# Patient Record
Sex: Female | Born: 1990 | Race: White | Hispanic: No | Marital: Single | Smoking: Never smoker
Health system: Southern US, Community
[De-identification: ages and names within clinical notes are randomized; demographics above are authoritative.]

## PROBLEM LIST (undated history)

## (undated) ENCOUNTER — Inpatient Hospital Stay (HOSPITAL_COMMUNITY): Payer: Self-pay

## (undated) DIAGNOSIS — N390 Urinary tract infection, site not specified: Secondary | ICD-10-CM

## (undated) DIAGNOSIS — F419 Anxiety disorder, unspecified: Secondary | ICD-10-CM

## (undated) DIAGNOSIS — D649 Anemia, unspecified: Secondary | ICD-10-CM

## (undated) DIAGNOSIS — F329 Major depressive disorder, single episode, unspecified: Secondary | ICD-10-CM

## (undated) DIAGNOSIS — F32A Depression, unspecified: Secondary | ICD-10-CM

## (undated) DIAGNOSIS — J45909 Unspecified asthma, uncomplicated: Secondary | ICD-10-CM

## (undated) HISTORY — PX: DILATION AND CURETTAGE OF UTERUS: SHX78

## (undated) HISTORY — DX: Depression, unspecified: F32.A

## (undated) HISTORY — PX: APPENDECTOMY: SHX54

---
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
## 2008-08-06 ENCOUNTER — Inpatient Hospital Stay (HOSPITAL_COMMUNITY): Admission: AD | Admit: 2008-08-06 | Discharge: 2008-08-08 | Payer: Self-pay | Admitting: Obstetrics and Gynecology

## 2008-08-06 ENCOUNTER — Inpatient Hospital Stay (HOSPITAL_COMMUNITY): Admission: AD | Admit: 2008-08-06 | Discharge: 2008-08-06 | Payer: Self-pay | Admitting: Obstetrics and Gynecology

## 2010-03-17 NOTE — L&D Delivery Note (Signed)
Delivery Note At 7:12 PM a viable and healthy female was delivered via Vaginal, Spontaneous Delivery (Presentation: Right Occiput Anterior).  APGAR: 8, 9; weight 7 lb 9 oz (3430 g).   Placenta status: Intact, Spontaneous.  Cord: 3 vessels with the following complications: nuchal cord x 1 reduced.  Anesthesia: Epidural Local  Episiotomy: None Lacerations: 2nd degree Suture Repair: 3.0 vicryl Est. Blood Loss (mL): 400  Mom to postpartum.  Baby to nursery-stable.  Pride Gonzales D 12/07/2010, 7:32 PM

## 2010-05-24 LAB — ABO/RH: RH Type: POSITIVE

## 2010-05-24 LAB — HEPATITIS B SURFACE ANTIGEN: Hepatitis B Surface Ag: NEGATIVE

## 2010-05-24 LAB — HIV ANTIBODY (ROUTINE TESTING W REFLEX): HIV: NONREACTIVE

## 2010-06-25 LAB — CBC
HCT: 25.3 % — ABNORMAL LOW (ref 36.0–46.0)
MCHC: 33 g/dL (ref 30.0–36.0)
MCV: 74.3 fL — ABNORMAL LOW (ref 78.0–100.0)
Platelets: 268 10*3/uL (ref 150–400)
Platelets: 333 10*3/uL (ref 150–400)
RDW: 17.9 % — ABNORMAL HIGH (ref 11.5–15.5)
RDW: 18 % — ABNORMAL HIGH (ref 11.5–15.5)
WBC: 14.1 10*3/uL — ABNORMAL HIGH (ref 4.0–10.5)

## 2010-06-25 LAB — RPR: RPR Ser Ql: NONREACTIVE

## 2010-08-02 NOTE — Discharge Summary (Signed)
NAMEREGGIE, WELGE               ACCOUNT NO.:  [id number]   MEDICAL RECORD NO.:  [id number]          PATIENT TYPE:  INP   LOCATION:  9136                          FACILITY:  [facility]   PHYSICIAN:  Zenaida Niece, M.D.DATE OF BIRTH:  06-14-1990   DATE OF ADMISSION:  08/06/2008  DATE OF DISCHARGE:  08/08/2008                               DISCHARGE SUMMARY   ADMISSION DIAGNOSIS:  Intrauterine pregnancy at 38 weeks.   DISCHARGE DIAGNOSIS:  Intrauterine pregnancy at 38 weeks.   PROCEDURE:  On May 24, she had a spontaneous vaginal delivery.   HISTORY AND PHYSICAL:  This is a 20 year old gravida 2, para 0-0-1-0  with an EGA of 38+ weeks, who presents with a complaint of regular  contractions.  She had been seen earlier in the day on May 23 for  contractions and the cervix was 1 cm dilated without change, and she was  sent home.  She returned a couple hours later with continued  contractions and cervix changed to 3 cm.  Prenatal care uncomplicated.   PRENATAL LABORATORY FINDINGS:  RPR nonreactive, hepatitis B surface  antigen negative, rubella immune, HIV negative, blood type is A+ with a  negative antibody screen, gonorrhea and chlamydia negative, cystic  fibrosis negative, 1-hour Glucola 98, group B strep is negative.   PAST MEDICAL HISTORY:  Significant for anemia and asthma and is  otherwise noncontributory.   PHYSICAL EXAMINATION:  She is afebrile with stable vital signs.  Fetal  heart tracing is category II with moderate variability and variable  decels with contractions and pushing with contractions every 2-3  minutes.  Abdomen, gravid and nontender with an estimated fetal weight  of 7-1/2 pounds.  Cervix on my first exam, she is complete complete and  +1.   HOSPITAL COURSE:  The patient was admitted and continued to progress on  her own.  She received an epidural and had spontaneous rupture of  membranes.  She progressed to complete, pushed well and early on the  morning of  May 24, had a vaginal delivery of a viable female infant with  Apgars of 8 and 9, weight 7 pounds 10 ounces.  Placenta delivered  spontaneous, was intact.  She had a right labial laceration and small  first-degree laceration repaired with 3-0 Vicryl with local block.  Left  labial laceration was hemostatic and not repaired.  Estimated blood loss  was less than 500 mL.  Postpartum, she had no significant complications.  On postpartum day #1, predelivery hemoglobin was 9.9, postdelivery was  8.3.  On postpartum day #1, the patient was requested to discharge home.  The baby was felt to be stable, so she was discharged home.   DISCHARGE INSTRUCTIONS:  Regular diet, pelvic rest.   FOLLOWUP:  In 6 weeks.   MEDICATIONS:  Over-the-counter ibuprofen as needed and Percocet #20 one  to two p.o. q.4-6 h. p.r.n. pain and over-the-counter iron b.i.d. and  she is given our discharge pamphlet.       Zenaida Niece, M.D.  Electronically Signed     TDM/MEDQ  D:  08/18/2008  T:  08/18/2008  Job:  [id number]

## 2010-09-26 LAB — STREP B DNA PROBE: GBS: NEGATIVE

## 2010-10-14 ENCOUNTER — Encounter (HOSPITAL_COMMUNITY): Payer: Self-pay | Admitting: *Deleted

## 2010-10-14 ENCOUNTER — Inpatient Hospital Stay (HOSPITAL_COMMUNITY)
Admission: AD | Admit: 2010-10-14 | Discharge: 2010-10-14 | Disposition: A | Discharge status: Home / Self Care | Payer: BC Managed Care – PPO | Source: Ambulatory Visit | Attending: Obstetrics and Gynecology | Admitting: Obstetrics and Gynecology

## 2010-10-14 DIAGNOSIS — O2340 Unspecified infection of urinary tract in pregnancy, unspecified trimester: Secondary | ICD-10-CM

## 2010-10-14 DIAGNOSIS — O479 False labor, unspecified: Secondary | ICD-10-CM

## 2010-10-14 DIAGNOSIS — R609 Edema, unspecified: Secondary | ICD-10-CM | POA: Insufficient documentation

## 2010-10-14 DIAGNOSIS — N390 Urinary tract infection, site not specified: Secondary | ICD-10-CM | POA: Insufficient documentation

## 2010-10-14 DIAGNOSIS — O9989 Other specified diseases and conditions complicating pregnancy, childbirth and the puerperium: Secondary | ICD-10-CM | POA: Insufficient documentation

## 2010-10-14 DIAGNOSIS — N898 Other specified noninflammatory disorders of vagina: Secondary | ICD-10-CM | POA: Insufficient documentation

## 2010-10-14 DIAGNOSIS — O4700 False labor before 37 completed weeks of gestation, unspecified trimester: Secondary | ICD-10-CM

## 2010-10-14 DIAGNOSIS — R300 Dysuria: Secondary | ICD-10-CM | POA: Insufficient documentation

## 2010-10-14 DIAGNOSIS — O2390 Unspecified genitourinary tract infection in pregnancy, unspecified trimester: Secondary | ICD-10-CM | POA: Insufficient documentation

## 2010-10-14 HISTORY — DX: Anemia, unspecified: D64.9

## 2010-10-14 LAB — URINALYSIS, ROUTINE W REFLEX MICROSCOPIC
Bilirubin Urine: NEGATIVE
Nitrite: NEGATIVE
Specific Gravity, Urine: 1.025 (ref 1.005–1.030)
Urobilinogen, UA: 0.2 mg/dL (ref 0.0–1.0)

## 2010-10-14 LAB — WET PREP, GENITAL
Trich, Wet Prep: NONE SEEN
Yeast Wet Prep HPF POC: NONE SEEN

## 2010-10-14 LAB — URINE MICROSCOPIC-ADD ON

## 2010-10-14 MED ORDER — NITROFURANTOIN MONOHYD MACRO 100 MG PO CAPS: 100.0000 mg | ORAL_CAPSULE | Freq: Two times a day (BID) | ORAL | Status: AC

## 2010-10-14 MED ORDER — NYSTATIN-TRIAMCINOLONE 100000-0.1 UNIT/GM-% EX CREA: TOPICAL_CREAM | Freq: Four times a day (QID) | CUTANEOUS | Status: DC

## 2010-10-14 MED ORDER — TERBUTALINE SULFATE 1 MG/ML IJ SOLN
0.2500 mg | Freq: Once | INTRAMUSCULAR | Status: AC
  Administered 2010-10-14: 0.25 mg via SUBCUTANEOUS
  Filled 2010-10-14: qty 1

## 2010-10-14 MED ORDER — ACETAMINOPHEN 500 MG PO TABS: 1000.0000 mg | ORAL_TABLET | Freq: Once | ORAL | Status: DC

## 2010-10-14 NOTE — ED Provider Notes (Cosign Needed)
Lauren Griffith is a 20 y.o. female at [redacted] weeks gestation presenting for contractions, external burning with urination, vaginal discharge and vulvar edema. Maternal Medical History:  Reason for admission: Reason for admission: contractions.  Contractions: Onset was yesterday.   Frequency: regular.   Duration is approximately 30 seconds.   Perceived severity is mild.    Fetal activity: Perceived fetal activity is normal.   Last perceived fetal movement was within the past hour.    Prenatal Complications - Diabetes: none.    OB History    Grav Para Term Preterm Abortions TAB SAB Ect Mult Living   3 1 1  1 1    1      Past Medical History  Diagnosis Date  . Anemia    Past Surgical History  Procedure Date  . Dilation and curettage of uterus    Family History: family history is not on file. Social History:  reports that she has never smoked. She has never used smokeless tobacco. She reports that she does not drink alcohol or use illicit drugs.  Review of Systems  Genitourinary: Positive for dysuria. Negative for urgency, frequency, hematuria and flank pain.      Blood pressure 103/63, pulse 93, temperature 98.1 F (36.7 C), temperature source Oral, resp. rate 20, height 5\' 2"  (1.575 m), weight 51.438 kg (113 lb 6.4 oz). Maternal Exam:  Uterine Assessment: Contraction strength is mild.  Contraction duration is 40 seconds. Contraction frequency is regular.   Abdomen: Fundal height is S=D.    Introitus: Vulva is positive for vulval edema. Vagina is positive for vaginal discharge and edema.  Pelvis: adequate for delivery.   Cervix: Cervix evaluated by sterile speculum exam and digital exam.   Closed and long  Fetal Exam Fetal Monitor Review: Mode: ultrasound.   Baseline rate: 145.  Variability: moderate (6-25 bpm).   Pattern: accelerations present and no decelerations.    Fetal State Assessment: Category I - tracings are normal.     Physical Exam  Constitutional:  She appears well-developed and well-nourished.  Cardiovascular: Normal rate.   Respiratory: Effort normal.  GI: Soft. There is no tenderness.  Genitourinary: Uterus normal. There is tenderness on the right labia. There is no lesion on the right labia. There is tenderness on the left labia. There is no lesion on the left labia. Uterus is not tender. Cervix exhibits discharge. Cervix exhibits no friability. There is erythema and tenderness around the vagina. No bleeding around the vagina. No signs of injury around the vagina. Vaginal discharge found.   UA: SG 1.025, mod LE, otherwise neg  Cervix: closed and long Prenatal labs: ABO, Rh:   Antibody:   Rubella:   RPR:    HBsAg:    HIV:    GBS:     Assessment/Plan: Assessment: 1. Preterm UC's w/ no cervical change 2. FHR category 1 3. Vulvar edema and irritation  4. Possible UTI  Plan: 1. Wet Prep, fFN, urine culture, terb 0.25 [place] per consult w/ Dr. Marlana Salvage, Hersey Maclellan 10/14/2010, 9:46 AM

## 2010-10-14 NOTE — Progress Notes (Signed)
Started have contractions last night at 1100. Noted swelling 'down there' yesterday, burns really bad when urinates.  Uterine contractions have stopped, but still has back pain

## 2010-10-14 NOTE — Progress Notes (Signed)
Pt states thinks she has uti, burns and smells with voiding. Has yellow thick vaginal d/c, mild odor. +FM, denies bleeding.

## 2010-10-14 NOTE — ED Provider Notes (Cosign Needed)
fFN neg Microscopic wet-mount exam shows many WBC's UC's rare, mild after terb  D/C home per consult w/ Dr. Ambrose Mantle Rx Macrobid and Micolog II

## 2010-10-15 LAB — URINE CULTURE: Special Requests: NORMAL

## 2010-11-25 ENCOUNTER — Encounter (HOSPITAL_COMMUNITY): Payer: Self-pay | Admitting: *Deleted

## 2010-11-25 ENCOUNTER — Inpatient Hospital Stay (HOSPITAL_COMMUNITY)
Admission: AD | Admit: 2010-11-25 | Discharge: 2010-11-25 | Disposition: A | Discharge status: Home / Self Care | Payer: BC Managed Care – PPO | Source: Ambulatory Visit | Attending: Obstetrics and Gynecology | Admitting: Obstetrics and Gynecology

## 2010-11-25 DIAGNOSIS — O4700 False labor before 37 completed weeks of gestation, unspecified trimester: Secondary | ICD-10-CM | POA: Insufficient documentation

## 2010-11-25 NOTE — ED Provider Notes (Signed)
SSE performed secondary to ? ROM  Negative pooling, Neg fern  Cvx: 1.5/50/vtx/-2, no fluid return with exam  FHR: 130, mod variability, +15x15 Toco: 2-5 mins, mild to palp  Caron Ode E. 11/24/10 @ 0750

## 2010-11-25 NOTE — Progress Notes (Signed)
Started 0230 ctx's 4-6, no bleeding, fluid coming out

## 2010-12-02 ENCOUNTER — Encounter (HOSPITAL_COMMUNITY): Payer: Self-pay | Admitting: *Deleted

## 2010-12-02 ENCOUNTER — Inpatient Hospital Stay (HOSPITAL_COMMUNITY)
Admission: AD | Admit: 2010-12-02 | Discharge: 2010-12-02 | Disposition: A | Discharge status: Home / Self Care | Payer: BC Managed Care – PPO | Source: Ambulatory Visit | Attending: Obstetrics and Gynecology | Admitting: Obstetrics and Gynecology

## 2010-12-02 DIAGNOSIS — O479 False labor, unspecified: Secondary | ICD-10-CM | POA: Insufficient documentation

## 2010-12-02 DIAGNOSIS — O471 False labor at or after 37 completed weeks of gestation: Secondary | ICD-10-CM

## 2010-12-02 HISTORY — DX: Urinary tract infection, site not specified: N39.0

## 2010-12-02 NOTE — Progress Notes (Signed)
Has a strange pain started last night, rt side upper abd- uterus, where baby  Is, worse with some movement.  Has had bloody mucous since last Mon. ? Watery d/c mid week- pt states when changes pad, it is really wet.   Contractions are inconsistant, any where from a 2 to a 10

## 2010-12-02 NOTE — ED Provider Notes (Signed)
Lauren Griffith is a 20 y.o. G3P1011 at @37  wks  Chief Complaint  Patient presents with  . Contractions   Subjective:  Patient is here for labor check and reports having wetness in her underwear for the past several days. Denies any gush of fluid. At her last visit cervix exam was 2 cm dilated. She has had some bloody show. Good fetal movement. No urinary symptoms.  Pregnancy course uncomplicated. Past Medical History  Diagnosis Date  . Anemia   . Urinary tract infection    Past Surgical History  Procedure Date  . Dilation and curettage of uterus    History   Social History  . Marital Status: Married    Spouse Name: N/A    Number of Children: N/A  . Years of Education: N/A   Occupational History  . Not on file.   Social History Main Topics  . Smoking status: Never Smoker   . Smokeless tobacco: Never Used  . Alcohol Use: No  . Drug Use: No  . Sexually Active: Yes   Other Topics Concern  . Not on file   Social History Narrative  . No narrative on file   Filed Vitals:   12/02/10 1139  BP: 114/66  Pulse: 80  Temp:   Resp: 18    Electronic fetal monitor: Contractions q. 3-6 minutes mild irregular. Fetal heart rate 120 baseline reactive moderate variability and no decelerations.  Abdomen soft and nontender  Pelvic: Normal external female genitalia: Vagina with physiologic white discharge Fern test is negative. Vaginal exam: Posterior 2/50/-2  Assessment:  G2 P1 at 37 weeks with prodromal contractions not in established labor. Fetal heart rate reassuring. No evidence of ruptured membranes.  Plan:  Discharge home with labor precautions

## 2010-12-02 NOTE — Progress Notes (Signed)
Pt states she was having contractions every 6 minutes before arriving. Now have spaced out. No leaking but has had bloody show for one week. Reports good fetal movement.

## 2010-12-07 ENCOUNTER — Encounter (HOSPITAL_COMMUNITY): Payer: Self-pay | Admitting: *Deleted

## 2010-12-07 ENCOUNTER — Inpatient Hospital Stay (HOSPITAL_COMMUNITY): Payer: BC Managed Care – PPO | Admitting: Anesthesiology

## 2010-12-07 ENCOUNTER — Inpatient Hospital Stay (HOSPITAL_COMMUNITY)
Admission: AD | Admit: 2010-12-07 | Discharge: 2010-12-09 | DRG: 373 | Disposition: A | Discharge status: Home / Self Care | Payer: BC Managed Care – PPO | Source: Ambulatory Visit | Attending: Obstetrics and Gynecology | Admitting: Obstetrics and Gynecology

## 2010-12-07 ENCOUNTER — Encounter (HOSPITAL_COMMUNITY): Payer: Self-pay | Admitting: Anesthesiology

## 2010-12-07 DIAGNOSIS — IMO0001 Reserved for inherently not codable concepts without codable children: Secondary | ICD-10-CM

## 2010-12-07 LAB — CBC
MCH: 20.9 pg — ABNORMAL LOW (ref 26.0–34.0)
MCHC: 29.2 g/dL — ABNORMAL LOW (ref 30.0–36.0)
Platelets: 240 10*3/uL (ref 150–400)

## 2010-12-07 MED ORDER — OXYCODONE-ACETAMINOPHEN 5-325 MG/5ML PO SOLN
5.0000 mL | ORAL | Status: DC | PRN
  Administered 2010-12-07: 5 mL via ORAL
  Filled 2010-12-07: qty 5

## 2010-12-07 MED ORDER — OXYCODONE-ACETAMINOPHEN 5-325 MG PO TABS: 2.0000 | ORAL_TABLET | ORAL | Status: DC | PRN

## 2010-12-07 MED ORDER — PHENYLEPHRINE 40 MCG/ML (10ML) SYRINGE FOR IV PUSH (FOR BLOOD PRESSURE SUPPORT)
80.0000 ug | PREFILLED_SYRINGE | INTRAVENOUS | Status: DC | PRN
  Filled 2010-12-07: qty 5

## 2010-12-07 MED ORDER — LIDOCAINE HCL (PF) 1 % IJ SOLN
INTRAMUSCULAR | Status: AC
  Filled 2010-12-07: qty 30

## 2010-12-07 MED ORDER — CITRIC ACID-SODIUM CITRATE 334-500 MG/5ML PO SOLN: 30.0000 mL | ORAL | Status: DC | PRN

## 2010-12-07 MED ORDER — OXYTOCIN 20 UNITS IN LACTATED RINGERS INFUSION - SIMPLE
INTRAVENOUS | Status: AC
  Filled 2010-12-07: qty 1000

## 2010-12-07 MED ORDER — ONDANSETRON HCL 4 MG/2ML IJ SOLN: 4.0000 mg | Freq: Four times a day (QID) | INTRAMUSCULAR | Status: DC | PRN

## 2010-12-07 MED ORDER — LIDOCAINE HCL 1.5 % IJ SOLN
INTRAMUSCULAR | Status: DC | PRN
  Administered 2010-12-07: 2 mL via EPIDURAL
  Administered 2010-12-07 (×2): 5 mL via EPIDURAL

## 2010-12-07 MED ORDER — FENTANYL 2.5 MCG/ML BUPIVACAINE 1/10 % EPIDURAL INFUSION (WH - ANES)
14.0000 mL/h | INTRAMUSCULAR | Status: DC
  Administered 2010-12-07: 14 mL/h via EPIDURAL

## 2010-12-07 MED ORDER — LACTATED RINGERS IV SOLN: 500.0000 mL | INTRAVENOUS | Status: DC | PRN

## 2010-12-07 MED ORDER — LACTATED RINGERS IV SOLN: 500.0000 mL | Freq: Once | INTRAVENOUS | Status: DC

## 2010-12-07 MED ORDER — IBUPROFEN 100 MG/5ML PO SUSP
600.0000 mg | Freq: Four times a day (QID) | ORAL | Status: DC
  Administered 2010-12-07: 600 mg via ORAL
  Filled 2010-12-07 (×8): qty 30

## 2010-12-07 MED ORDER — PHENYLEPHRINE 40 MCG/ML (10ML) SYRINGE FOR IV PUSH (FOR BLOOD PRESSURE SUPPORT)
PREFILLED_SYRINGE | INTRAVENOUS | Status: AC
  Filled 2010-12-07: qty 5

## 2010-12-07 MED ORDER — LIDOCAINE HCL (PF) 1 % IJ SOLN: 30.0000 mL | INTRAMUSCULAR | Status: DC | PRN

## 2010-12-07 MED ORDER — OXYTOCIN BOLUS FROM INFUSION
500.0000 mL | Freq: Once | INTRAVENOUS | Status: DC
  Filled 2010-12-07: qty 500

## 2010-12-07 MED ORDER — EPHEDRINE 5 MG/ML INJ
10.0000 mg | INTRAVENOUS | Status: DC | PRN
  Filled 2010-12-07: qty 4

## 2010-12-07 MED ORDER — IBUPROFEN 600 MG PO TABS: 600.0000 mg | ORAL_TABLET | Freq: Four times a day (QID) | ORAL | Status: DC | PRN

## 2010-12-07 MED ORDER — DIPHENHYDRAMINE HCL 50 MG/ML IJ SOLN: 12.5000 mg | INTRAMUSCULAR | Status: DC | PRN

## 2010-12-07 MED ORDER — ACETAMINOPHEN 325 MG PO TABS: 650.0000 mg | ORAL_TABLET | ORAL | Status: DC | PRN

## 2010-12-07 MED ORDER — LACTATED RINGERS IV SOLN: INTRAVENOUS | Status: DC

## 2010-12-07 MED ORDER — EPHEDRINE 5 MG/ML INJ
INTRAVENOUS | Status: AC
  Filled 2010-12-07: qty 4

## 2010-12-07 MED ORDER — LACTATED RINGERS IV BOLUS (SEPSIS)
1000.0000 mL | Freq: Once | INTRAVENOUS | Status: AC
  Administered 2010-12-07: 1000 mL via INTRAVENOUS

## 2010-12-07 MED ORDER — FENTANYL 2.5 MCG/ML BUPIVACAINE 1/10 % EPIDURAL INFUSION (WH - ANES)
INTRAMUSCULAR | Status: AC
  Filled 2010-12-07: qty 60

## 2010-12-07 MED ORDER — OXYTOCIN 20 UNITS IN LACTATED RINGERS INFUSION - SIMPLE
125.0000 mL/h | Freq: Once | INTRAVENOUS | Status: DC
  Administered 2010-12-07: 125 mL/h via INTRAVENOUS

## 2010-12-07 NOTE — Progress Notes (Signed)
Contractions since last night, intense over past 2 hours, G2P1 37 weeks,

## 2010-12-07 NOTE — Anesthesia Procedure Notes (Signed)
Epidural Patient location during procedure: OB Start time: 12/07/2010 5:49 PM Reason for block: procedure for pain  Staffing Performed by: anesthesiologist   Preanesthetic Checklist Completed: patient identified, site marked, surgical consent, pre-op evaluation, timeout performed, IV checked, risks and benefits discussed and monitors and equipment checked  Epidural Patient position: sitting Prep: site prepped and draped and DuraPrep Patient monitoring: continuous pulse ox and blood pressure Approach: midline Injection technique: LOR air  Needle:  Needle type: Tuohy  Needle gauge: 17 G Needle length: 9 cm Catheter type: closed end flexible Catheter size: 19 Gauge Test dose: negative  Assessment Events: blood not aspirated, injection not painful, no injection resistance, negative IV test and no paresthesia  Additional Notes Patient presents in rapid labor, 8 cm dilated.

## 2010-12-07 NOTE — Progress Notes (Signed)
VE- Rim/C/0, vtx, AROM clear Will start pushing soon.

## 2010-12-07 NOTE — Anesthesia Preprocedure Evaluation (Signed)
Anesthesia Evaluation  Name, MR# and DOB Patient awake  General Assessment Comment  Reviewed: Allergy & Precautions, H&P , NPO status , Patient's Chart, lab work & pertinent test results, reviewed documented beta blocker date and time   History of Anesthesia Complications Negative for: history of anesthetic complications  Airway Mallampati: II TM Distance: >3 FB Neck ROM: full    Dental  (+) Teeth Intact   Pulmonary  clear to auscultation  breath sounds clear to auscultation none    Cardiovascular regular Normal    Neuro/Psych Negative Neurological ROS  Negative Psych ROS  GI/Hepatic/Renal negative GI ROS  negative Liver ROS  negative Renal ROS        Endo/Other  Negative Endocrine ROS (+)      Abdominal   Musculoskeletal   Hematology negative hematology ROS (+)   Peds  Reproductive/Obstetrics (+) Pregnancy    Anesthesia Other Findings             Anesthesia Physical Anesthesia Plan  ASA: II  Anesthesia Plan: Epidural   Post-op Pain Management:    Induction:   Airway Management Planned:   Additional Equipment:   Intra-op Plan:   Post-operative Plan:   Informed Consent: I have reviewed the patients History and Physical, chart, labs and discussed the procedure including the risks, benefits and alternatives for the proposed anesthesia with the patient or authorized representative who has indicated his/her understanding and acceptance.     Plan Discussed with:   Anesthesia Plan Comments:         Anesthesia Quick Evaluation  

## 2010-12-07 NOTE — H&P (Signed)
Lauren Griffith is a 20 y.o. female presenting for regular ctx.  On evaluation in MAU, cervix changed from 2-3 cm to 4 cm, and then to 7 cm once she got to L&D.  PNC essentially uncomplicated, see prenatal records for complete history.  Maternal Medical History:  Reason for admission: Reason for admission: contractions.  Contractions: Frequency: regular.   Perceived severity is strong.    Fetal activity: Perceived fetal activity is normal.    Prenatal complications: no prenatal complications   OB History    Grav Para Term Preterm Abortions TAB SAB Ect Mult Living   3 1 1  1 1    1      Past Medical History  Diagnosis Date  . Anemia   . Urinary tract infection    Past Surgical History  Procedure Date  . Dilation and curettage of uterus    Family History: family history is not on file. Social History:  reports that she has never smoked. She has never used smokeless tobacco. She reports that she does not drink alcohol or use illicit drugs.  Review of Systems  Respiratory: Negative.   Cardiovascular: Negative.     Dilation: 8 Effacement (%): 100 Station: 0 Exam by:: m early rnc-ob Blood pressure 133/71, pulse 126, temperature 97.1 F (36.2 C), temperature source Oral, resp. rate 20, height 5\' 2"  (1.575 m), weight 56.246 kg (124 lb), SpO2 100.00%.  FHT- Category I with ctx q 3 minutes  Maternal Exam:  Uterine Assessment: Contraction strength is firm.  Contraction frequency is regular.   Abdomen: Patient reports no abdominal tenderness. Estimated fetal weight is 7 1/2 lbs.   Fetal presentation: vertex  Introitus: Normal vulva. Normal vagina.    Physical Exam  Constitutional: She appears well-developed and well-nourished.  Neck: Neck supple. No thyromegaly present.  Cardiovascular: Normal rate, regular rhythm and normal heart sounds.   No murmur heard. Respiratory: Breath sounds normal. No respiratory distress.  GI: Soft.    Prenatal labs: ABO, Rh: A/Positive/--  (03/09 0000) Antibody: Negative (03/09 0000) Rubella:  Immune RPR: Nonreactive (03/09 0000)  HBsAg: Negative (03/09 0000)  HIV: Non-reactive (03/09 0000)  GBS: Negative (07/12 0000)   Assessment/Plan: IUP at 37 weeks in active labor.  Receiving epidural, anticipate SVD.     Rain Wilhide D 12/07/2010, 6:08 PM

## 2010-12-08 MED ORDER — PRENATAL PLUS 27-1 MG PO TABS: 1.0000 | ORAL_TABLET | Freq: Every day | ORAL | Status: DC

## 2010-12-08 MED ORDER — DIPHENHYDRAMINE HCL 25 MG PO CAPS: 25.0000 mg | ORAL_CAPSULE | Freq: Four times a day (QID) | ORAL | Status: DC | PRN

## 2010-12-08 MED ORDER — LANOLIN HYDROUS EX OINT: TOPICAL_OINTMENT | CUTANEOUS | Status: DC | PRN

## 2010-12-08 MED ORDER — DIBUCAINE 1 % RE OINT: 1.0000 "application " | TOPICAL_OINTMENT | RECTAL | Status: DC | PRN

## 2010-12-08 MED ORDER — TETANUS-DIPHTH-ACELL PERTUSSIS 5-2.5-18.5 LF-MCG/0.5 IM SUSP: 0.5000 mL | Freq: Once | INTRAMUSCULAR | Status: DC

## 2010-12-08 MED ORDER — SENNOSIDES-DOCUSATE SODIUM 8.6-50 MG PO TABS
2.0000 | ORAL_TABLET | Freq: Every day | ORAL | Status: DC
  Administered 2010-12-08 (×2): 2 via ORAL

## 2010-12-08 MED ORDER — BENZOCAINE-MENTHOL 20-0.5 % EX AERO
1.0000 "application " | INHALATION_SPRAY | CUTANEOUS | Status: DC | PRN
  Administered 2010-12-08: 1 via TOPICAL

## 2010-12-08 MED ORDER — ONDANSETRON HCL 4 MG PO TABS: 4.0000 mg | ORAL_TABLET | ORAL | Status: DC | PRN

## 2010-12-08 MED ORDER — MAGNESIUM HYDROXIDE 400 MG/5ML PO SUSP: 30.0000 mL | ORAL | Status: DC | PRN

## 2010-12-08 MED ORDER — BENZOCAINE-MENTHOL 20-0.5 % EX AERO
INHALATION_SPRAY | CUTANEOUS | Status: AC
  Administered 2010-12-08: 1 via TOPICAL
  Filled 2010-12-08: qty 56

## 2010-12-08 MED ORDER — MEASLES, MUMPS & RUBELLA VAC ~~LOC~~ INJ
0.5000 mL | INJECTION | Freq: Once | SUBCUTANEOUS | Status: DC
  Filled 2010-12-08: qty 0.5

## 2010-12-08 MED ORDER — WITCH HAZEL-GLYCERIN EX PADS: 1.0000 "application " | MEDICATED_PAD | CUTANEOUS | Status: DC | PRN

## 2010-12-08 MED ORDER — OXYCODONE-ACETAMINOPHEN 5-325 MG PO TABS
1.0000 | ORAL_TABLET | ORAL | Status: DC | PRN
  Administered 2010-12-08 – 2010-12-09 (×7): 1 via ORAL
  Filled 2010-12-08 (×7): qty 1

## 2010-12-08 MED ORDER — IBUPROFEN 600 MG PO TABS
600.0000 mg | ORAL_TABLET | Freq: Four times a day (QID) | ORAL | Status: DC
  Administered 2010-12-08 – 2010-12-09 (×6): 600 mg via ORAL
  Filled 2010-12-08 (×6): qty 1

## 2010-12-08 MED ORDER — PRENATAL PLUS 27-1 MG PO TABS
1.0000 | ORAL_TABLET | Freq: Every day | ORAL | Status: DC
  Administered 2010-12-08 – 2010-12-09 (×2): 1 via ORAL
  Filled 2010-12-08 (×2): qty 1

## 2010-12-08 MED ORDER — ONDANSETRON HCL 4 MG/2ML IJ SOLN: 4.0000 mg | INTRAMUSCULAR | Status: DC | PRN

## 2010-12-08 MED ORDER — METHYLERGONOVINE MALEATE 0.2 MG PO TABS: 0.2000 mg | ORAL_TABLET | ORAL | Status: DC | PRN

## 2010-12-08 MED ORDER — ZOLPIDEM TARTRATE 5 MG PO TABS: 5.0000 mg | ORAL_TABLET | Freq: Every evening | ORAL | Status: DC | PRN

## 2010-12-08 MED ORDER — SIMETHICONE 80 MG PO CHEW: 80.0000 mg | CHEWABLE_TABLET | ORAL | Status: DC | PRN

## 2010-12-08 MED ORDER — METHYLERGONOVINE MALEATE 0.2 MG/ML IJ SOLN: 0.2000 mg | INTRAMUSCULAR | Status: DC | PRN

## 2010-12-08 NOTE — Progress Notes (Signed)
PPD#1 Doing well Afeb, VSS Fundus- firm, NT at U-1 Continue routine postpartum care

## 2010-12-08 NOTE — Anesthesia Postprocedure Evaluation (Signed)
Anesthesia Post Note  Patient: Lauren Griffith [facility]  Procedure(s) Performed: * No procedures listed *  Anesthesia type: Epidural  Patient location: Mother/Baby  Post pain: Pain level controlled  Post assessment: Post-op Vital signs reviewed  Last Vitals:  Filed Vitals:   12/08/10 0430  BP: 88/56  Pulse: 79  Temp: 99 F (37.2 C)  Resp: 18    Post vital signs: Reviewed  Level of consciousness: awake  Complications: No apparent anesthesia complications

## 2010-12-09 MED ORDER — OXYCODONE-ACETAMINOPHEN 5-325 MG PO TABS: 1.0000 | ORAL_TABLET | ORAL | Status: AC | PRN

## 2010-12-09 NOTE — Progress Notes (Signed)
PPD#2 A little more sore today, ready to go home Afeb, VSS Fundus- firm, NT Discharge home

## 2010-12-09 NOTE — Discharge Summary (Signed)
Obstetric Discharge Summary Reason for Admission: onset of labor Prenatal Procedures: none Intrapartum Procedures: spontaneous vaginal delivery Postpartum Procedures: none Complications-Operative and Postpartum: 2nd degree perineal laceration Hemoglobin  Date Value Range Status  12/07/2010 8.2* 12.0-15.0 (g/dL) Final     HCT  Date Value Range Status  12/07/2010 28.1* 36.0-46.0 (%) Final    Discharge Diagnoses: Term Pregnancy-delivered  Discharge Information: Date: 12/09/2010 Activity: pelvic rest Diet: routine Medications: Ibuprofen and Percocet Condition: stable Instructions: refer to practice specific booklet Discharge to: home Follow-up Information    Follow up with Jericho Alcorn D. Make an appointment in 6 weeks.   Contact information:   [address] [place] [address] [phone]          Newborn Data: Live born female  Birth Weight: 7 lb 9 oz (3430 g) APGAR: 8, 9  Home with mother.  Nayquan Evinger D 12/09/2010, 8:41 AM

## 2012-10-11 DIAGNOSIS — F419 Anxiety disorder, unspecified: Secondary | ICD-10-CM | POA: Insufficient documentation

## 2013-01-10 DIAGNOSIS — J302 Other seasonal allergic rhinitis: Secondary | ICD-10-CM | POA: Insufficient documentation

## 2013-03-17 NOTE — L&D Delivery Note (Signed)
Delivery Note At 2:47 AM a viable and healthy female was delivered via Vaginal, Spontaneous Delivery (Presentation: Right Occiput Anterior).  APGAR: 8 , 9; weight pending.   Placenta status: Intact, Spontaneous.  Cord: 3 vessels   Anesthesia: Epidural  Episiotomy: None Lacerations: 2nd degree;Perineal Suture Repair: 3.0 vicryl Est. Blood Loss (mL): 250 cc  Mom to postpartum.  Baby to Couplet care / Skin to Skin.  Suezette Lafave H. 10/24/2013, 3:10 AM

## 2013-03-30 LAB — OB RESULTS CONSOLE GC/CHLAMYDIA
Chlamydia: NEGATIVE
GC PROBE AMP, GENITAL: NEGATIVE

## 2013-04-18 LAB — OB RESULTS CONSOLE RUBELLA ANTIBODY, IGM: Rubella: IMMUNE

## 2013-04-18 LAB — OB RESULTS CONSOLE ANTIBODY SCREEN: ANTIBODY SCREEN: NEGATIVE

## 2013-04-18 LAB — OB RESULTS CONSOLE RPR: RPR: NONREACTIVE

## 2013-04-18 LAB — OB RESULTS CONSOLE ABO/RH: RH Type: POSITIVE

## 2013-04-18 LAB — OB RESULTS CONSOLE HIV ANTIBODY (ROUTINE TESTING): HIV: NONREACTIVE

## 2013-04-18 LAB — OB RESULTS CONSOLE HEPATITIS B SURFACE ANTIGEN: Hepatitis B Surface Ag: NEGATIVE

## 2013-05-17 ENCOUNTER — Encounter (HOSPITAL_COMMUNITY): Payer: Self-pay | Admitting: Emergency Medicine

## 2013-05-17 ENCOUNTER — Inpatient Hospital Stay (HOSPITAL_COMMUNITY)
Admission: AD | Admit: 2013-05-17 | Discharge: 2013-05-17 | Disposition: A | Discharge status: Home / Self Care | Payer: BC Managed Care – PPO | Source: Ambulatory Visit | Attending: Obstetrics & Gynecology | Admitting: Obstetrics & Gynecology

## 2013-05-17 ENCOUNTER — Emergency Department (HOSPITAL_COMMUNITY)
Admission: EM | Admit: 2013-05-17 | Discharge: 2013-05-17 | Discharge status: Against Medical Advice | Payer: BC Managed Care – PPO | Source: Home / Self Care

## 2013-05-17 ENCOUNTER — Encounter (HOSPITAL_COMMUNITY): Payer: Self-pay | Admitting: *Deleted

## 2013-05-17 DIAGNOSIS — T50905A Adverse effect of unspecified drugs, medicaments and biological substances, initial encounter: Secondary | ICD-10-CM

## 2013-05-17 DIAGNOSIS — T887XXA Unspecified adverse effect of drug or medicament, initial encounter: Secondary | ICD-10-CM

## 2013-05-17 DIAGNOSIS — O9989 Other specified diseases and conditions complicating pregnancy, childbirth and the puerperium: Secondary | ICD-10-CM | POA: Insufficient documentation

## 2013-05-17 DIAGNOSIS — O219 Vomiting of pregnancy, unspecified: Secondary | ICD-10-CM

## 2013-05-17 DIAGNOSIS — J019 Acute sinusitis, unspecified: Secondary | ICD-10-CM

## 2013-05-17 DIAGNOSIS — J329 Chronic sinusitis, unspecified: Secondary | ICD-10-CM | POA: Insufficient documentation

## 2013-05-17 DIAGNOSIS — O210 Mild hyperemesis gravidarum: Secondary | ICD-10-CM

## 2013-05-17 DIAGNOSIS — O99891 Other specified diseases and conditions complicating pregnancy: Secondary | ICD-10-CM | POA: Insufficient documentation

## 2013-05-17 LAB — URINALYSIS, ROUTINE W REFLEX MICROSCOPIC
Bilirubin Urine: NEGATIVE
GLUCOSE, UA: NEGATIVE mg/dL
Hgb urine dipstick: NEGATIVE
KETONES UR: 15 mg/dL — AB
NITRITE: NEGATIVE
PH: 6.5 (ref 5.0–8.0)
Protein, ur: NEGATIVE mg/dL
Specific Gravity, Urine: 1.02 (ref 1.005–1.030)
Urobilinogen, UA: 0.2 mg/dL (ref 0.0–1.0)

## 2013-05-17 LAB — URINE MICROSCOPIC-ADD ON

## 2013-05-17 MED ORDER — AZITHROMYCIN 250 MG PO TABS: ORAL_TABLET | ORAL | Status: AC

## 2013-05-17 MED ORDER — ONDANSETRON 8 MG PO TBDP
8.0000 mg | ORAL_TABLET | Freq: Once | ORAL | Status: AC
  Administered 2013-05-17: 8 mg via ORAL
  Filled 2013-05-17: qty 1

## 2013-05-17 MED ORDER — ONDANSETRON 4 MG PO TBDP: 8.0000 mg | ORAL_TABLET | Freq: Once | ORAL | Status: DC

## 2013-05-17 NOTE — MAU Provider Note (Signed)
Chief Complaint: Emesis  First Provider Initiated Contact with Patient 05/17/13 2035     SUBJECTIVE HPI: Lauren Griffith is a 23 y.o. [id number] at [redacted]w[redacted]d by LMP who presents with N and vomiting ~6 x since last night. Had mild N/V pregnancy earlier in pregnancy, but none x a few weeks. Started Amox last night for sinus infection that started 4 days ago. No Hx of similar reaction to Amox in past, but never too high (1000 mg) dose before. Denies fever, chills, diarrhea. abd pain, VB, sick contacts. Able to keep down fluids, but no food today. Has Phenergan and Zofran at home, but did not take.   Past Medical History  Diagnosis Date  . Anemia   . Urinary tract infection    OB History  Gravida Para Term Preterm AB SAB TAB Ectopic Multiple Living  4 2 2  1  1   2     # Outcome Date GA Lbr Len/2nd Weight Sex Delivery Anes PTL Lv  4 CUR           3 TRM 12/07/10 [redacted]w[redacted]d 01:54 / 01:10 3.43 kg (7 lb 9 oz) F SVD EPI  Y  2 TRM 08/07/08    F SVD EPI N   1 TAB              Past Surgical History  Procedure Laterality Date  . Dilation and curettage of uterus     History   Social History  . Marital Status: Married    Spouse Name: N/A    Number of Children: N/A  . Years of Education: N/A   Occupational History  . Not on file.   Social History Main Topics  . Smoking status: Never Smoker   . Smokeless tobacco: Never Used  . Alcohol Use: No  . Drug Use: No  . Sexual Activity: Yes   Other Topics Concern  . Not on file   Social History Narrative  . No narrative on file   No current facility-administered medications on file prior to encounter.   Current Outpatient Prescriptions on File Prior to Encounter  Medication Sig Dispense Refill  . diphenhydrAMINE (BENADRYL) 25 mg capsule Take 50 mg by mouth every 6 (six) hours as needed for allergies or sleep. Allergies      . prenatal vitamin w/FE, FA (PRENATAL 1 + 1) 27-1 MG TABS Take 1 tablet by mouth daily.        Allergies  Allergen  Reactions  . Amoxicillin Nausea And Vomiting  . Cinnamon Swelling and Other (See Comments)    Mouth swelling. Pt states it is a minor allergy    ROS: Pertinent items in HPI  OBJECTIVE Blood pressure 103/67, pulse 82, temperature 97.7 F (36.5 C), temperature source Oral, resp. rate 18, height 5\' 3"  (1.6 m), weight 49.896 kg (110 lb), SpO2 98.00%, unknown if currently breastfeeding. GENERAL: Well-developed, well-nourished female in no acute distress.  HEENT: Normocephalic. Mucus membranes moist. Sinusus congested. Rhinorrhea present.  HEART: normal rate RESP: normal effort ABDOMEN: Soft, non-tender EXTREMITIES: Nontender, no edema NEURO: Alert and oriented SPECULUM EXAM: Deferred FHR 155 by doppler.   LAB RESULTS Results for orders placed during the hospital encounter of 05/17/13 (from the past 24 hour(s))  URINALYSIS, ROUTINE W REFLEX MICROSCOPIC     Status: Abnormal   Collection Time    05/17/13  7:50 PM      Result Value Ref Range   Color, Urine YELLOW  YELLOW   APPearance HAZY (*) CLEAR  Specific Gravity, Urine 1.020  1.005 - 1.030   pH 6.5  5.0 - 8.0   Glucose, UA NEGATIVE  NEGATIVE mg/dL   Hgb urine dipstick NEGATIVE  NEGATIVE   Bilirubin Urine NEGATIVE  NEGATIVE   Ketones, ur 15 (*) NEGATIVE mg/dL   Protein, ur NEGATIVE  NEGATIVE mg/dL   Urobilinogen, UA 0.2  0.0 - 1.0 mg/dL   Nitrite NEGATIVE  NEGATIVE   Leukocytes, UA TRACE (*) NEGATIVE  URINE MICROSCOPIC-ADD ON     Status: Abnormal   Collection Time    05/17/13  7:50 PM      Result Value Ref Range   Squamous Epithelial / LPF FEW (*) RARE   WBC, UA 0-2  <3 WBC/hpf   RBC / HPF 0-2  <3 RBC/hpf   Bacteria, UA FEW (*) RARE   Urine-Other MUCOUS PRESENT      IMAGING No results found.  MAU COURSE Zofran ODT given. Asking for Sprite. Recommend waiting for Zofran to work first.   Feeling much better. Tolerating POs. Will switch ABX to Z-pack per consutation w/ Dr Mora ApplPinn.   ASSESSMENT 1. Medication reaction    2. Vomiting complicating pregnancy, antepartum   3. Sinusitis, acute     PLAN Discharge home in stable condition per consult w/ Dr. Mora ApplPinn. Sinus infection comfort measures, Neti Pot, increase fluids and rest.      Follow-up Information   Follow up with Almon HerculesOSS,KENDRA H., MD In 1 week. (or , As needed, If symptoms worsen)    Specialty:  Obstetrics and Gynecology   Contact information:   [address] [place] [address] [phone]       [facility] with [facility]. (As needed in emergencies)    Contact information:   [address] [id number] [place] [address] [phone]       Medication List         azithromycin 250 MG tablet  Commonly known as:  ZITHROMAX Z-PAK  Take 2 tablets (500 mg) on  Day 1,  followed by 1 tablet (250 mg) once daily on Days 2 through 5.     citalopram 20 MG tablet  Commonly known as:  CELEXA  Take 20 mg by mouth daily.     diphenhydrAMINE 25 mg capsule  Commonly known as:  BENADRYL  Take 50 mg by mouth every 6 (six) hours as needed for allergies or sleep. Allergies     prenatal vitamin w/FE, FA 27-1 MG Tabs tablet  Take 1 tablet by mouth daily.     pseudoephedrine-acetaminophen 30-500 MG Tabs  Commonly known as:  TYLENOL SINUS  Take 2 tablets by mouth every 4 (four) hours as needed (congestion).       [place] Cherica Griffith, [place] 05/17/2013  10:38 PM

## 2013-05-17 NOTE — MAU Note (Signed)
Pt reports she was seen at MD office yesterday and given amoxicillin for sinus infection and began vomiting last pm.

## 2013-05-17 NOTE — Discharge Instructions (Signed)
Nausea and Vomiting Nausea is a sick feeling that often comes before throwing up (vomiting). Vomiting is a reflex where stomach contents come out of your mouth. Vomiting can cause severe loss of body fluids (dehydration). Children and elderly adults can become dehydrated quickly, especially if they also have diarrhea. Nausea and vomiting are symptoms of a condition or disease. It is important to find the cause of your symptoms. CAUSES   Direct irritation of the stomach lining. This irritation can result from increased acid production (gastroesophageal reflux disease), infection, food poisoning, taking certain medicines (such as nonsteroidal anti-inflammatory drugs), alcohol use, or tobacco use.  Signals from the brain.These signals could be caused by a headache, heat exposure, an inner ear disturbance, increased pressure in the brain from injury, infection, a tumor, or a concussion, pain, emotional stimulus, or metabolic problems.  An obstruction in the gastrointestinal tract (bowel obstruction).  Illnesses such as diabetes, hepatitis, gallbladder problems, appendicitis, kidney problems, cancer, sepsis, atypical symptoms of a heart attack, or eating disorders.  Medical treatments such as chemotherapy and radiation.  Receiving medicine that makes you sleep (general anesthetic) during surgery. DIAGNOSIS Your caregiver may ask for tests to be done if the problems do not improve after a few days. Tests may also be done if symptoms are severe or if the reason for the nausea and vomiting is not clear. Tests may include:  Urine tests.  Blood tests.  Stool tests.  Cultures (to look for evidence of infection).  X-rays or other imaging studies. Test results can help your caregiver make decisions about treatment or the need for additional tests. TREATMENT You need to stay well hydrated. Drink frequently but in small amounts.You may wish to drink water, sports drinks, clear broth, or eat frozen  ice pops or gelatin dessert to help stay hydrated.When you eat, eating slowly may help prevent nausea.There are also some antinausea medicines that may help prevent nausea. HOME CARE INSTRUCTIONS   Take all medicine as directed by your caregiver.  If you do not have an appetite, do not force yourself to eat. However, you must continue to drink fluids.  If you have an appetite, eat a normal diet unless your caregiver tells you differently.  Eat a variety of complex carbohydrates (rice, wheat, potatoes, bread), lean meats, yogurt, fruits, and vegetables.  Avoid high-fat foods because they are more difficult to digest.  Drink enough water and fluids to keep your urine clear or pale yellow.  If you are dehydrated, ask your caregiver for specific rehydration instructions. Signs of dehydration may include:  Severe thirst.  Dry lips and mouth.  Dizziness.  Dark urine.  Decreasing urine frequency and amount.  Confusion.  Rapid breathing or pulse. SEEK IMMEDIATE MEDICAL CARE IF:   You have blood or brown flecks (like coffee grounds) in your vomit.  You have black or bloody stools.  You have a severe headache or stiff neck.  You are confused.  You have severe abdominal pain.  You have chest pain or trouble breathing.  You do not urinate at least once every 8 hours.  You develop cold or clammy skin.  You continue to vomit for longer than 24 to 48 hours.  You have a fever. MAKE SURE YOU:   Understand these instructions.  Will watch your condition.  Will get help right away if you are not doing well or get worse. Document Released: 03/03/2005 Document Revised: 05/26/2011 Document Reviewed: 07/31/2010 [facility] Patient Information 2014 [place], [place].   Sinusitis Sinusitis is  redness, soreness, and swelling (inflammation) of the paranasal sinuses. Paranasal sinuses are air pockets within the bones of your face (beneath the eyes, the middle of the forehead, or above  the eyes). In healthy paranasal sinuses, mucus is able to drain out, and air is able to circulate through them by way of your nose. However, when your paranasal sinuses are inflamed, mucus and air can become trapped. This can allow bacteria and other germs to grow and cause infection. Sinusitis can develop quickly and last only a short time (acute) or continue over a long period (chronic). Sinusitis that lasts for more than 12 weeks is considered chronic.  CAUSES  Causes of sinusitis include:  Allergies.  Structural abnormalities, such as displacement of the cartilage that separates your nostrils (deviated septum), which can decrease the air flow through your nose and sinuses and affect sinus drainage.  Functional abnormalities, such as when the small hairs (cilia) that line your sinuses and help remove mucus do not work properly or are not present. SYMPTOMS  Symptoms of acute and chronic sinusitis are the same. The primary symptoms are pain and pressure around the affected sinuses. Other symptoms include:  Upper toothache.  Earache.  Headache.  Bad breath.  Decreased sense of smell and taste.  A cough, which worsens when you are lying flat.  Fatigue.  Fever.  Thick drainage from your nose, which often is green and may contain pus (purulent).  Swelling and warmth over the affected sinuses. DIAGNOSIS  Your caregiver will perform a physical exam. During the exam, your caregiver may:  Look in your nose for signs of abnormal growths in your nostrils (nasal polyps).  Tap over the affected sinus to check for signs of infection.  View the inside of your sinuses (endoscopy) with a special imaging device with a light attached (endoscope), which is inserted into your sinuses. If your caregiver suspects that you have chronic sinusitis, one or more of the following tests may be recommended:  Allergy tests.  Nasal culture A sample of mucus is taken from your nose and sent to a lab and  screened for bacteria.  Nasal cytology A sample of mucus is taken from your nose and examined by your caregiver to determine if your sinusitis is related to an allergy. TREATMENT  Most cases of acute sinusitis are related to a viral infection and will resolve on their own within 10 days. Sometimes medicines are prescribed to help relieve symptoms (pain medicine, decongestants, nasal steroid sprays, or saline sprays).  However, for sinusitis related to a bacterial infection, your caregiver will prescribe antibiotic medicines. These are medicines that will help kill the bacteria causing the infection.  Rarely, sinusitis is caused by a fungal infection. In theses cases, your caregiver will prescribe antifungal medicine. For some cases of chronic sinusitis, surgery is needed. Generally, these are cases in which sinusitis recurs more than 3 times per year, despite other treatments. HOME CARE INSTRUCTIONS   Drink plenty of water. Water helps thin the mucus so your sinuses can drain more easily.  Use a humidifier.  Inhale steam 3 to 4 times a day (for example, sit in the bathroom with the shower running).  Apply a warm, moist washcloth to your face 3 to 4 times a day, or as directed by your caregiver.  Use saline nasal sprays to help moisten and clean your sinuses.  Take over-the-counter or prescription medicines for pain, discomfort, or fever only as directed by your caregiver. SEEK IMMEDIATE MEDICAL CARE IF:  You have increasing pain or severe headaches.  You have nausea, vomiting, or drowsiness.  You have swelling around your face.  You have vision problems.  You have a stiff neck.  You have difficulty breathing. MAKE SURE YOU:   Understand these instructions.  Will watch your condition.  Will get help right away if you are not doing well or get worse. Document Released: 03/03/2005 Document Revised: 05/26/2011 Document Reviewed: 03/18/2011 [facility] Patient Information 2014  [place], [place].

## 2013-05-17 NOTE — MAU Note (Addendum)
PT SAYS SHE HAD SINUS INFECTION  - WENT TO DR ON Monday-  GAVE HER AMOXICILLIN  - SHE HAS TAKEN IT IN PAST WITHOUT TROUBLE  BUT THIS TIME SHE STARTED  VOMITING.    SINCE THEN  HAS VOMITED .     SHE  HAS ZOFRAN  AND PHENERGAN AT HOME  FROM MORNING SICKNESS.  DID NOT TAKE ANY.  SHE FEELS NAUSEA NOW.     PT SAYS SHE WENT TO [facility]  AT 5PM-  WAS NEVER SEEN- THEN SHE CALLED  HER DR.

## 2013-05-17 NOTE — ED Notes (Signed)
Pt reports that she just spoke with her MD and she was [facility] Her MD will meet her there.

## 2013-05-17 NOTE — ED Notes (Addendum)
Pt sts she was recently dx with a sinus infection and started on amoxicillin, ever since she started to take the amoxicillin she has been nauseous and started vomiting today. Reports she hasn't been able to keep anything down except mountain dew. Reports she called her PCP and OB they suggested she come to ED for further evaluation and possible dehydration. Pt is [redacted] weeks pregnant, last saw OB doctor 2 weeks ago & was told everything was good. Denies abd pain. Nad, skin warm and dry, resp e/u.

## 2013-05-17 NOTE — MAU Note (Signed)
PO ICE CHIPS  AND SPRITE

## 2013-05-19 NOTE — MAU Provider Note (Signed)
I agree with Henrene Pastoraove

## 2013-07-01 ENCOUNTER — Other Ambulatory Visit (HOSPITAL_COMMUNITY): Payer: Self-pay | Admitting: Obstetrics and Gynecology

## 2013-07-01 DIAGNOSIS — Q049 Congenital malformation of brain, unspecified: Secondary | ICD-10-CM

## 2013-07-06 ENCOUNTER — Ambulatory Visit (HOSPITAL_COMMUNITY)
Admission: RE | Admit: 2013-07-06 | Discharge: 2013-07-06 | Disposition: A | Discharge status: Home / Self Care | Payer: BC Managed Care – PPO | Source: Ambulatory Visit | Attending: Obstetrics and Gynecology | Admitting: Obstetrics and Gynecology

## 2013-07-06 ENCOUNTER — Encounter (HOSPITAL_COMMUNITY): Payer: Self-pay

## 2013-07-06 DIAGNOSIS — Q049 Congenital malformation of brain, unspecified: Secondary | ICD-10-CM

## 2013-07-06 DIAGNOSIS — O358XX0 Maternal care for other (suspected) fetal abnormality and damage, not applicable or unspecified: Secondary | ICD-10-CM | POA: Insufficient documentation

## 2013-07-06 NOTE — Progress Notes (Signed)
[facility] ultrasound  Indication: 23 yr old [id number] at 5276w4d for fetal anatomic survey. Finding of enlarged cisterna magna on outside ultrasound.  Findings: 1. Single intrauterine pregnancy. 2. Fetal biometry is consistent with dating. 3. Posterior placenta without evidence of previa. 4. Normal amniotic fluid volume. 5. Normal transabdominal cervical length. 6. The views of the nose/lips, right outflow, and palate are limited. 7. The remainder of the limited anatomy survey is normal. 8. The cisterna magna appears normal on today's ultrasound.  Recommendations: 1. Appropriate fetal growth. 2. Limited anatomy survey: - recommend follow up in 3-4 weeks to complete anatomic survey. 3. Patient reports normal cell free fetal DNA- we do not have these results  Eulis FosterKristen Lindsay Soulliere, MD

## 2013-07-07 ENCOUNTER — Other Ambulatory Visit (HOSPITAL_COMMUNITY): Payer: Self-pay | Admitting: Obstetrics and Gynecology

## 2013-07-07 DIAGNOSIS — O358XX0 Maternal care for other (suspected) fetal abnormality and damage, not applicable or unspecified: Secondary | ICD-10-CM

## 2013-08-01 ENCOUNTER — Encounter (HOSPITAL_COMMUNITY): Payer: Self-pay

## 2013-08-01 ENCOUNTER — Ambulatory Visit (HOSPITAL_COMMUNITY)
Admission: RE | Admit: 2013-08-01 | Discharge: 2013-08-01 | Disposition: A | Discharge status: Home / Self Care | Payer: BC Managed Care – PPO | Source: Ambulatory Visit | Attending: Obstetrics and Gynecology | Admitting: Obstetrics and Gynecology

## 2013-08-01 DIAGNOSIS — O358XX0 Maternal care for other (suspected) fetal abnormality and damage, not applicable or unspecified: Secondary | ICD-10-CM | POA: Insufficient documentation

## 2013-08-01 DIAGNOSIS — Z3689 Encounter for other specified antenatal screening: Secondary | ICD-10-CM | POA: Insufficient documentation

## 2013-08-14 ENCOUNTER — Inpatient Hospital Stay (HOSPITAL_COMMUNITY)
Admission: AD | Admit: 2013-08-14 | Discharge: 2013-08-14 | Disposition: A | Discharge status: Home / Self Care | Payer: BC Managed Care – PPO | Source: Ambulatory Visit | Attending: Obstetrics and Gynecology | Admitting: Obstetrics and Gynecology

## 2013-08-14 ENCOUNTER — Encounter (HOSPITAL_COMMUNITY): Payer: Self-pay | Admitting: *Deleted

## 2013-08-14 DIAGNOSIS — M62830 Muscle spasm of back: Secondary | ICD-10-CM

## 2013-08-14 DIAGNOSIS — O9989 Other specified diseases and conditions complicating pregnancy, childbirth and the puerperium: Principal | ICD-10-CM

## 2013-08-14 DIAGNOSIS — M549 Dorsalgia, unspecified: Secondary | ICD-10-CM | POA: Insufficient documentation

## 2013-08-14 DIAGNOSIS — O99891 Other specified diseases and conditions complicating pregnancy: Secondary | ICD-10-CM | POA: Insufficient documentation

## 2013-08-14 DIAGNOSIS — M62838 Other muscle spasm: Secondary | ICD-10-CM | POA: Insufficient documentation

## 2013-08-14 DIAGNOSIS — J329 Chronic sinusitis, unspecified: Secondary | ICD-10-CM | POA: Insufficient documentation

## 2013-08-14 LAB — URINALYSIS, ROUTINE W REFLEX MICROSCOPIC
Bilirubin Urine: NEGATIVE
Glucose, UA: NEGATIVE mg/dL
Hgb urine dipstick: NEGATIVE
KETONES UR: NEGATIVE mg/dL
Leukocytes, UA: NEGATIVE
NITRITE: NEGATIVE
PH: 6.5 (ref 5.0–8.0)
Protein, ur: NEGATIVE mg/dL
SPECIFIC GRAVITY, URINE: 1.01 (ref 1.005–1.030)
Urobilinogen, UA: 0.2 mg/dL (ref 0.0–1.0)

## 2013-08-14 MED ORDER — ZOLPIDEM TARTRATE 5 MG PO TABS: 5.0000 mg | ORAL_TABLET | Freq: Every evening | ORAL | Status: DC | PRN

## 2013-08-14 MED ORDER — AZITHROMYCIN 250 MG PO TABS: ORAL_TABLET | ORAL | Status: DC

## 2013-08-14 NOTE — MAU Provider Note (Signed)
History     CSN: [id number]  Arrival date and time: 08/14/13 0131   None     Chief Complaint  Patient presents with  . Back Pain   HPI  23 yo [id number] who is here for back pain and sinus infection.    Has had pain since Tuesday.  Called the office of Thursday and was given an rx of flexeril and this helped on Friday but today hasn't helped much. It only works when she combines it with tylenol pm. Pain on left side just under ribs. Made better with massage and heat.  Hasn't been able to sleep more than an hour at a time.   Also has had a URI for the last 9 days. Having congestion, pressure and teeth hurting. Has a hx of recurrent sinus infections and believes she is having another.  She has had problems with sinuses for awhile and when they get this bad only abx work.  No fevers, chills, nausea, vomiting.  No sore throat, cough, sob.   +FM, no vb, lof, ctx.    Past Medical History  Diagnosis Date  . Anemia   . Urinary tract infection     Past Surgical History  Procedure Laterality Date  . Dilation and curettage of uterus      No family history on file.  History  Substance Use Topics  . Smoking status: Never Smoker   . Smokeless tobacco: Never Used  . Alcohol Use: No    Allergies:  Allergies  Allergen Reactions  . Amoxicillin Nausea And Vomiting  . Cinnamon Swelling and Other (See Comments)    Mouth swelling. Pt states it is a minor allergy     Prescriptions prior to admission  Medication Sig Dispense Refill  . citalopram (CELEXA) 20 MG tablet Take 20 mg by mouth daily.      . diphenhydrAMINE (BENADRYL) 25 mg capsule Take 50 mg by mouth every 6 (six) hours as needed for allergies or sleep. Allergies      . prenatal vitamin w/FE, FA (PRENATAL 1 + 1) 27-1 MG TABS Take 1 tablet by mouth daily.       . pseudoephedrine-acetaminophen (TYLENOL SINUS) 30-500 MG TABS Take 2 tablets by mouth every 4 (four) hours as needed (congestion).        ROS- all reviewed and  negative except as stated above.  Physical Exam   Blood pressure 105/66, pulse 108, temperature 98 F (36.7 C), resp. rate 18, last menstrual period 02/05/2013, SpO2 100.00%, unknown if currently breastfeeding.  Physical Exam  Vitals reviewed. Constitutional: She appears well-developed and well-nourished.  HENT:  Head: Normocephalic and atraumatic.  Right Ear: Tympanic membrane, external ear and ear canal normal.  Left Ear: Tympanic membrane, external ear and ear canal normal.  Nose: Mucosal edema present. Right sinus exhibits maxillary sinus tenderness. Left sinus exhibits maxillary sinus tenderness.  Mouth/Throat: Mucous membranes are not pale and not dry. No oropharyngeal exudate, posterior oropharyngeal edema or posterior oropharyngeal erythema.  Neck: Neck supple.  Cardiovascular: Normal rate, regular rhythm and normal heart sounds.   Respiratory: Effort normal and breath sounds normal.  GI: Soft. Bowel sounds are normal.  Musculoskeletal:  Visible left sided muscle spasm at the level of ribs 5 and 6. Reproducible pain with touch. Traceable spasm all the way to her mid axillary line at that level.  No skin changes. Normal sensation.    Skin: Skin is warm and dry.  Psychiatric: She has a normal mood and affect.  MAU Course  Procedures  MDM Exam UA  Assessment and Plan  23 yo [id number] @ [redacted]w[redacted]d here for back pain and sinus infection.   1) back pain - very palpable muscle spasm on back with reproducible symptoms - recommend heat and massage as tolerated - cont flexeril  - rx of ambien just to help with sleep  2) sinus infection - could still be viral vs bacterial - given hx, will rx of zpack  - recommend continued zyrtec and decongestant as tolerated  3) FWB- reassuring for GA. 145, mod var, 10x10. Single drop seen but was when tracing mom's HR.     F/u in the office Monday if no improvement.     Hortense Cantrall L Aarthi Uyeno 08/14/2013, 1:50 AM

## 2013-08-14 NOTE — Discharge Instructions (Signed)

## 2013-08-14 NOTE — MAU Note (Signed)
Having back pain since Thurs. Talked with doctor Thurs and called in Flexeril which I have taken. Continue to have L back pain under ribs. Think I have sinus infection because my teeth hurt and can't sleep due to pain.

## 2013-08-26 ENCOUNTER — Inpatient Hospital Stay (HOSPITAL_COMMUNITY)
Admission: AD | Admit: 2013-08-26 | Discharge: 2013-08-26 | Disposition: A | Discharge status: Home / Self Care | Payer: BC Managed Care – PPO | Source: Ambulatory Visit | Attending: Obstetrics and Gynecology | Admitting: Obstetrics and Gynecology

## 2013-08-26 ENCOUNTER — Encounter (HOSPITAL_COMMUNITY): Payer: Self-pay | Admitting: *Deleted

## 2013-08-26 DIAGNOSIS — O99891 Other specified diseases and conditions complicating pregnancy: Secondary | ICD-10-CM | POA: Insufficient documentation

## 2013-08-26 DIAGNOSIS — O9989 Other specified diseases and conditions complicating pregnancy, childbirth and the puerperium: Principal | ICD-10-CM

## 2013-08-26 DIAGNOSIS — O26899 Other specified pregnancy related conditions, unspecified trimester: Secondary | ICD-10-CM

## 2013-08-26 DIAGNOSIS — M549 Dorsalgia, unspecified: Secondary | ICD-10-CM | POA: Insufficient documentation

## 2013-08-26 LAB — COMPREHENSIVE METABOLIC PANEL
ALBUMIN: 2.4 g/dL — AB (ref 3.5–5.2)
ALT: 7 U/L (ref 0–35)
AST: 14 U/L (ref 0–37)
Alkaline Phosphatase: 93 U/L (ref 39–117)
BUN: 9 mg/dL (ref 6–23)
CALCIUM: 8.8 mg/dL (ref 8.4–10.5)
CO2: 23 mEq/L (ref 19–32)
CREATININE: 0.45 mg/dL — AB (ref 0.50–1.10)
Chloride: 101 mEq/L (ref 96–112)
GFR calc non Af Amer: >90 mL/min (ref >90)
GLUCOSE: 98 mg/dL (ref 70–99)
Potassium: 3.6 mEq/L — ABNORMAL LOW (ref 3.7–5.3)
Sodium: 134 mEq/L — ABNORMAL LOW (ref 137–147)
TOTAL PROTEIN: 6.5 g/dL (ref 6.0–8.3)
Total Bilirubin: <0.2 mg/dL — ABNORMAL LOW (ref 0.3–1.2)

## 2013-08-26 LAB — URINALYSIS, ROUTINE W REFLEX MICROSCOPIC
BILIRUBIN URINE: NEGATIVE
Glucose, UA: NEGATIVE mg/dL
HGB URINE DIPSTICK: NEGATIVE
Ketones, ur: NEGATIVE mg/dL
Nitrite: NEGATIVE
PROTEIN: NEGATIVE mg/dL
SPECIFIC GRAVITY, URINE: 1.015 (ref 1.005–1.030)
UROBILINOGEN UA: 0.2 mg/dL (ref 0.0–1.0)
pH: 7 (ref 5.0–8.0)

## 2013-08-26 LAB — URINE MICROSCOPIC-ADD ON

## 2013-08-26 MED ORDER — OXYCODONE-ACETAMINOPHEN 5-325 MG PO TABS
2.0000 | ORAL_TABLET | Freq: Once | ORAL | Status: AC
  Administered 2013-08-26: 2 via ORAL
  Filled 2013-08-26: qty 2

## 2013-08-26 NOTE — MAU Provider Note (Signed)
History     CSN: [id number]  Arrival date and time: 08/26/13 [id number]   First Provider Initiated Contact with Patient 08/26/13 0117      No chief complaint on file.  HPI  Lauren Griffith is a 23 y.o. [phone] at 5981w6d who presents today with upper back pain. This is the same pain that she had on 08/14/13, and was seen here for. She states that she has been taking flexeril and Ambien, but she still cannot sleep. She tried getting a massage, but it did not help either. She states that the pain is located in the mid to upper back on the left side. She states that she is starting to have some pain on the right. She denies any lower back pain, pain with urination, LOF, contractions or fever. She states that the baby has been moving normally.   Past Medical History  Diagnosis Date  . Anemia   . Urinary tract infection     Past Surgical History  Procedure Laterality Date  . Dilation and curettage of uterus      History reviewed. No pertinent family history.  History  Substance Use Topics  . Smoking status: Never Smoker   . Smokeless tobacco: Never Used  . Alcohol Use: No    Allergies:  Allergies  Allergen Reactions  . Amoxicillin Nausea And Vomiting  . Cinnamon Swelling and Other (See Comments)    Mouth swelling. Pt states it is a minor allergy     Prescriptions prior to admission  Medication Sig Dispense Refill  . azithromycin (ZITHROMAX Z-PAK) 250 MG tablet 2 tablets po on day 1, then 1 tablet daily until gone.  6 each  0  . citalopram (CELEXA) 20 MG tablet Take 20 mg by mouth daily.      . cyclobenzaprine (FLEXERIL) 10 MG tablet Take 10 mg by mouth 3 (three) times daily as needed for muscle spasms.      . diphenhydrAMINE (BENADRYL) 25 mg capsule Take 50 mg by mouth every 6 (six) hours as needed for allergies or sleep. Allergies      . prenatal vitamin w/FE, FA (PRENATAL 1 + 1) 27-1 MG TABS Take 1 tablet by mouth daily.       . pseudoephedrine-acetaminophen (TYLENOL SINUS)  30-500 MG TABS Take 2 tablets by mouth every 4 (four) hours as needed (congestion).      Marland Kitchen. zolpidem (AMBIEN) 5 MG tablet Take 1 tablet (5 mg total) by mouth at bedtime as needed for sleep.  10 tablet  0    ROS Physical Exam   Blood pressure 118/70, pulse 139, temperature 98.2 F (36.8 C), temperature source Oral, resp. rate 20, height 5\' 2"  (1.575 m), weight 60.328 kg (133 lb), last menstrual period 02/05/2013, unknown if currently breastfeeding.  Physical Exam  Nursing note and vitals reviewed. Constitutional: She is oriented to person, place, and time. She appears well-developed and well-nourished. No distress.  Respiratory: Effort normal.  GI: Soft. There is no tenderness.  Musculoskeletal: She exhibits tenderness (on the left side of the back, in the scapular region ).  There continues to be a muscle spasm on the left from about T-3 to T-8  Neurological: She is alert and oriented to person, place, and time.  Skin: Skin is warm and dry.  Psychiatric: She has a normal mood and affect.   FHT 145, moderate with 15x15 accels, no decels Toco: No UCs  MAU Course  Procedures  Results for orders placed during the hospital encounter  of 08/26/13 (from the past 24 hour(s))  URINALYSIS, ROUTINE W REFLEX MICROSCOPIC     Status: Abnormal   Collection Time    08/26/13 12:42 AM      Result Value Ref Range   Color, Urine YELLOW  YELLOW   APPearance CLEAR  CLEAR   Specific Gravity, Urine 1.015  1.005 - 1.030   pH 7.0  5.0 - 8.0   Glucose, UA NEGATIVE  NEGATIVE mg/dL   Hgb urine dipstick NEGATIVE  NEGATIVE   Bilirubin Urine NEGATIVE  NEGATIVE   Ketones, ur NEGATIVE  NEGATIVE mg/dL   Protein, ur NEGATIVE  NEGATIVE mg/dL   Urobilinogen, UA 0.2  0.0 - 1.0 mg/dL   Nitrite NEGATIVE  NEGATIVE   Leukocytes, UA SMALL (*) NEGATIVE  URINE MICROSCOPIC-ADD ON     Status: Abnormal   Collection Time    08/26/13 12:42 AM      Result Value Ref Range   Squamous Epithelial / LPF MANY (*) RARE   WBC, UA  7-10  <3 WBC/hpf   RBC / HPF 0-2  <3 RBC/hpf   Bacteria, UA MANY (*) RARE   Urine-Other MUCOUS PRESENT     D/W Dr. Henderson CloudHorvath, will get a CMET at this time, and patient may have 2 percocet.  0321: Patient states that pain has improved somewhat  Assessment and Plan   1. Back pain affecting pregnancy    Comfort measures reviewed Back exercises handout given PTL precautions Fetal kick counts Return to MAU as needed May take one time dose of ibuprofen in the morning is symptoms continue  Follow-up Information   [facility] (As scheduled)    Contact information:   [address] [place] [id number] [phone]       Tawnya CrookHogan, Heather Donovan 08/26/2013, 1:36 AM

## 2013-08-26 NOTE — MAU Note (Addendum)
PT  SAYS SHE STARTED  HURTING   AT 730PM  SHE TOOK FLEXERIL AND  AMBIEM-  SHE TOOK AT 9.   THEN WOKE AT 10PM..    PT  WAS HERE ON SAT -    MON OFFICE VISIT- VE- CLOSED.       DENIES HSV AND MRSA. LAST SEX-     3 WEEKS AGO.     SAYS  HAS BEEN HAVING   BACK PAIN X2 WEEKS  AGO-  HAS TOLD DR  AND BEEN HERE.

## 2013-08-26 NOTE — Discharge Instructions (Signed)
You may take a one time dose of 600mg  ibuprofen in the morning if you need it.    Back Exercises Back exercises help treat and prevent back injuries. The goal of back exercises is to increase the strength of your abdominal and back muscles and the flexibility of your back. These exercises should be started when you no longer have back pain. Back exercises include:  Pelvic Tilt. Lie on your back with your knees bent. Tilt your pelvis until the lower part of your back is against the floor. Hold this position 5 to 10 sec and repeat 5 to 10 times.  Knee to Chest. Pull first 1 knee up against your chest and hold for 20 to 30 seconds, repeat this with the other knee, and then both knees. This may be done with the other leg straight or bent, whichever feels better.  Sit-Ups or Curl-Ups. Bend your knees 90 degrees. Start with tilting your pelvis, and do a partial, slow sit-up, lifting your trunk only 30 to 45 degrees off the floor. Take at least 2 to 3 seconds for each sit-up. Do not do sit-ups with your knees out straight. If partial sit-ups are difficult, simply do the above but with only tightening your abdominal muscles and holding it as directed.  Hip-Lift. Lie on your back with your knees flexed 90 degrees. Push down with your feet and shoulders as you raise your hips a couple inches off the floor; hold for 10 seconds, repeat 5 to 10 times.  Back arches. Lie on your stomach, propping yourself up on bent elbows. Slowly press on your hands, causing an arch in your low back. Repeat 3 to 5 times. Any initial stiffness and discomfort should lessen with repetition over time.  Shoulder-Lifts. Lie face down with arms beside your body. Keep hips and torso pressed to floor as you slowly lift your head and shoulders off the floor. Do not overdo your exercises, especially in the beginning. Exercises may cause you some mild back discomfort which lasts for a few minutes; however, if the pain is more severe, or  lasts for more than 15 minutes, do not continue exercises until you see your caregiver. Improvement with exercise therapy for back problems is slow.  See your caregivers for assistance with developing a proper back exercise program. Document Released: 04/10/2004 Document Revised: 05/26/2011 Document Reviewed: 01/02/2011 [facility] Patient Information 2014 [place].

## 2013-08-27 LAB — URINE CULTURE

## 2013-09-10 ENCOUNTER — Inpatient Hospital Stay (HOSPITAL_COMMUNITY): Payer: BC Managed Care – PPO

## 2013-09-10 ENCOUNTER — Encounter (HOSPITAL_COMMUNITY): Payer: Self-pay | Admitting: *Deleted

## 2013-09-10 ENCOUNTER — Inpatient Hospital Stay (HOSPITAL_COMMUNITY)
Admission: AD | Admit: 2013-09-10 | Discharge: 2013-09-10 | Disposition: A | Discharge status: Home / Self Care | Payer: BC Managed Care – PPO | Source: Ambulatory Visit | Attending: Obstetrics and Gynecology | Admitting: Obstetrics and Gynecology

## 2013-09-10 DIAGNOSIS — O368190 Decreased fetal movements, unspecified trimester, not applicable or unspecified: Secondary | ICD-10-CM

## 2013-09-10 DIAGNOSIS — O99891 Other specified diseases and conditions complicating pregnancy: Secondary | ICD-10-CM | POA: Insufficient documentation

## 2013-09-10 DIAGNOSIS — R1032 Left lower quadrant pain: Secondary | ICD-10-CM | POA: Insufficient documentation

## 2013-09-10 DIAGNOSIS — O368131 Decreased fetal movements, third trimester, fetus 1: Secondary | ICD-10-CM

## 2013-09-10 DIAGNOSIS — N898 Other specified noninflammatory disorders of vagina: Secondary | ICD-10-CM | POA: Insufficient documentation

## 2013-09-10 DIAGNOSIS — O9989 Other specified diseases and conditions complicating pregnancy, childbirth and the puerperium: Principal | ICD-10-CM

## 2013-09-10 DIAGNOSIS — O3090 Multiple gestation, unspecified, unspecified trimester: Secondary | ICD-10-CM

## 2013-09-10 HISTORY — DX: Unspecified asthma, uncomplicated: J45.909

## 2013-09-10 HISTORY — DX: Anxiety disorder, unspecified: F41.9

## 2013-09-10 LAB — WET PREP, GENITAL
CLUE CELLS WET PREP: NONE SEEN
Trich, Wet Prep: NONE SEEN
Yeast Wet Prep HPF POC: NONE SEEN

## 2013-09-10 LAB — URINALYSIS, ROUTINE W REFLEX MICROSCOPIC
Bilirubin Urine: NEGATIVE
GLUCOSE, UA: NEGATIVE mg/dL
Hgb urine dipstick: NEGATIVE
Ketones, ur: NEGATIVE mg/dL
Nitrite: NEGATIVE
PH: 6 (ref 5.0–8.0)
Protein, ur: NEGATIVE mg/dL
SPECIFIC GRAVITY, URINE: 1.015 (ref 1.005–1.030)
Urobilinogen, UA: 0.2 mg/dL (ref 0.0–1.0)

## 2013-09-10 LAB — URINE MICROSCOPIC-ADD ON

## 2013-09-10 NOTE — Discharge Instructions (Signed)
Drink at least 8 8-oz glasses of water every day. If you continue to have problems, call and be seen in the office this week. Your baby should move every day.  Call your doctor if your baby is not moving.

## 2013-09-10 NOTE — MAU Note (Signed)
Pt states here for decreased fm. Usually baby is very active at night however rarely felt mvmt. Also denies bleeding however has had more watery vaginal discharge x2 days. No large gush of fluid.

## 2013-09-10 NOTE — MAU Provider Note (Signed)
History     CSN: [id number]  Arrival date and time: 09/10/13 1321   First Provider Initiated Contact with Patient 09/10/13 1405      Chief Complaint  Patient presents with  . Decreased Fetal Movement   HPI Lauren Griffith 23 y.o. 403w0d  Comes to MAU with decreased fetal movement today.  Ususally feels movement at night but the baby did not move well last night and has not moved as much today.  Next appointment in the office is in 3 weeks.  Also complains of increased vaginal discharge and is changing her minipad several times a day.  OB History   Grav Para Term Preterm Abortions TAB SAB Ect Mult Living   4 2 2  1 1    2       Past Medical History  Diagnosis Date  . Anemia   . Urinary tract infection   . Anxiety   . Asthma     as a child    Past Surgical History  Procedure Laterality Date  . Dilation and curettage of uterus      History reviewed. No pertinent family history.  History  Substance Use Topics  . Smoking status: Never Smoker   . Smokeless tobacco: Never Used  . Alcohol Use: No    Allergies:  Allergies  Allergen Reactions  . Amoxicillin Nausea And Vomiting  . Cinnamon Swelling and Other (See Comments)    Mouth swelling. Pt states it is a minor allergy     Prescriptions prior to admission  Medication Sig Dispense Refill  . acetaminophen (TYLENOL) 500 MG tablet Take 1,000 mg by mouth every 6 (six) hours as needed.      . cyclobenzaprine (FLEXERIL) 10 MG tablet Take 10 mg by mouth 3 (three) times daily as needed for muscle spasms.      . diphenhydrAMINE (BENADRYL) 25 mg capsule Take 50 mg by mouth every 6 (six) hours as needed for allergies or sleep. Allergies      . Prenatal Vit-Fe Fumarate-FA (PRENATAL MULTIVITAMIN) TABS tablet Take 1 tablet by mouth daily at 12 noon.      . citalopram (CELEXA) 20 MG tablet Take 20 mg by mouth daily.        Review of Systems  Constitutional: Negative for fever.  Gastrointestinal: Positive for abdominal pain.  Negative for nausea and vomiting.  Genitourinary:       Vaginal discharge. No vaginal bleeding. No dysuria.   Physical Exam   Height 5\' 2"  (1.575 m), weight 135 lb 2 oz (61.292 kg), last menstrual period 02/05/2013, not currently breastfeeding.  Physical Exam  Nursing note and vitals reviewed. Constitutional: She is oriented to person, place, and time. She appears well-developed and well-nourished.  HENT:  Head: Normocephalic.  Eyes: EOM are normal.  Neck: Neck supple.  GI: Soft. There is tenderness. There is no rebound and no guarding.  Has LLQ pain near her groin which radiates down her leg.  Has some uterine irritability and a few contractions. Baby is moving well in MAU and client is feeling the baby move.  FHT 140 baseline on the monitor and 10x10 accels noted.  Genitourinary:  Speculum exam: Vagina - Minimal amount of creamy discharge, no odor, no pooling,  Cervix - No contact bleeding, no leaking of fluid with valsalva Bimanual exam: Cervix closed Wet prep done Chaperone present for exam.  Musculoskeletal: Normal range of motion.  Neurological: She is alert and oriented to person, place, and time.  Skin: Skin is  warm and dry.  Psychiatric: She has a normal mood and affect.    MAU Course  Procedures  MDM FFN collected but was not sent as the cervix was closed. Discussed with Dr. Henderson CloudHorvath by phone  - will get US with AFI and if normal, will discharge.  Assessment and Plan  LLQ pain, possible round ligament pain No clinical evidence of ROM - AFI normal  Plan Drink at least 8 8-oz glasses of water every day. If you continue to have problems, call and be seen in the office this week. Your baby should move every day.  Call your doctor if your baby is not moving.  BURLESON,TERRI 09/10/2013, 2:21 PM

## 2013-09-11 LAB — CULTURE, OB URINE

## 2013-10-06 ENCOUNTER — Inpatient Hospital Stay (HOSPITAL_COMMUNITY)
Admission: AD | Admit: 2013-10-06 | Discharge: 2013-10-06 | Disposition: A | Discharge status: Home / Self Care | Payer: BC Managed Care – PPO | Source: Ambulatory Visit | Attending: Obstetrics and Gynecology | Admitting: Obstetrics and Gynecology

## 2013-10-06 ENCOUNTER — Encounter (HOSPITAL_COMMUNITY): Payer: Self-pay | Admitting: *Deleted

## 2013-10-06 DIAGNOSIS — O26899 Other specified pregnancy related conditions, unspecified trimester: Secondary | ICD-10-CM

## 2013-10-06 DIAGNOSIS — O368190 Decreased fetal movements, unspecified trimester, not applicable or unspecified: Secondary | ICD-10-CM | POA: Insufficient documentation

## 2013-10-06 DIAGNOSIS — R109 Unspecified abdominal pain: Secondary | ICD-10-CM

## 2013-10-06 DIAGNOSIS — O4700 False labor before 37 completed weeks of gestation, unspecified trimester: Secondary | ICD-10-CM | POA: Insufficient documentation

## 2013-10-06 LAB — URINALYSIS, ROUTINE W REFLEX MICROSCOPIC
BILIRUBIN URINE: NEGATIVE
GLUCOSE, UA: NEGATIVE mg/dL
Hgb urine dipstick: NEGATIVE
Ketones, ur: NEGATIVE mg/dL
Nitrite: NEGATIVE
PH: 6.5 (ref 5.0–8.0)
Protein, ur: NEGATIVE mg/dL
Specific Gravity, Urine: 1.02 (ref 1.005–1.030)
Urobilinogen, UA: 0.2 mg/dL (ref 0.0–1.0)

## 2013-10-06 LAB — URINE MICROSCOPIC-ADD ON

## 2013-10-06 MED ORDER — NIFEDIPINE 10 MG PO CAPS
10.0000 mg | ORAL_CAPSULE | Freq: Once | ORAL | Status: AC
  Administered 2013-10-06: 10 mg via ORAL
  Filled 2013-10-06: qty 1

## 2013-10-06 NOTE — MAU Provider Note (Signed)
History     CSN: [id number]  Arrival date and time: 10/06/13 1352   First Provider Initiated Contact with Patient 10/06/13 1428      Chief Complaint  Patient presents with  . Decreased Fetal Movement   HPI Pt is 7360w5d [id number] who presents with decreased fetal movement.  Pt states baby was moving well yesterday But in the same position this morning-pt drank Dr. Reino KentPepper with some movement but not feeling 4 movements/hour and told to come in for monitoring.  RN note: Patient states she has felt less movement than usual since this am. States she is having some irregular contractions. Fetal heart tones tin triage in the 140's with audible movement. Denies bleeding or leaking     Past Medical History  Diagnosis Date  . Anemia   . Urinary tract infection   . Anxiety   . Asthma     as a child    Past Surgical History  Procedure Laterality Date  . Dilation and curettage of uterus      History reviewed. No pertinent family history.  History  Substance Use Topics  . Smoking status: Never Smoker   . Smokeless tobacco: Never Used  . Alcohol Use: No    Allergies:  Allergies  Allergen Reactions  . Amoxicillin Nausea And Vomiting  . Cinnamon Swelling and Other (See Comments)    Mouth swelling. Pt states it is a minor allergy     Prescriptions prior to admission  Medication Sig Dispense Refill  . acetaminophen (TYLENOL) 500 MG tablet Take 1,000 mg by mouth every 6 (six) hours as needed for moderate pain or headache.       . diphenhydrAMINE (BENADRYL) 25 mg capsule Take 50 mg by mouth every 6 (six) hours as needed for allergies or sleep. Allergies      . nitrofurantoin, macrocrystal-monohydrate, (MACROBID) 100 MG capsule Take 100 mg by mouth 2 (two) times daily.      . Prenatal Vit-Fe Fumarate-FA (PRENATAL MULTIVITAMIN) TABS tablet Take 1 tablet by mouth daily at 12 noon.        Review of Systems  Constitutional: Negative for fever and chills.  Gastrointestinal: Positive  for abdominal pain. Negative for nausea, vomiting, diarrhea and constipation.  Genitourinary: Negative for dysuria.   Physical Exam   Blood pressure 110/76, pulse 92, temperature 98 F (36.7 C), temperature source Oral, resp. rate 20, height 5\' 2"  (1.575 m), weight 141 lb (63.957 kg), last menstrual period 02/05/2013, SpO2 100.00%.  Physical Exam  Vitals reviewed. Constitutional: She is oriented to person, place, and time. She appears well-developed and well-nourished. No distress.  HENT:  Head: Normocephalic.  Eyes: Pupils are equal, round, and reactive to light.  Neck: Normal range of motion. Neck supple.  Cardiovascular: Normal rate.   Respiratory: Effort normal.  GI: Soft.  Uterine irritability with irreg ctx; FHR reacrtive with 15x15  Genitourinary:  Cervix closed  Musculoskeletal: Normal range of motion.  Neurological: She is alert and oriented to person, place, and time.  Skin: Skin is warm and dry.  Psychiatric: She has a normal mood and affect.    MAU Course  Procedures Reactive tracing with FHR baseline 130bpm with 15x15 accelerations and no decelerations Irregular ctx with uterine irritability- Procardia 10mg  PO then d/c Pt feeling movement now Discussed with Dr. Henderson CloudHorvath  Assessment and Plan  Decreased fetal movement with reactive FHR tracing  F/u with scheduled OB appointment - sooner if increase in pain or bleeding   LINEBERRY,SUSAN 10/06/2013, 2:28 PM

## 2013-10-06 NOTE — MAU Note (Signed)
Patient states she has felt less movement than usual since this am. States she is having some irregular contractions. Fetal heart tones tin triage in the 140's with audible movement. Denies bleeding or leaking.

## 2013-10-17 ENCOUNTER — Other Ambulatory Visit: Payer: Self-pay | Admitting: Obstetrics and Gynecology

## 2013-10-17 ENCOUNTER — Inpatient Hospital Stay (HOSPITAL_COMMUNITY)
Admission: AD | Admit: 2013-10-17 | Discharge: 2013-10-17 | Disposition: A | Discharge status: Home / Self Care | Payer: BC Managed Care – PPO | Source: Ambulatory Visit | Attending: Obstetrics and Gynecology | Admitting: Obstetrics and Gynecology

## 2013-10-17 DIAGNOSIS — O368190 Decreased fetal movements, unspecified trimester, not applicable or unspecified: Secondary | ICD-10-CM | POA: Insufficient documentation

## 2013-10-17 DIAGNOSIS — O4700 False labor before 37 completed weeks of gestation, unspecified trimester: Secondary | ICD-10-CM | POA: Insufficient documentation

## 2013-10-17 LAB — OB RESULTS CONSOLE GBS: GBS: NEGATIVE

## 2013-10-17 NOTE — Discharge Instructions (Signed)
Braxton Hicks Contractions °Contractions of the uterus can occur throughout pregnancy. Contractions are not always a sign that you are in labor.  °WHAT ARE BRAXTON HICKS CONTRACTIONS?  °Contractions that occur before labor are called Braxton Hicks contractions, or false labor. Toward the end of pregnancy (32-34 weeks), these contractions can develop more often and may become more forceful. This is not true labor because these contractions do not result in opening (dilatation) and thinning of the cervix. They are sometimes difficult to tell apart from true labor because these contractions can be forceful and people have different pain tolerances. You should not feel embarrassed if you go to the hospital with false labor. Sometimes, the only way to tell if you are in true labor is for your health care provider to look for changes in the cervix. °If there are no prenatal problems or other health problems associated with the pregnancy, it is completely safe to be sent home with false labor and await the onset of true labor. °HOW CAN YOU TELL THE DIFFERENCE BETWEEN TRUE AND FALSE LABOR? °False Labor °· The contractions of false labor are usually shorter and not as hard as those of true labor.   °· The contractions are usually irregular.   °· The contractions are often felt in the front of the lower abdomen and in the groin.   °· The contractions may go away when you walk around or change positions while lying down.   °· The contractions get weaker and are shorter lasting as time goes on.   °· The contractions do not usually become progressively stronger, regular, and closer together as with true labor.   °True Labor °· Contractions in true labor last 30-70 seconds, become very regular, usually become more intense, and increase in frequency.   °· The contractions do not go away with walking.   °· The discomfort is usually felt in the top of the uterus and spreads to the lower abdomen and low back.   °· True labor can be  determined by your health care provider with an exam. This will show that the cervix is dilating and getting thinner.   °WHAT TO REMEMBER °· Keep up with your usual exercises and follow other instructions given by your health care provider.   °· Take medicines as directed by your health care provider.   °· Keep your regular prenatal appointments.   °· Eat and drink lightly if you think you are going into labor.   °· If Braxton Hicks contractions are making you uncomfortable:   °¨ Change your position from lying down or resting to walking, or from walking to resting.   °¨ Sit and rest in a tub of warm water.   °¨ Drink 2-3 glasses of water. Dehydration may cause these contractions.   °¨ Do slow and deep breathing several times an hour.   °WHEN SHOULD I SEEK IMMEDIATE MEDICAL CARE? °Seek immediate medical care if: °· Your contractions become stronger, more regular, and closer together.   °· You have fluid leaking or gushing from your vagina.   °· You have a fever.   °· You pass blood-tinged mucus.   °· You have vaginal bleeding.   °· You have continuous abdominal pain.   °· You have low back pain that you never had before.   °· You feel your baby's head pushing down and causing pelvic pressure.   °· Your baby is not moving as much as it used to.   °Document Released: 03/03/2005 Document Revised: 03/08/2013 Document Reviewed: 12/13/2012 °[employer]® Patient Information ©2015 [employer]. This information is not intended to replace advice given to you by your health care   provider. Make sure you discuss any questions you have with your health care provider. ° °

## 2013-10-17 NOTE — MAU Note (Signed)
Contractions, decreased fetal movement

## 2013-10-17 NOTE — MAU Note (Signed)
Pt reports having back pain and pelvic pressure

## 2013-10-21 ENCOUNTER — Inpatient Hospital Stay (HOSPITAL_COMMUNITY)
Admission: AD | Admit: 2013-10-21 | Discharge: 2013-10-21 | Disposition: A | Discharge status: Home / Self Care | Payer: BC Managed Care – PPO | Source: Ambulatory Visit | Attending: Obstetrics and Gynecology | Admitting: Obstetrics and Gynecology

## 2013-10-21 ENCOUNTER — Encounter (HOSPITAL_COMMUNITY): Payer: Self-pay

## 2013-10-21 DIAGNOSIS — O4700 False labor before 37 completed weeks of gestation, unspecified trimester: Secondary | ICD-10-CM | POA: Insufficient documentation

## 2013-10-21 DIAGNOSIS — O212 Late vomiting of pregnancy: Secondary | ICD-10-CM | POA: Insufficient documentation

## 2013-10-21 LAB — URINE MICROSCOPIC-ADD ON

## 2013-10-21 LAB — URINALYSIS, ROUTINE W REFLEX MICROSCOPIC
Glucose, UA: NEGATIVE mg/dL
Hgb urine dipstick: NEGATIVE
Ketones, ur: 15 mg/dL — AB
NITRITE: NEGATIVE
PROTEIN: NEGATIVE mg/dL
SPECIFIC GRAVITY, URINE: 1.02 (ref 1.005–1.030)
Urobilinogen, UA: >8 mg/dL — ABNORMAL HIGH (ref 0.0–1.0)
pH: 6.5 (ref 5.0–8.0)

## 2013-10-21 MED ORDER — ONDANSETRON 8 MG PO TBDP
8.0000 mg | ORAL_TABLET | Freq: Once | ORAL | Status: AC
  Administered 2013-10-21: 8 mg via ORAL
  Filled 2013-10-21: qty 1

## 2013-10-21 NOTE — MAU Provider Note (Signed)
History     CSN: [id number]  Arrival date and time: 10/21/13 0505   None     Chief Complaint  Patient presents with  . Back Pain  . Contractions  . Nausea   Back Pain    EVOLA HOLLIS is a 23 y.o. [phone] at [redacted]w[redacted]d who presents today because of contractions and she feels like she may be in labor. She also had a stomach virus last week, and she states that she feels like she hasn't recovered. She is requesting something for nausea. She denies any bleeding or LOF. She states that she has had some mucous. She states that the baby has been moving normally. She denies any complications with the pregnancy. Her next appointment is on Monday.   Past Medical History  Diagnosis Date  . Anemia   . Urinary tract infection   . Anxiety   . Asthma     as a child    Past Surgical History  Procedure Laterality Date  . Dilation and curettage of uterus      No family history on file.  History  Substance Use Topics  . Smoking status: Never Smoker   . Smokeless tobacco: Never Used  . Alcohol Use: No    Allergies:  Allergies  Allergen Reactions  . Amoxicillin Nausea And Vomiting  . Cinnamon Swelling and Other (See Comments)    Mouth swelling. Pt states it is a minor allergy     Prescriptions prior to admission  Medication Sig Dispense Refill  . acetaminophen (TYLENOL) 500 MG tablet Take 1,000 mg by mouth every 6 (six) hours as needed for moderate pain or headache.       . diphenhydrAMINE (BENADRYL) 25 mg capsule Take 50 mg by mouth every 6 (six) hours as needed for allergies or sleep. Allergies      . Prenatal Vit-Fe Fumarate-FA (PRENATAL MULTIVITAMIN) TABS tablet Take 1 tablet by mouth daily at 12 noon.        Review of Systems  Musculoskeletal: Positive for back pain.   Physical Exam   Blood pressure 112/74, pulse 96, temperature 97.6 F (36.4 C), temperature source Oral, resp. rate 18, height 5\' 3"  (1.6 m), weight 63.504 kg (140 lb), last menstrual period  02/05/2013.  Physical Exam  Nursing note and vitals reviewed. Constitutional: She is oriented to person, place, and time. She appears well-developed and well-nourished. No distress.  Cardiovascular: Normal rate.   Respiratory: Effort normal.  GI: There is no tenderness.  Neurological: She is alert and oriented to person, place, and time.  Skin: Skin is warm and dry.  Psychiatric: She has a normal mood and affect.   No cervical change, per RN, from last exam here.  FHT 115-125, moderate with 15x15 accels, no decel Toco: q 3-5 min ctx MAU Course  Procedures  Results for orders placed during the hospital encounter of 10/21/13 (from the past 24 hour(s))  URINALYSIS, ROUTINE W REFLEX MICROSCOPIC     Status: Abnormal   Collection Time    10/21/13  5:50 AM      Result Value Ref Range   Color, Urine YELLOW  YELLOW   APPearance CLEAR  CLEAR   Specific Gravity, Urine 1.020  1.005 - 1.030   pH 6.5  5.0 - 8.0   Glucose, UA NEGATIVE  NEGATIVE mg/dL   Hgb urine dipstick NEGATIVE  NEGATIVE   Bilirubin Urine SMALL (*) NEGATIVE   Ketones, ur 15 (*) NEGATIVE mg/dL   Protein, ur NEGATIVE  NEGATIVE  mg/dL   Urobilinogen, UA >4.0>8.0 (*) 0.0 - 1.0 mg/dL   Nitrite NEGATIVE  NEGATIVE   Leukocytes, UA SMALL (*) NEGATIVE  URINE MICROSCOPIC-ADD ON     Status: Abnormal   Collection Time    10/21/13  5:50 AM      Result Value Ref Range   Squamous Epithelial / LPF MANY (*) RARE   WBC, UA 7-10  <3 WBC/hpf   RBC / HPF 0-2  <3 RBC/hpf   Bacteria, UA MANY (*) RARE   Crystals CA OXALATE CRYSTALS (*) NEGATIVE   Urine-Other MUCOUS PRESENT      [phone]: Will give Zofran ODT and recheck cervix at 0700. RN will report to attending MD. Assessment and Plan  Nausea Labor evaluation Will treat nausea here in MAU RN will report to attending MD about labor progress.   Tawnya CrookHogan, Christien Berthelot Donovan 10/21/2013, 6:26 AM

## 2013-10-21 NOTE — Discharge Instructions (Signed)
Braxton Hicks Contractions Contractions of the uterus can occur throughout pregnancy. Contractions are not always a sign that you are in labor.  WHAT ARE BRAXTON HICKS CONTRACTIONS?  Contractions that occur before labor are called Braxton Hicks contractions, or false labor. Toward the end of pregnancy (32-34 weeks), these contractions can develop more often and may become more forceful. This is not true labor because these contractions do not result in opening (dilatation) and thinning of the cervix. They are sometimes difficult to tell apart from true labor because these contractions can be forceful and people have different pain tolerances. You should not feel embarrassed if you go to the hospital with false labor. Sometimes, the only way to tell if you are in true labor is for your health care provider to look for changes in the cervix. If there are no prenatal problems or other health problems associated with the pregnancy, it is completely safe to be sent home with false labor and await the onset of true labor. HOW CAN YOU TELL THE DIFFERENCE BETWEEN TRUE AND FALSE LABOR? False Labor  The contractions of false labor are usually shorter and not as hard as those of true labor.   The contractions are usually irregular.   The contractions are often felt in the front of the lower abdomen and in the groin.   The contractions may go away when you walk around or change positions while lying down.   The contractions get weaker and are shorter lasting as time goes on.   The contractions do not usually become progressively stronger, regular, and closer together as with true labor.  True Labor  Contractions in true labor last 30-70 seconds, become very regular, usually become more intense, and increase in frequency.   The contractions do not go away with walking.   The discomfort is usually felt in the top of the uterus and spreads to the lower abdomen and low back.   True labor can be  determined by your health care provider with an exam. This will show that the cervix is dilating and getting thinner.  WHAT TO REMEMBER  Keep up with your usual exercises and follow other instructions given by your health care provider.   Take medicines as directed by your health care provider.   Keep your regular prenatal appointments.   Eat and drink lightly if you think you are going into labor.   If Braxton Hicks contractions are making you uncomfortable:   Change your position from lying down or resting to walking, or from walking to resting.   Sit and rest in a tub of warm water.   Drink 2-3 glasses of water. Dehydration may cause these contractions.   Do slow and deep breathing several times an hour.  WHEN SHOULD I SEEK IMMEDIATE MEDICAL CARE? Seek immediate medical care if:  Your contractions become stronger, more regular, and closer together.   You have fluid leaking or gushing from your vagina.   You have a fever.   You pass blood-tinged mucus.   You have vaginal bleeding.   You have continuous abdominal pain.   You have low back pain that you never had before.   You feel your baby's head pushing down and causing pelvic pressure.   Your baby is not moving as much as it used to.  Document Released: 03/03/2005 Document Revised: 03/08/2013 Document Reviewed: 12/13/2012 [employer] Patient Information 2015 [employer]. This information is not intended to replace advice given to you by your health care   provider. Make sure you discuss any questions you have with your health care provider.  Fetal Movement Counts Patient Name: __________________________________________________ Patient Due Date: ____________________ Performing a fetal movement count is highly recommended in high-risk pregnancies, but it is good for every pregnant woman to do. Your health care provider may ask you to start counting fetal movements at 28 weeks of the pregnancy. Fetal  movements often increase:  After eating a full meal.  After physical activity.  After eating or drinking something sweet or cold.  At rest. Pay attention to when you feel the baby is most active. This will help you notice a pattern of your baby's sleep and wake cycles and what factors contribute to an increase in fetal movement. It is important to perform a fetal movement count at the same time each day when your baby is normally most active.  HOW TO COUNT FETAL MOVEMENTS 1. Find a quiet and comfortable area to sit or lie down on your left side. Lying on your left side provides the best blood and oxygen circulation to your baby. 2. Write down the day and time on a sheet of paper or in a journal. 3. Start counting kicks, flutters, swishes, rolls, or jabs in a 2-hour period. You should feel at least 10 movements within 2 hours. 4. If you do not feel 10 movements in 2 hours, wait 2-3 hours and count again. Look for a change in the pattern or not enough counts in 2 hours. SEEK MEDICAL CARE IF:  You feel less than 10 counts in 2 hours, tried twice.  There is no movement in over an hour.  The pattern is changing or taking longer each day to reach 10 counts in 2 hours.  You feel the baby is not moving as he or she usually does. Date: ____________ Movements: ____________ Start time: ____________ Finish time: ____________  Date: ____________ Movements: ____________ Start time: ____________ Finish time: ____________ Date: ____________ Movements: ____________ Start time: ____________ Finish time: ____________ Date: ____________ Movements: ____________ Start time: ____________ Finish time: ____________ Date: ____________ Movements: ____________ Start time: ____________ Finish time: ____________ Date: ____________ Movements: ____________ Start time: ____________ Finish time: ____________ Date: ____________ Movements: ____________ Start time: ____________ Finish time: ____________ Date: ____________  Movements: ____________ Start time: ____________ Finish time: ____________  Date: ____________ Movements: ____________ Start time: ____________ Finish time: ____________ Date: ____________ Movements: ____________ Start time: ____________ Finish time: ____________ Date: ____________ Movements: ____________ Start time: ____________ Finish time: ____________ Date: ____________ Movements: ____________ Start time: ____________ Finish time: ____________ Date: ____________ Movements: ____________ Start time: ____________ Finish time: ____________ Date: ____________ Movements: ____________ Start time: ____________ Finish time: ____________ Date: ____________ Movements: ____________ Start time: ____________ Finish time: ____________  Date: ____________ Movements: ____________ Start time: ____________ Finish time: ____________ Date: ____________ Movements: ____________ Start time: ____________ Finish time: ____________ Date: ____________ Movements: ____________ Start time: ____________ Finish time: ____________ Date: ____________ Movements: ____________ Start time: ____________ Finish time: ____________ Date: ____________ Movements: ____________ Start time: ____________ Finish time: ____________ Date: ____________ Movements: ____________ Start time: ____________ Finish time: ____________ Date: ____________ Movements: ____________ Start time: ____________ Finish time: ____________  Date: ____________ Movements: ____________ Start time: ____________ Finish time: ____________ Date: ____________ Movements: ____________ Start time: ____________ Finish time: ____________ Date: ____________ Movements: ____________ Start time: ____________ Finish time: ____________ Date: ____________ Movements: ____________ Start time: ____________ Finish time: ____________ Date: ____________ Movements: ____________ Start time: ____________ Finish time: ____________ Date: ____________ Movements: ____________ Start time:  ____________ Finish time: ____________ Date: ____________ Movements:   ____________ Start time: ____________ Finish time: ____________  Date: ____________ Movements: ____________ Start time: ____________ Finish time: ____________ Date: ____________ Movements: ____________ Start time: ____________ Finish time: ____________ Date: ____________ Movements: ____________ Start time: ____________ Finish time: ____________ Date: ____________ Movements: ____________ Start time: ____________ Finish time: ____________ Date: ____________ Movements: ____________ Start time: ____________ Finish time: ____________ Date: ____________ Movements: ____________ Start time: ____________ Finish time: ____________ Date: ____________ Movements: ____________ Start time: ____________ Finish time: ____________  Date: ____________ Movements: ____________ Start time: ____________ Finish time: ____________ Date: ____________ Movements: ____________ Start time: ____________ Finish time: ____________ Date: ____________ Movements: ____________ Start time: ____________ Finish time: ____________ Date: ____________ Movements: ____________ Start time: ____________ Finish time: ____________ Date: ____________ Movements: ____________ Start time: ____________ Finish time: ____________ Date: ____________ Movements: ____________ Start time: ____________ Finish time: ____________ Date: ____________ Movements: ____________ Start time: ____________ Finish time: ____________  Date: ____________ Movements: ____________ Start time: ____________ Finish time: ____________ Date: ____________ Movements: ____________ Start time: ____________ Finish time: ____________ Date: ____________ Movements: ____________ Start time: ____________ Finish time: ____________ Date: ____________ Movements: ____________ Start time: ____________ Finish time: ____________ Date: ____________ Movements: ____________ Start time: ____________ Finish time: ____________ Date:  ____________ Movements: ____________ Start time: ____________ Finish time: ____________ Date: ____________ Movements: ____________ Start time: ____________ Finish time: ____________  Date: ____________ Movements: ____________ Start time: ____________ Finish time: ____________ Date: ____________ Movements: ____________ Start time: ____________ Finish time: ____________ Date: ____________ Movements: ____________ Start time: ____________ Finish time: ____________ Date: ____________ Movements: ____________ Start time: ____________ Finish time: ____________ Date: ____________ Movements: ____________ Start time: ____________ Finish time: ____________ Date: ____________ Movements: ____________ Start time: ____________ Finish time: ____________ Document Released: 04/02/2006 Document Revised: 07/18/2013 Document Reviewed: 12/29/2011 [employer] Patient Information 2015 [employer]. This information is not intended to replace advice given to you by your health care provider. Make sure you discuss any questions you have with your health care provider.  

## 2013-10-21 NOTE — MAU Note (Signed)
States had stomach bug since Sunday; everyone in house was sick. States still hasn't recovered & has trouble eating without feeling nauseated. Last vomited Wednesday night. States feels dehydrated. Denies LOF or vaginal bleeding. Positive fetal movement. Chills at home & thinks has had fevers but hasn't checked temp.

## 2013-10-23 ENCOUNTER — Encounter (HOSPITAL_COMMUNITY): Payer: BC Managed Care – PPO | Admitting: Anesthesiology

## 2013-10-23 ENCOUNTER — Inpatient Hospital Stay (HOSPITAL_COMMUNITY): Payer: BC Managed Care – PPO | Admitting: Anesthesiology

## 2013-10-23 ENCOUNTER — Inpatient Hospital Stay (HOSPITAL_COMMUNITY)
Admission: AD | Admit: 2013-10-23 | Discharge: 2013-10-25 | DRG: 775 | Disposition: A | Discharge status: Home / Self Care | Payer: BC Managed Care – PPO | Source: Ambulatory Visit | Attending: Obstetrics and Gynecology | Admitting: Obstetrics and Gynecology

## 2013-10-23 ENCOUNTER — Encounter (HOSPITAL_COMMUNITY): Payer: Self-pay | Admitting: *Deleted

## 2013-10-23 DIAGNOSIS — O99344 Other mental disorders complicating childbirth: Secondary | ICD-10-CM | POA: Diagnosis present

## 2013-10-23 DIAGNOSIS — F411 Generalized anxiety disorder: Secondary | ICD-10-CM | POA: Diagnosis present

## 2013-10-23 DIAGNOSIS — J45909 Unspecified asthma, uncomplicated: Secondary | ICD-10-CM | POA: Diagnosis present

## 2013-10-23 DIAGNOSIS — O9902 Anemia complicating childbirth: Secondary | ICD-10-CM | POA: Diagnosis present

## 2013-10-23 DIAGNOSIS — D649 Anemia, unspecified: Secondary | ICD-10-CM | POA: Diagnosis present

## 2013-10-23 LAB — CBC
HEMATOCRIT: 27.8 % — AB (ref 36.0–46.0)
Hemoglobin: 7.9 g/dL — ABNORMAL LOW (ref 12.0–15.0)
MCH: 19.5 pg — ABNORMAL LOW (ref 26.0–34.0)
MCHC: 28.4 g/dL — ABNORMAL LOW (ref 30.0–36.0)
MCV: 68.5 fL — ABNORMAL LOW (ref 78.0–100.0)
Platelets: 292 10*3/uL (ref 150–400)
RBC: 4.06 MIL/uL (ref 3.87–5.11)
RDW: 18.7 % — ABNORMAL HIGH (ref 11.5–15.5)
WBC: 8.9 10*3/uL (ref 4.0–10.5)

## 2013-10-23 LAB — TYPE AND SCREEN
ABO/RH(D): A POS
Antibody Screen: NEGATIVE

## 2013-10-23 LAB — ABO/RH: ABO/RH(D): A POS

## 2013-10-23 MED ORDER — LACTATED RINGERS IV SOLN
500.0000 mL | INTRAVENOUS | Status: DC | PRN
  Administered 2013-10-23: 1000 mL via INTRAVENOUS
  Administered 2013-10-23: 500 mL via INTRAVENOUS

## 2013-10-23 MED ORDER — OXYTOCIN 40 UNITS IN LACTATED RINGERS INFUSION - SIMPLE MED: 62.5000 mL/h | INTRAVENOUS | Status: DC

## 2013-10-23 MED ORDER — PHENYLEPHRINE 40 MCG/ML (10ML) SYRINGE FOR IV PUSH (FOR BLOOD PRESSURE SUPPORT)
80.0000 ug | PREFILLED_SYRINGE | INTRAVENOUS | Status: DC | PRN
  Administered 2013-10-23 (×2): 80 ug via INTRAVENOUS
  Filled 2013-10-23: qty 2

## 2013-10-23 MED ORDER — OXYTOCIN BOLUS FROM INFUSION
500.0000 mL | INTRAVENOUS | Status: DC
  Administered 2013-10-24: 500 mL via INTRAVENOUS

## 2013-10-23 MED ORDER — IBUPROFEN 600 MG PO TABS: 600.0000 mg | ORAL_TABLET | Freq: Four times a day (QID) | ORAL | Status: DC | PRN

## 2013-10-23 MED ORDER — EPHEDRINE 5 MG/ML INJ
10.0000 mg | INTRAVENOUS | Status: DC | PRN
  Filled 2013-10-23: qty 4
  Filled 2013-10-23: qty 2

## 2013-10-23 MED ORDER — ACETAMINOPHEN 325 MG PO TABS: 650.0000 mg | ORAL_TABLET | ORAL | Status: DC | PRN

## 2013-10-23 MED ORDER — TERBUTALINE SULFATE 1 MG/ML IJ SOLN: 0.2500 mg | Freq: Once | INTRAMUSCULAR | Status: AC | PRN

## 2013-10-23 MED ORDER — PHENYLEPHRINE 40 MCG/ML (10ML) SYRINGE FOR IV PUSH (FOR BLOOD PRESSURE SUPPORT)
80.0000 ug | PREFILLED_SYRINGE | INTRAVENOUS | Status: DC | PRN
  Filled 2013-10-23: qty 2
  Filled 2013-10-23 (×2): qty 10

## 2013-10-23 MED ORDER — LACTATED RINGERS IV SOLN: 500.0000 mL | Freq: Once | INTRAVENOUS | Status: DC

## 2013-10-23 MED ORDER — LACTATED RINGERS IV SOLN
INTRAVENOUS | Status: DC
  Administered 2013-10-23 (×2): via INTRAVENOUS

## 2013-10-23 MED ORDER — CITRIC ACID-SODIUM CITRATE 334-500 MG/5ML PO SOLN: 30.0000 mL | ORAL | Status: DC | PRN

## 2013-10-23 MED ORDER — FENTANYL 2.5 MCG/ML BUPIVACAINE 1/10 % EPIDURAL INFUSION (WH - ANES)
14.0000 mL/h | INTRAMUSCULAR | Status: DC | PRN
  Administered 2013-10-23: 14 mL/h via EPIDURAL
  Filled 2013-10-23: qty 125

## 2013-10-23 MED ORDER — LIDOCAINE HCL (PF) 1 % IJ SOLN
INTRAMUSCULAR | Status: DC | PRN
  Administered 2013-10-23 (×2): 4 mL

## 2013-10-23 MED ORDER — EPHEDRINE 5 MG/ML INJ
10.0000 mg | INTRAVENOUS | Status: DC | PRN
  Filled 2013-10-23: qty 2

## 2013-10-23 MED ORDER — BUTORPHANOL TARTRATE 1 MG/ML IJ SOLN: 1.0000 mg | INTRAMUSCULAR | Status: DC | PRN

## 2013-10-23 MED ORDER — OXYCODONE-ACETAMINOPHEN 5-325 MG PO TABS: 1.0000 | ORAL_TABLET | ORAL | Status: DC | PRN

## 2013-10-23 MED ORDER — ZOLPIDEM TARTRATE 5 MG PO TABS: 5.0000 mg | ORAL_TABLET | Freq: Every evening | ORAL | Status: DC | PRN

## 2013-10-23 MED ORDER — OXYTOCIN 40 UNITS IN LACTATED RINGERS INFUSION - SIMPLE MED
1.0000 m[IU]/min | INTRAVENOUS | Status: DC
  Administered 2013-10-23: 2 m[IU]/min via INTRAVENOUS
  Administered 2013-10-23: 4 m[IU]/min via INTRAVENOUS
  Filled 2013-10-23 (×2): qty 1000

## 2013-10-23 MED ORDER — CITRIC ACID-SODIUM CITRATE 334-500 MG/5ML PO SOLN
ORAL | Status: AC
  Filled 2013-10-23: qty 15

## 2013-10-23 MED ORDER — FENTANYL 2.5 MCG/ML BUPIVACAINE 1/10 % EPIDURAL INFUSION (WH - ANES)
INTRAMUSCULAR | Status: DC | PRN
  Administered 2013-10-23: 14 mL/h via EPIDURAL

## 2013-10-23 MED ORDER — ONDANSETRON HCL 4 MG/2ML IJ SOLN: 4.0000 mg | Freq: Four times a day (QID) | INTRAMUSCULAR | Status: DC | PRN

## 2013-10-23 MED ORDER — DIPHENHYDRAMINE HCL 50 MG/ML IJ SOLN: 12.5000 mg | INTRAMUSCULAR | Status: DC | PRN

## 2013-10-23 MED ORDER — LIDOCAINE HCL (PF) 1 % IJ SOLN
30.0000 mL | INTRAMUSCULAR | Status: DC | PRN
  Administered 2013-10-24: 30 mL via SUBCUTANEOUS
  Filled 2013-10-23 (×2): qty 30

## 2013-10-23 NOTE — Anesthesia Preprocedure Evaluation (Signed)
Anesthesia Evaluation  Patient identified by MRN, date of birth, ID band Patient awake    Reviewed: Allergy & Precautions, H&P , Patient's Chart, lab work & pertinent test results  Airway Mallampati: II TM Distance: >3 FB Neck ROM: Full    Dental no notable dental hx. (+) Teeth Intact   Pulmonary asthma ,  breath sounds clear to auscultation  Pulmonary exam normal       Cardiovascular negative cardio ROS  Rhythm:Regular Rate:Normal     Neuro/Psych Anxiety negative neurological ROS     GI/Hepatic negative GI ROS, Neg liver ROS,   Endo/Other  negative endocrine ROS  Renal/GU negative Renal ROS  negative genitourinary   Musculoskeletal   Abdominal (+) - obese,   Peds  Hematology  (+) anemia ,   Anesthesia Other Findings   Reproductive/Obstetrics (+) Pregnancy                           Anesthesia Physical Anesthesia Plan  ASA: II  Anesthesia Plan: Epidural   Post-op Pain Management:    Induction:   Airway Management Planned: Natural Airway  Additional Equipment:   Intra-op Plan:   Post-operative Plan:   Informed Consent: I have reviewed the patients History and Physical, chart, labs and discussed the procedure including the risks, benefits and alternatives for the proposed anesthesia with the patient or authorized representative who has indicated his/her understanding and acceptance.     Plan Discussed with: Anesthesiologist  Anesthesia Plan Comments:         Anesthesia Quick Evaluation

## 2013-10-23 NOTE — Progress Notes (Signed)
Patient ID: Lauren Griffith, female   DOB: 07/03/1990, 23 y.o.   MRN: [id number]  S: Pt comfortable with epidural O: Filed Vitals:   10/23/13 2300 10/23/13 2301 10/23/13 2330 10/23/13 2331  BP: 111/68 111/68 114/63 114/63  Pulse: 77 77 81 81  Temp:      TempSrc:      Resp: 20   20  Height:      Weight:      SpO2:       FHR 120-150 reactive, class I tracing Cvx 4/70/-2 toco Q2-3  AROM clear fluid  A/P 1) FWB reassuring

## 2013-10-23 NOTE — H&P (Signed)
Lauren Griffith is a 23 y.o. female presenting for labor  23 yo [phone] @ 37+1 called stating she was in the car trying not to push. Upon arrival to MAU she was immediately brought to room 171. She was found to be 2/50/-2 and admitted for labor History OB History   Grav Para Term Preterm Abortions TAB SAB Ect Mult Living   4 2 2  1 1    2      Past Medical History  Diagnosis Date  . Anemia   . Urinary tract infection   . Anxiety   . Asthma     as a child   Past Surgical History  Procedure Laterality Date  . Dilation and curettage of uterus     Family History: family history is not on file. Social History:  reports that she has never smoked. She has never used smokeless tobacco. She reports that she does not drink alcohol or use illicit drugs.   Prenatal Transfer Tool  Maternal Diabetes: No Genetic Screening: Normal Maternal Ultrasounds/Referrals: Normal Fetal Ultrasounds or other Referrals:  None Maternal Substance Abuse:  No Significant Maternal Medications:  None Significant Maternal Lab Results:  Lab values include: Other: Hgb 8.8 Other Comments:  None  ROS: as above  Dilation: 2 Effacement (%): 50 Station: -2 Exam by:: Alexxis Mackert Blood pressure 128/82, pulse 98, temperature 98 F (36.7 C), resp. rate 22, height 5\' 3"  (1.6 m), weight 63.504 kg (140 lb), last menstrual period 02/05/2013. Exam Physical Exam  Prenatal labs: ABO, Rh:  A positive Antibody:  Negative Rubella:  Immune RPR:   NR HBsAg:   Neg HIV:   NR GBS:   Negative  Assessment/Plan: 1) Admit 2) Vertex confirmed by US 3) Epidural   Marien Manship H. 10/23/2013, 6:58 PM

## 2013-10-23 NOTE — Anesthesia Procedure Notes (Signed)
Epidural Patient location during procedure: OB Start time: 10/23/2013 7:01 PM  Staffing Anesthesiologist: Tarea Skillman A. Performed by: anesthesiologist   Preanesthetic Checklist Completed: patient identified, site marked, surgical consent, pre-op evaluation, timeout performed, IV checked, risks and benefits discussed and monitors and equipment checked  Epidural Patient position: sitting Prep: site prepped and draped and DuraPrep Patient monitoring: continuous pulse ox and blood pressure Approach: midline Location: L4-L5 Injection technique: LOR air  Needle:  Needle type: Tuohy  Needle gauge: 17 G Needle length: 9 cm and 9 Needle insertion depth: 4 cm Catheter type: closed end flexible Catheter size: 19 Gauge Catheter at skin depth: 9 cm Test dose: negative and Other  Assessment Events: blood not aspirated, injection not painful, no injection resistance, negative IV test and no paresthesia  Additional Notes Patient identified. Risks and benefits discussed including failed block, incomplete  Pain control, post dural puncture headache, nerve damage, paralysis, blood pressure Changes, nausea, vomiting, reactions to medications-both toxic and allergic and post Partum back pain. All questions were answered. Patient expressed understanding and wished to proceed. Sterile technique was used throughout procedure. Epidural site was Dressed with sterile barrier dressing. No paresthesias, signs of intravascular injection Or signs of intrathecal spread were encountered.  Patient was more comfortable after the epidural was dosed. Please see RN's note for documentation of vital signs and FHR which are stable.

## 2013-10-24 ENCOUNTER — Encounter (HOSPITAL_COMMUNITY): Payer: Self-pay | Admitting: *Deleted

## 2013-10-24 LAB — CBC
HCT: 25.2 % — ABNORMAL LOW (ref 36.0–46.0)
Hemoglobin: 7.2 g/dL — ABNORMAL LOW (ref 12.0–15.0)
MCH: 19.8 pg — AB (ref 26.0–34.0)
MCHC: 28.6 g/dL — AB (ref 30.0–36.0)
MCV: 69.2 fL — ABNORMAL LOW (ref 78.0–100.0)
PLATELETS: 267 10*3/uL (ref 150–400)
RBC: 3.64 MIL/uL — ABNORMAL LOW (ref 3.87–5.11)
RDW: 19.4 % — AB (ref 11.5–15.5)
WBC: 14.6 10*3/uL — ABNORMAL HIGH (ref 4.0–10.5)

## 2013-10-24 LAB — RPR

## 2013-10-24 MED ORDER — ZOLPIDEM TARTRATE 5 MG PO TABS: 5.0000 mg | ORAL_TABLET | Freq: Every evening | ORAL | Status: DC | PRN

## 2013-10-24 MED ORDER — DIPHENHYDRAMINE HCL 25 MG PO CAPS: 25.0000 mg | ORAL_CAPSULE | Freq: Four times a day (QID) | ORAL | Status: DC | PRN

## 2013-10-24 MED ORDER — BENZOCAINE-MENTHOL 20-0.5 % EX AERO
1.0000 | INHALATION_SPRAY | CUTANEOUS | Status: DC | PRN
Start: 2013-10-24 — End: 2013-10-25
  Administered 2013-10-24: 1 via TOPICAL
  Filled 2013-10-24: qty 56

## 2013-10-24 MED ORDER — OXYCODONE-ACETAMINOPHEN 5-325 MG PO TABS
1.0000 | ORAL_TABLET | ORAL | Status: DC | PRN
  Administered 2013-10-24 (×4): 2 via ORAL
  Administered 2013-10-25: 1 via ORAL
  Administered 2013-10-25: 2 via ORAL
  Filled 2013-10-24 (×2): qty 1
  Filled 2013-10-24 (×2): qty 2
  Filled 2013-10-24: qty 1
  Filled 2013-10-24 (×2): qty 2

## 2013-10-24 MED ORDER — LANOLIN HYDROUS EX OINT: TOPICAL_OINTMENT | CUTANEOUS | Status: DC | PRN

## 2013-10-24 MED ORDER — METHYLERGONOVINE MALEATE 0.2 MG PO TABS: 0.2000 mg | ORAL_TABLET | ORAL | Status: DC | PRN

## 2013-10-24 MED ORDER — DIBUCAINE 1 % RE OINT
1.0000 | TOPICAL_OINTMENT | RECTAL | Status: DC | PRN
Start: 2013-10-24 — End: 2013-10-25

## 2013-10-24 MED ORDER — ONDANSETRON HCL 4 MG PO TABS
4.0000 mg | ORAL_TABLET | ORAL | Status: DC | PRN
  Administered 2013-10-24: 4 mg via ORAL
  Filled 2013-10-24: qty 1

## 2013-10-24 MED ORDER — SENNOSIDES-DOCUSATE SODIUM 8.6-50 MG PO TABS
2.0000 | ORAL_TABLET | ORAL | Status: DC
  Administered 2013-10-24: 2 via ORAL
  Filled 2013-10-24: qty 2

## 2013-10-24 MED ORDER — TETANUS-DIPHTH-ACELL PERTUSSIS 5-2.5-18.5 LF-MCG/0.5 IM SUSP: 0.5000 mL | Freq: Once | INTRAMUSCULAR | Status: DC

## 2013-10-24 MED ORDER — ONDANSETRON HCL 4 MG/2ML IJ SOLN: 4.0000 mg | INTRAMUSCULAR | Status: DC | PRN

## 2013-10-24 MED ORDER — METHYLERGONOVINE MALEATE 0.2 MG/ML IJ SOLN: 0.2000 mg | INTRAMUSCULAR | Status: DC | PRN

## 2013-10-24 MED ORDER — IBUPROFEN 600 MG PO TABS
600.0000 mg | ORAL_TABLET | Freq: Four times a day (QID) | ORAL | Status: DC
  Administered 2013-10-24 – 2013-10-25 (×6): 600 mg via ORAL
  Filled 2013-10-24 (×6): qty 1

## 2013-10-24 MED ORDER — PRENATAL MULTIVITAMIN CH
1.0000 | ORAL_TABLET | Freq: Every day | ORAL | Status: DC
  Administered 2013-10-24 – 2013-10-25 (×2): 1 via ORAL
  Filled 2013-10-24 (×2): qty 1

## 2013-10-24 MED ORDER — SIMETHICONE 80 MG PO CHEW: 80.0000 mg | CHEWABLE_TABLET | ORAL | Status: DC | PRN

## 2013-10-24 MED ORDER — WITCH HAZEL-GLYCERIN EX PADS: 1.0000 "application " | MEDICATED_PAD | CUTANEOUS | Status: DC | PRN

## 2013-10-24 NOTE — Lactation Note (Signed)
This note was copied from the chart of Lauren Aldona LentoKesley Maranto. Lactation Consultation Note  Patient Name: Lauren Griffith BJYNW'GToday's Date: 10/24/2013 Reason for consult: Initial assessment Baby asleep at this visit, Mom reports she recently tried to latch and baby would not wake to BF. Baby has been to the breast at least 4 times since birth, adequate voids/stools. BF basics reviewed with Mom. Lactation brochure left for review. Advised of OP services and support group. Encouraged to call for questions/concerns or would like help w/latch.   Maternal Data Formula Feeding for Exclusion: No Has patient been taught Hand Expression?: Yes (per Mom's report) Does the patient have breastfeeding experience prior to this delivery?: Yes  Feeding    LATCH Score/Interventions                      Lactation Tools Discussed/Used WIC Program: No   Consult Status Consult Status: Follow-up Date: 10/25/13 Follow-up type: In-patient    Lauren Griffith, Lauren Griffith 10/24/2013, 2:30 PM

## 2013-10-24 NOTE — Anesthesia Postprocedure Evaluation (Signed)
Anesthesia Post Note  Patient: Lauren Griffith  Procedure(s) Performed: * No procedures listed *  Anesthesia type: Epidural  Patient location: Mother/Baby  Post pain: Pain level controlled  Post assessment: Post-op Vital signs reviewed  Last Vitals:  Filed Vitals:   10/24/13 0545  BP:   Pulse:   Temp: 37.4 C  Resp: 20    Post vital signs: Reviewed  Level of consciousness: awake  Complications: No apparent anesthesia complications

## 2013-10-25 NOTE — Discharge Summary (Signed)
Obstetric Discharge Summary Reason for Admission: onset of labor Prenatal Procedures: none Intrapartum Procedures: spontaneous vaginal delivery Postpartum Procedures: none Complications-Operative and Postpartum: second degree perineal laceration Hemoglobin  Date Value Ref Range Status  10/24/2013 7.2* 12.0 - 15.0 g/dL Final     HCT  Date Value Ref Range Status  10/24/2013 25.2* 36.0 - 46.0 % Final     Discharge Diagnoses: Term Pregnancy-delivered  Discharge Information: Date: 10/25/2013 Activity: pelvic rest Diet: routine Medications: PNV, Ibuprofen and Iron Condition: stable Instructions: refer to practice specific booklet Discharge to: home Follow-up Information   Follow up with Almon HerculesOSS,KENDRA H., MD In 4 weeks.   Specialty:  Obstetrics and Gynecology   Contact information:   [address] [place] [address] [phone]       Newborn Data: Live born female  Birth Weight: 7 lb 3.3 oz (3270 g) APGAR: 9, 9  Home with mother.  Lauren Griffith A 10/25/2013, 8:30 AM

## 2013-10-25 NOTE — Progress Notes (Signed)
Patient is eating, ambulating, voiding.  Pain control is good.  Filed Vitals:   10/24/13 0545 10/24/13 1030 10/24/13 1810 10/25/13 0500  BP: 112/72 110/69 104/59 107/69  Pulse: 81 89 81 74  Temp: 99.3 F (37.4 C) 97.7 F (36.5 C) 98.1 F (36.7 C) 98.1 F (36.7 C)  TempSrc: Oral Oral Oral Oral  Resp: 20 18 18 18   Height:      Weight:      SpO2:        Fundus firm Perineum without swelling.  Lab Results  Component Value Date   WBC 14.6* 10/24/2013   HGB 7.2* 10/24/2013   HCT 25.2* 10/24/2013   MCV 69.2* 10/24/2013   PLT 267 10/24/2013    --/--/A POS, A POS (08/09 1815)/RI  A/P Post partum day 1.  Routine care.  Expect d/c routine.    Barak Bialecki A

## 2014-01-16 ENCOUNTER — Encounter (HOSPITAL_COMMUNITY): Payer: Self-pay | Admitting: *Deleted

## 2014-01-16 DIAGNOSIS — F32A Depression, unspecified: Secondary | ICD-10-CM | POA: Insufficient documentation

## 2015-10-28 IMAGING — US US OB DETAIL+14 WK
1 series · 12 of 28 positions shown · non-contrast
Comparison: none

OBSTETRICS REPORT
                      (Signed Final 07/06/2013 [DATE])

Service(s) Provided
 US OB DETAIL + 14 WK                                  76811.0
Indications
 Detailed fetal anatomic survey
 Fetal abnormality - other known or suspected
 (enlarged CM )
Fetal Evaluation
 Num Of Fetuses:    1
 Fetal Heart Rate:  157                          bpm
 Cardiac Activity:  Observed
 Presentation:      Cephalic
 Placenta:          Posterior, above cervical
                    os
 P. Cord            Visualized
 Insertion:
 Amniotic Fluid
 AFI FV:      Subjectively within normal limits
                                             Larg Pckt:     4.4  cm
Biometry
 BPD:     52.2  mm     G. Age:  21w 6d                CI:         70.8   70 - 86
 OFD:     73.7  mm                                    FL/HC:      17.8   18.4 -
 HC:     201.5  mm     G. Age:  22w 2d       70  %    HC/AC:      1.15   1.06 -
 AC:     174.8  mm     G. Age:  22w 3d       69  %    FL/BPD:     68.8   71 - 87
 FL:      35.9  mm     G. Age:  21w 3d       34  %    FL/AC:      20.5   20 - 24
 HUM:     34.6  mm     G. Age:  21w 6d       55  %
 CER:     22.2  mm     G. Age:  21w 1d       34  %
 Est. FW:     465  gm           1 lb     52  %
Gestational Age
 LMP:           21w 4d        Date:  02/05/13                 EDD:   11/12/13
 U/S Today:     22w 0d                                        EDD:   11/09/13
 Best:          21w 4d     Det. By:  LMP  (02/05/13)          EDD:   11/12/13
Anatomy
 Cranium:          Appears normal         Aortic Arch:      Appears normal
 Fetal Cavum:      Appears normal         Ductal Arch:      Appears normal
 Ventricles:       Appears normal         Diaphragm:        Appears normal
 Choroid Plexus:   Appears normal         Stomach:          Appears normal, left
                                                            sided
 Cerebellum:       Appears normal         Abdomen:          Appears normal
 Posterior Fossa:  Appears normal         Abdominal Wall:   Appears nml (cord
                                                            insert, abd wall)
 Nuchal Fold:      Not applicable (>20    Cord Vessels:     Appears normal (3
                   wks GA)                                  vessel cord)
 Face:             Appears normal         Kidneys:          Appear normal
                   (orbits and profile)
 Lips:             Not well visualized    Bladder:          Appears normal
 Palate:           Not well visualized    Spine:            Appears normal
 Heart:            Appears normal         Lower             Appears normal
                   (4CH, axis, and        Extremities:
                   situs)
 RVOT:             Not well visualized    Upper             Appears normal
                                          Extremities:
 LVOT:             Appears normal
 Other:  Fetus appears to be a female. Heels and Lt 5th digit appear normal.
Targeted Anatomy
 Fetal Central Nervous System
 Cisterna Magna:
Cervix Uterus Adnexa
 Cervical Length:    4        cm
 Cervix:       Normal appearance by transabdominal scan. Appears
               closed, without funnelling.
 Left Ovary:    Within normal limits.
 Right Ovary:   Within normal limits.
Impression
INDICATION: 23 yr old [id number] at 13w7d for fetal anatomic
 survey. Finding of enlarged cisterna magna on outside
 ultrasound.

[Series 1: us ob detail+14 wk · 0.23mm/px · 12 of 96 slices shown]
[im 4/96]
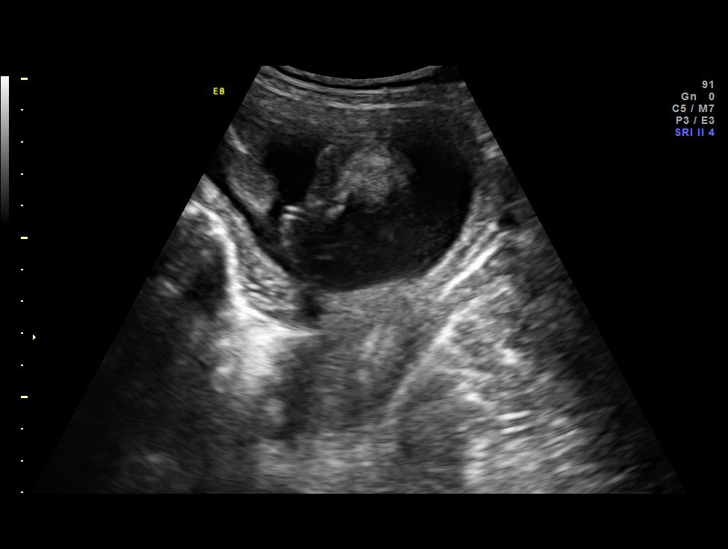
[im 11/96]
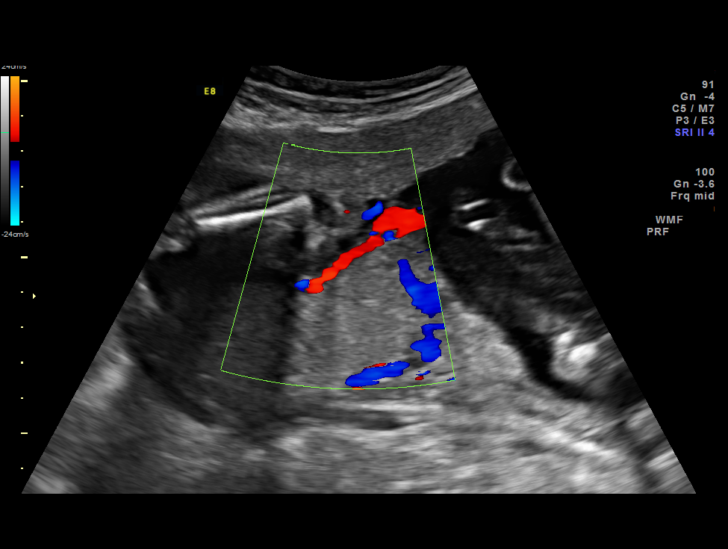
[im 18/96]
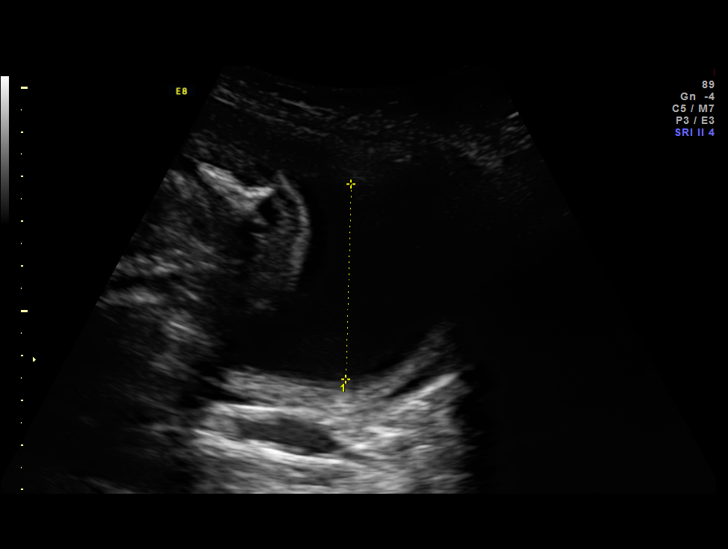
[im 29/96]
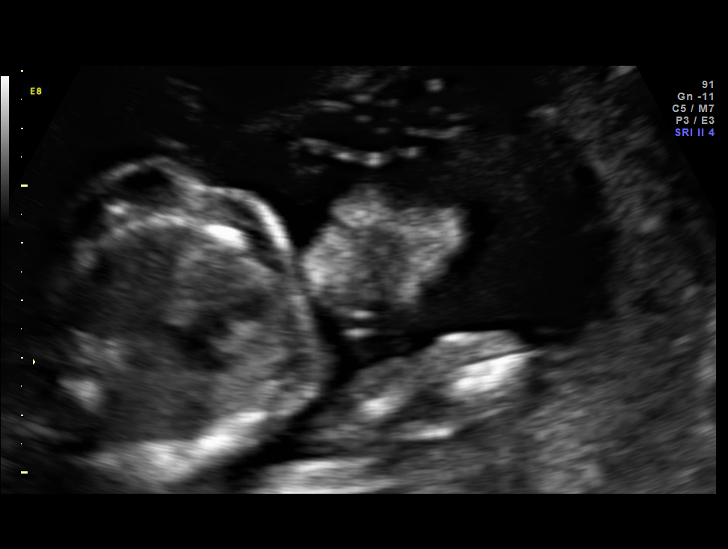
[im 36/96]
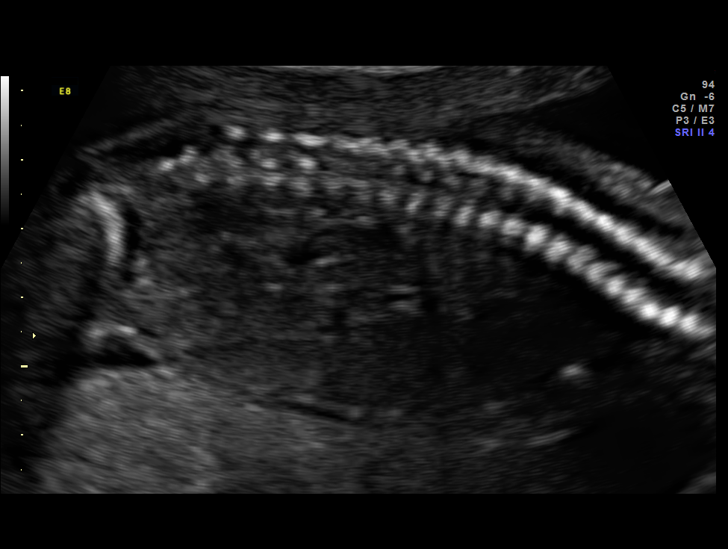
[im 43/96]
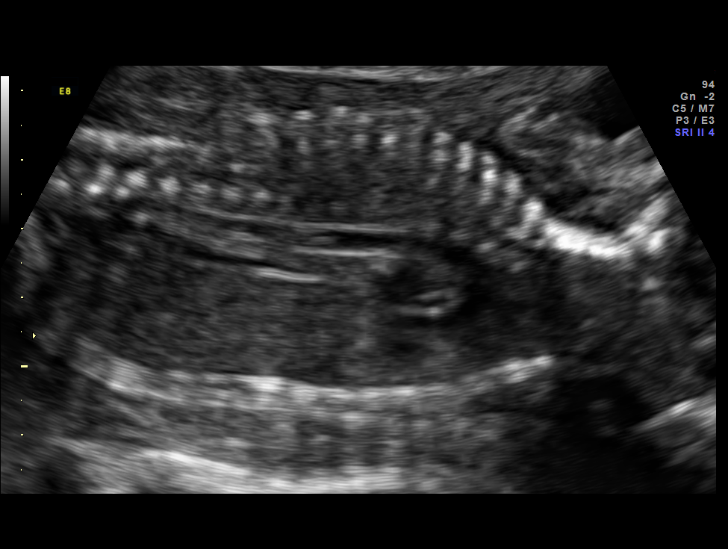
[im 53/96]
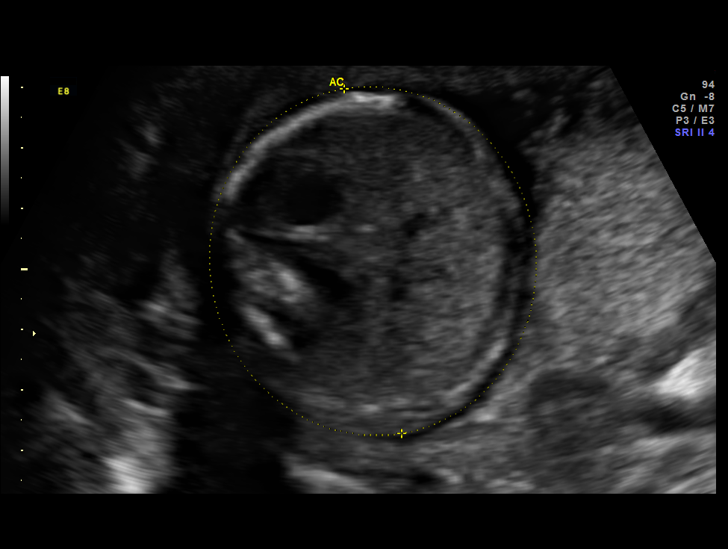
[im 60/96]
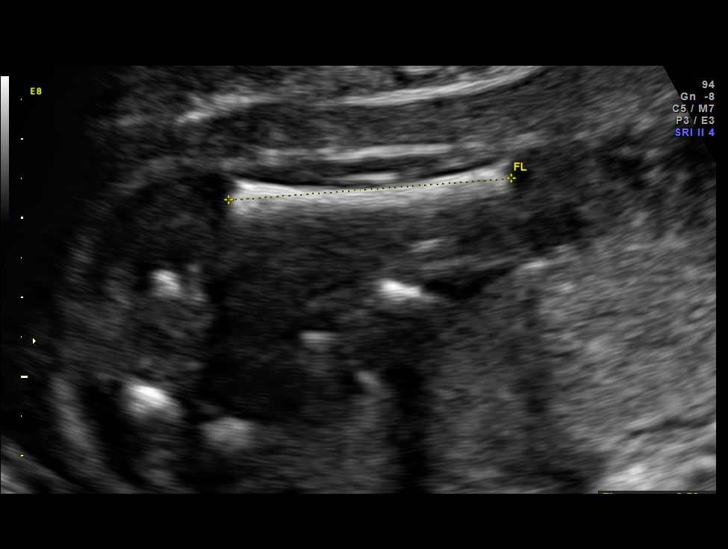
[im 67/96]
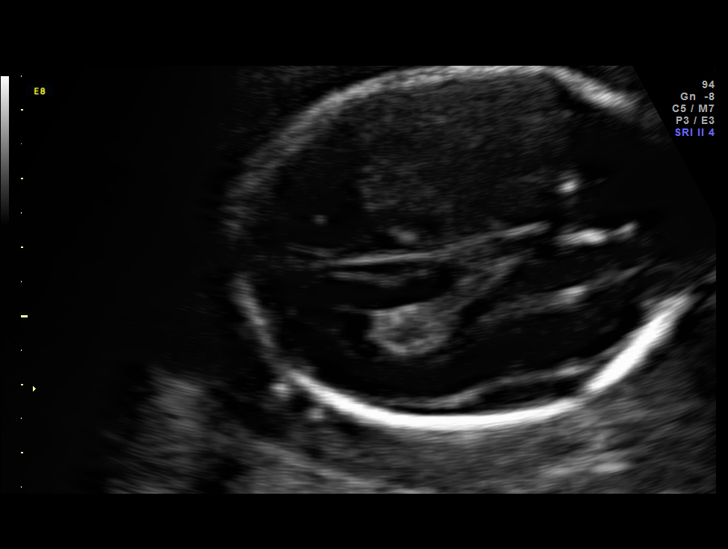
[im 78/96]
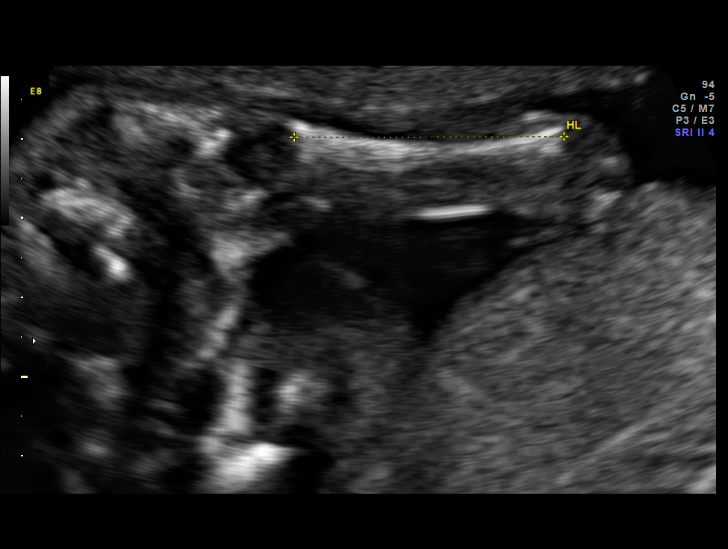
[im 85/96]
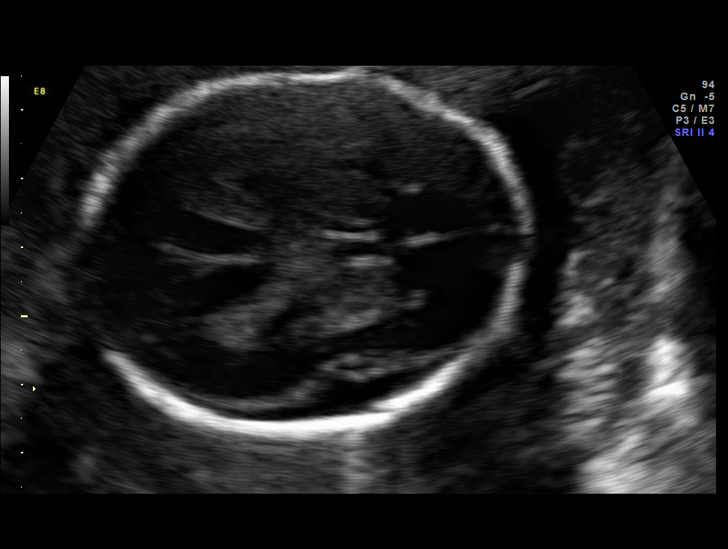
[im 92/96]
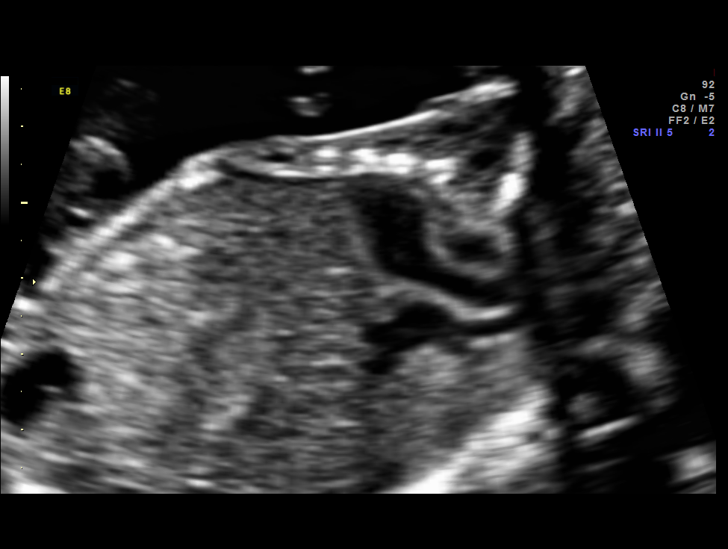

[12 of 28 positions shown; findings below may reference images not displayed]

FINDINGS: 1. Single intrauterine pregnancy.
 2. Fetal biometry is consistent with dating.
 3. Posterior placenta without evidence of previa.
 4. Normal amniotic fluid volume.
 5. Normal transabdominal cervical length.
 6. The views of the nose/lips, right outflow, and palate are
 limited.
 7. The remainder of the limited anatomy survey is normal.
 8. The cisterna magna appears normal on today's ultrasound.
Recommendations

 1. Appropriate fetal growth.
 2. Limited anatomy survey:
 - recommend follow up in 3-4 weeks to complete anatomic
 survey.
 3. Patient reports normal cell free fetal DNA- we do not have
 these results

## 2015-11-23 IMAGING — US US OB FOLLOW-UP
1 series · 12 of 28 positions shown · non-contrast
Comparison: none

[Series 1: us ob follow-up · 0.23mm/px · 12 of 61 slices shown]
[im 3/61]
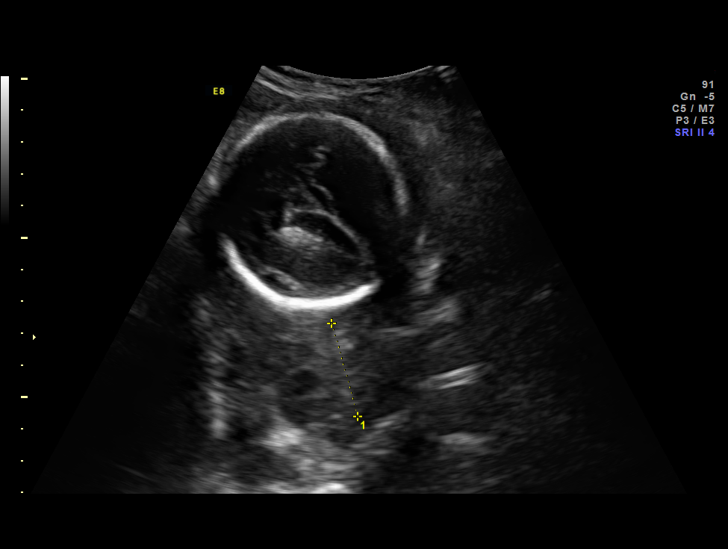
[im 7/61]
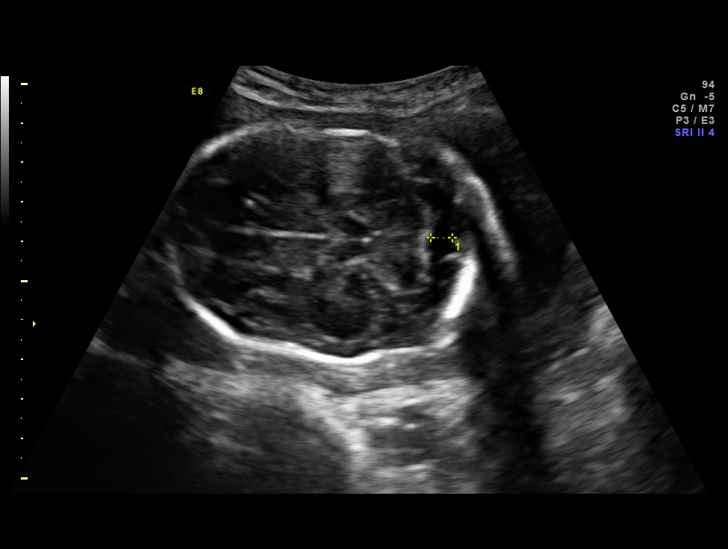
[im 12/61]
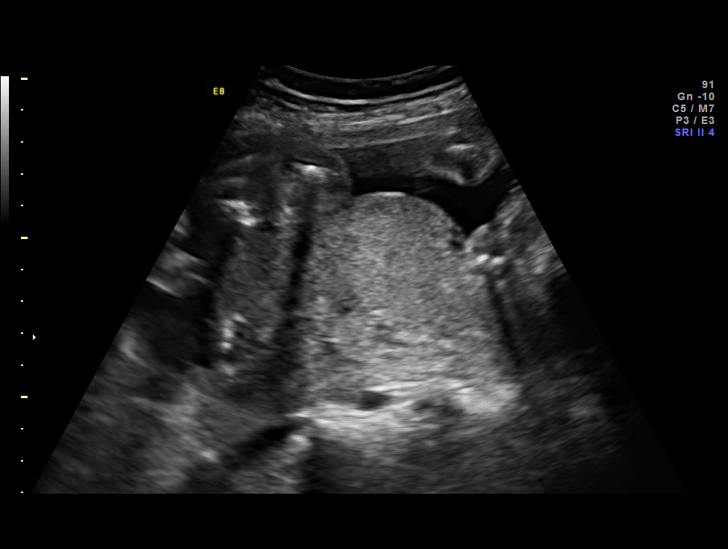
[im 18/61]
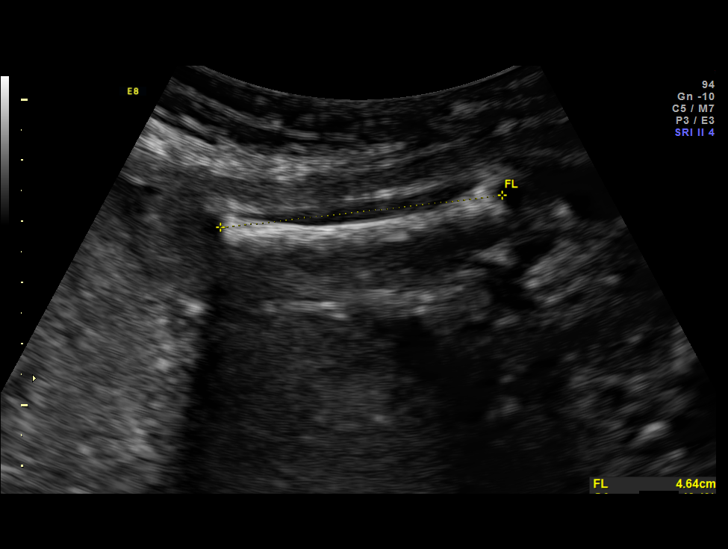
[im 23/61]
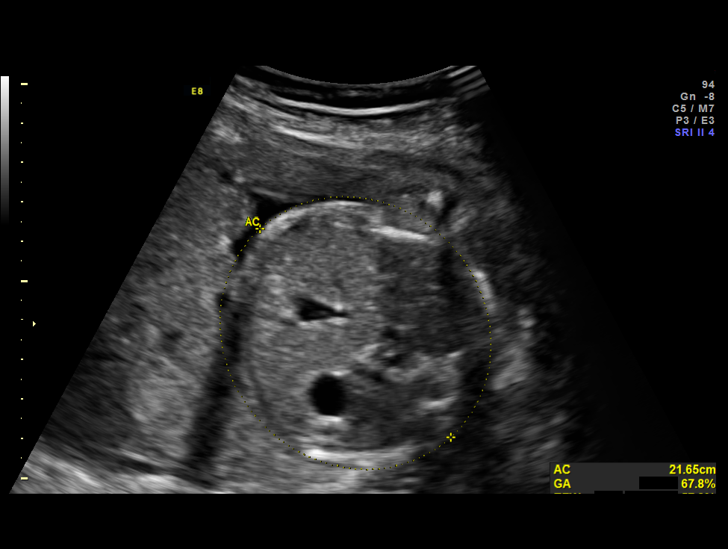
[im 27/61]
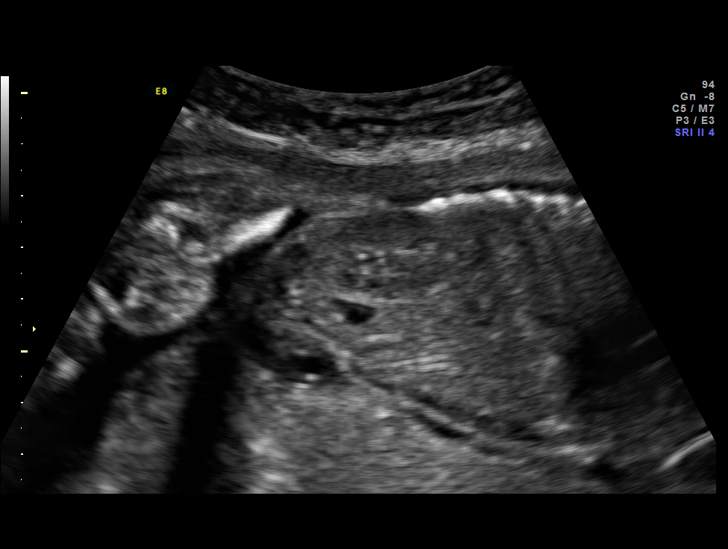
[im 34/61]
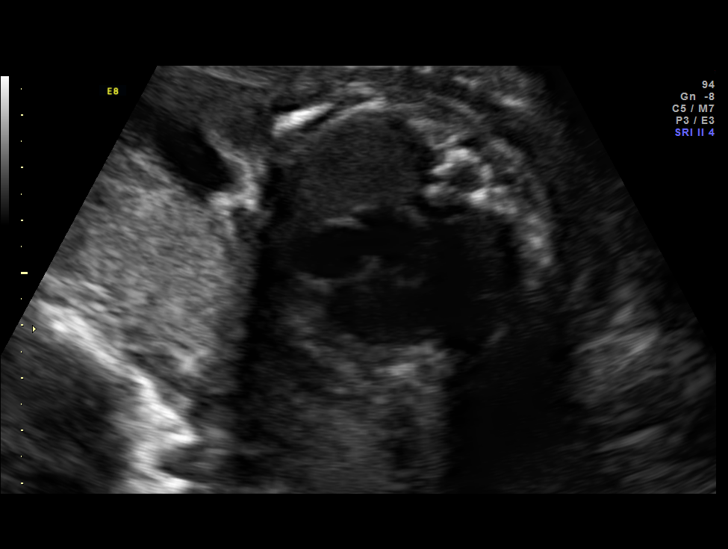
[im 38/61]
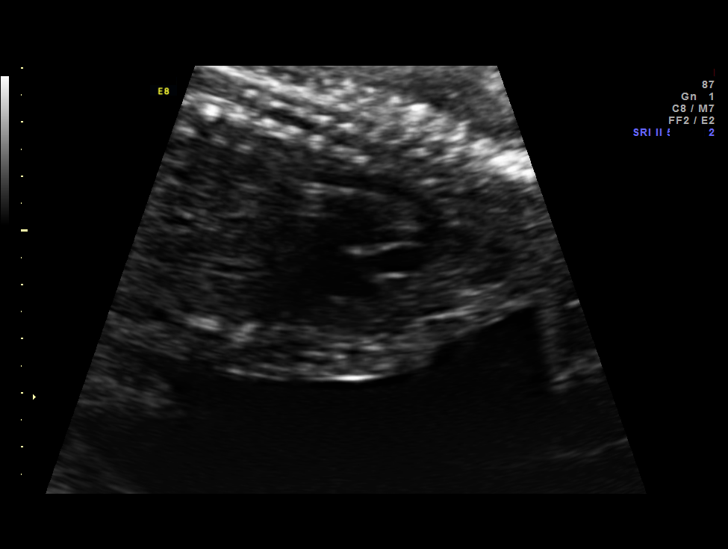
[im 43/61]
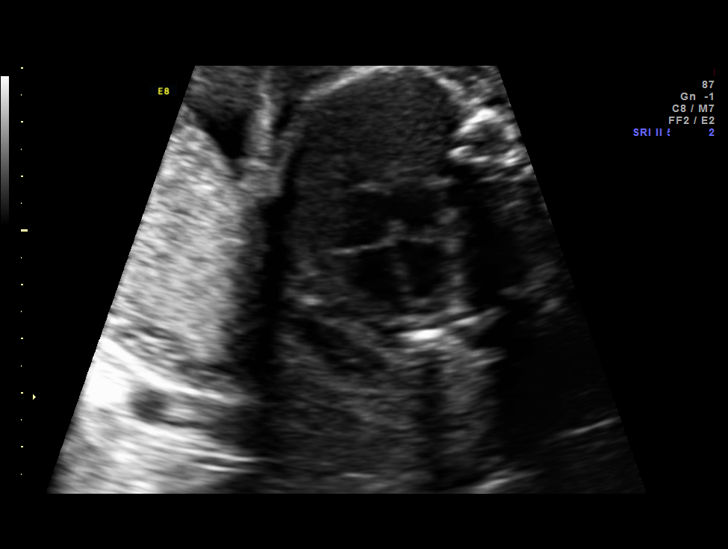
[im 49/61]
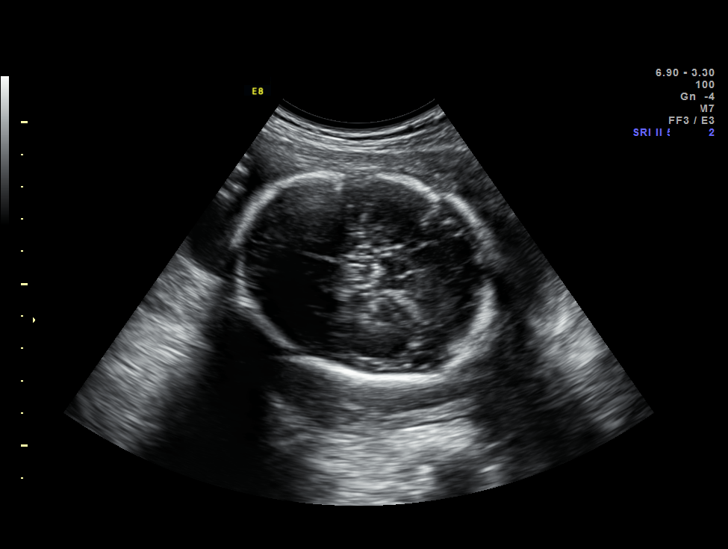
[im 54/61]
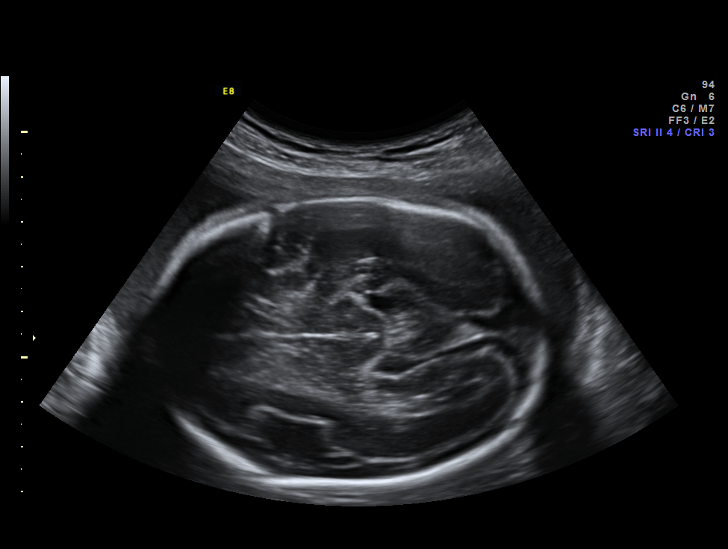
[im 58/61]
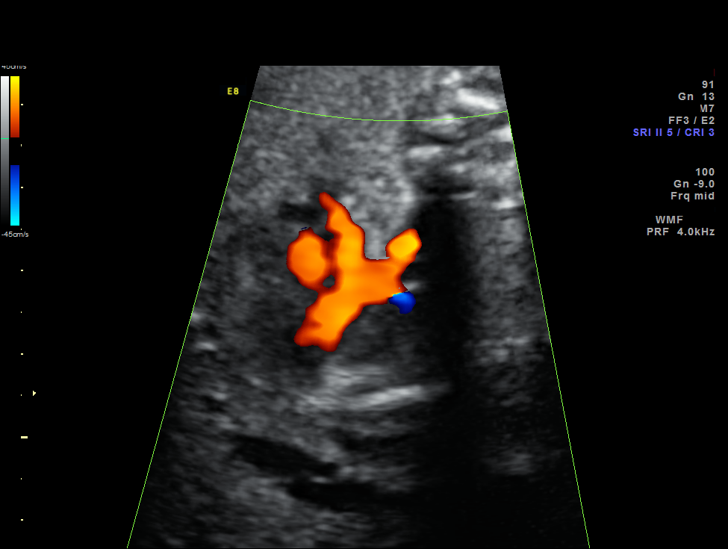

[12 of 28 positions shown; findings below may reference images not displayed]

OBSTETRICS REPORT
                      (Signed Final 08/01/2013 [DATE])

Service(s) Provided

 US OB FOLLOW UP                                       76816.1
Indications

 Fetal abnormality - other known or suspected
 (enlarged CM )
 Follow-up incomplete fetal anatomic evaluation
Fetal Evaluation

 Num Of Fetuses:    1
 Fetal Heart Rate:  135                          bpm
 Cardiac Activity:  Observed
 Presentation:      Cephalic
 Placenta:          Posterior, above cervical
                    os
 P. Cord            Previously Visualized
 Insertion:

 Amniotic Fluid
 AFI FV:      Subjectively within normal limits
                                             Larg Pckt:     5.1  cm
Biometry

 BPD:     61.7  mm     G. Age:  25w 1d                CI:         70.1   70 - 86
 OFD:       88  mm                                    FL/HC:      18.9   18.7 -

 HC:     241.2  mm     G. Age:  26w 2d       60  %    HC/AC:      1.11   1.04 -

 AC:     216.4  mm     G. Age:  26w 1d       67  %    FL/BPD:     73.9   71 - 87
 FL:      45.6  mm     G. Age:  25w 1d       31  %    FL/AC:      21.1   20 - 24
 HUM:     42.9  mm     G. Age:  25w 5d       54  %

 Est. FW:     842  gm    1 lb 14 oz      61  %
Gestational Age

 LMP:           25w 2d        Date:  02/05/13                 EDD:   11/12/13
 U/S Today:     25w 5d                                        EDD:   11/09/13
 Best:          25w 2d     Det. By:  LMP  (02/05/13)          EDD:   11/12/13
Anatomy
 Cranium:          Previously seen        Aortic Arch:      Appears normal
 Fetal Cavum:      Previously seen        Ductal Arch:      Previously seen
 Ventricles:       Appears normal         Diaphragm:        Appears normal
 Choroid Plexus:   Previously seen        Stomach:          Appears normal, left
                                                            sided
 Cerebellum:       Appears normal         Abdomen:          Appears normal
 Posterior Fossa:  Appears normal         Abdominal Wall:   Appears nml (cord
                                                            insert, abd wall)
 Nuchal Fold:      Not applicable (>20    Cord Vessels:     Previously seen
                   wks GA)
 Face:             Orbits and profile     Kidneys:          Appear normal
                   previously seen
 Lips:             Appears normal         Bladder:          Appears normal
 Heart:            Appears normal         Spine:            Previously seen
                   (4CH, axis, and
                   situs)
 RVOT:             Appears normal         Lower             Previously seen
                                          Extremities:
 LVOT:             Appears normal         Upper             Previously seen
                                          Extremities:

 Other:  Female gender. Heels and 5th digit previously seen.
Cervix Uterus Adnexa

 Cervical Length:    3        cm

 Cervix:       Normal appearance by transabdominal scan. Appears
               closed, without funnelling.
Impression

 SIUP at 25+2 weeks
 Normal interval anatomy; anatomic survey complete
 Normal amniotic fluid volume
 Appropriate interval growth with EFW at the 61st %tile
Recommendations

 Follow-up as clinically indicated

## 2016-08-10 ENCOUNTER — Ambulatory Visit
Admission: EM | Admit: 2016-08-10 | Discharge: 2016-08-10 | Disposition: A | Discharge status: Home / Self Care | Payer: Medicaid Other | Attending: Family Medicine | Admitting: Family Medicine

## 2016-08-10 ENCOUNTER — Encounter: Payer: Self-pay | Admitting: Emergency Medicine

## 2016-08-10 DIAGNOSIS — Z3202 Encounter for pregnancy test, result negative: Secondary | ICD-10-CM

## 2016-08-10 DIAGNOSIS — R21 Rash and other nonspecific skin eruption: Secondary | ICD-10-CM

## 2016-08-10 DIAGNOSIS — R110 Nausea: Secondary | ICD-10-CM

## 2016-08-10 LAB — PREGNANCY, URINE: Preg Test, Ur: NEGATIVE

## 2016-08-10 MED ORDER — DOXYCYCLINE HYCLATE 100 MG PO TABS: 100.0000 mg | ORAL_TABLET | Freq: Two times a day (BID) | ORAL | 0 refills | Status: DC

## 2016-08-10 MED ORDER — ONDANSETRON 8 MG PO TBDP: 8.0000 mg | ORAL_TABLET | Freq: Three times a day (TID) | ORAL | 0 refills | Status: DC | PRN

## 2016-08-10 NOTE — Discharge Instructions (Signed)
Follow-up if symptoms worsen

## 2016-08-10 NOTE — ED Provider Notes (Signed)
[facility]    CSN: [id number] Arrival date & time: 08/10/16  1505     History   Chief Complaint Chief Complaint  Patient presents with  . Rash  . Nausea    HPI Lauren Griffith is a 26 y.o. female.   26 yo female with a c/o 1 week h/o rash on her body. Patient states it started on her forearms and has since spread to her abdomen and back. Also 2 days of nausea and lightheadedness. Denies any fevers, chills, vomiting, diarrhea, abdominal pain, chest pains, shortness of breath, new soaps, detergents, plant contact or tick bites.    The history is provided by the patient.  Rash    Past Medical History:  Diagnosis Date  . Anemia   . Anxiety   . Asthma    as a child  . Urinary tract infection     Patient Active Problem List   Diagnosis Date Noted  . Normal delivery 10/23/2013  . Active labor 12/07/2010  . SVD (spontaneous vaginal delivery) 12/07/2010    Past Surgical History:  Procedure Laterality Date  . DILATION AND CURETTAGE OF UTERUS      OB History    Gravida Para Term Preterm AB Living   4 3 3   1 3    SAB TAB Ectopic Multiple Live Births     1     3       Home Medications    Prior to Admission medications   Medication Sig Start Date End Date Taking? Authorizing Provider  acetaminophen (TYLENOL) 500 MG tablet Take 1,000 mg by mouth every 6 (six) hours as needed for moderate pain or headache.     [provider]  doxycycline (VIBRA-TABS) 100 MG tablet Take 1 tablet (100 mg total) by mouth 2 (two) times daily. 08/10/16   Lauren Mccallum, MD  ondansetron (ZOFRAN ODT) 8 MG disintegrating tablet Take 1 tablet (8 mg total) by mouth every 8 (eight) hours as needed. 08/10/16   Lauren Mccallum, MD    Family History History reviewed. No pertinent family history.  Social History Social History  Substance Use Topics  . Smoking status: Never Smoker  . Smokeless tobacco: Never Used  . Alcohol use No     Allergies   Amoxicillin and  Cinnamon   Review of Systems Review of Systems  Skin: Positive for rash.     Physical Exam Triage Vital Signs ED Triage Vitals  Enc Vitals Group     BP 08/10/16 1539 105/79     Pulse Rate 08/10/16 1539 79     Resp 08/10/16 1539 16     Temp 08/10/16 1539 98.4 F (36.9 C)     Temp Source 08/10/16 1539 Oral     SpO2 08/10/16 1539 99 %     Weight 08/10/16 1536 130 lb (59 kg)     Height 08/10/16 1536 5\' 3"  (1.6 m)     Head Circumference --      Peak Flow --      Pain Score 08/10/16 1537 0     Pain Loc --      Pain Edu? --      Excl. in GC? --    No data found.   Updated Vital Signs BP 105/79 (BP Location: Left Arm)   Pulse 79   Temp 98.4 F (36.9 C) (Oral)   Resp 16   Ht 5\' 3"  (1.6 m)   Wt 130 lb (59 kg)   LMP 07/15/2016 (  Exact Date)   SpO2 99%   Breastfeeding? No   BMI 23.03 kg/m   Visual Acuity Right Eye Distance:   Left Eye Distance:   Bilateral Distance:    Right Eye Near:   Left Eye Near:    Bilateral Near:     Physical Exam  Constitutional: She appears well-developed and well-nourished. No distress.  Skin: Rash noted. Rash is papular and maculopapular. She is not diaphoretic.     Nursing note and vitals reviewed.    UC Treatments / Results  Labs (all labs ordered are listed, but only abnormal results are displayed) Labs Reviewed  PREGNANCY, URINE  B. BURGDORFI ANTIBODIES  ROCKY MTN SPOTTED FVR ABS PNL(IGG+IGM)    EKG  EKG Interpretation None       Radiology No results found.  Procedures Procedures (including critical care time)  Medications Ordered in UC Medications - No data to display   Initial Impression / Assessment and Plan / UC Course  I have reviewed the triage vital signs and the nursing notes.  Pertinent labs & imaging results that were available during my care of the patient were reviewed by me and considered in my medical decision making (see chart for details).       Final Clinical Impressions(s) / UC  Diagnoses   Final diagnoses:  Rash  Nausea    New Prescriptions Discharge Medication List as of 08/10/2016  4:20 PM    START taking these medications   Details  doxycycline (VIBRA-TABS) 100 MG tablet Take 1 tablet (100 mg total) by mouth 2 (two) times daily., Starting Sun 08/10/2016, Normal    ondansetron (ZOFRAN ODT) 8 MG disintegrating tablet Take 1 tablet (8 mg total) by mouth every 8 (eight) hours as needed., Starting Sun 08/10/2016, Normal       1. diagnosis reviewed with patient 2. rx as per orders above; reviewed possible side effects, interactions, risks and benefits; empiric treatment  3. Recommend checking labs for Lyme and RMSF as per orders 4. Follow-up prn if symptoms worsen or don't improve   Lauren Griffith, Lauren Kaney, MD 08/10/16 1640

## 2016-08-10 NOTE — ED Triage Notes (Signed)
Patient c/o itchy rash all over her body for a week. Patient reports nausea that started Friday.

## 2016-08-13 LAB — B. BURGDORFI ANTIBODIES: B burgdorferi Ab IgG+IgM: <0.91 {ISR} (ref 0.00–0.90)

## 2016-08-14 LAB — ROCKY MTN SPOTTED FVR ABS PNL(IGG+IGM)
RMSF IGM: 0.52 {index} (ref 0.00–0.89)
RMSF IgG: NEGATIVE

## 2016-08-22 ENCOUNTER — Telehealth: Payer: Self-pay | Admitting: *Deleted

## 2016-08-22 MED ORDER — FLUCONAZOLE 150 MG PO TABS: 150.0000 mg | ORAL_TABLET | Freq: Every day | ORAL | 0 refills | Status: DC

## 2016-10-10 DIAGNOSIS — R5383 Other fatigue: Secondary | ICD-10-CM | POA: Diagnosis not present

## 2016-10-10 DIAGNOSIS — G4700 Insomnia, unspecified: Secondary | ICD-10-CM | POA: Diagnosis not present

## 2016-10-10 DIAGNOSIS — E559 Vitamin D deficiency, unspecified: Secondary | ICD-10-CM | POA: Diagnosis not present

## 2016-10-10 DIAGNOSIS — F419 Anxiety disorder, unspecified: Secondary | ICD-10-CM | POA: Diagnosis not present

## 2016-10-10 DIAGNOSIS — E7800 Pure hypercholesterolemia, unspecified: Secondary | ICD-10-CM | POA: Diagnosis not present

## 2016-10-10 DIAGNOSIS — F339 Major depressive disorder, recurrent, unspecified: Secondary | ICD-10-CM | POA: Diagnosis not present

## 2016-11-07 ENCOUNTER — Telehealth: Payer: Self-pay

## 2016-11-07 NOTE — Telephone Encounter (Signed)
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 Pt wants to know if could get med called in to [facility] until could make appt next week. Pt request cb. Pt has New pt appt on 11/24/16 with Pamala Hurry NP.Please advise.

## 2016-11-07 NOTE — Telephone Encounter (Signed)
Pt is aware that she will need to schedule an appt, an acute visit separate from her est care on 11/24/16... Pt is aware to continue OTC treatment until appt Mon at 12:30pm

## 2016-11-07 NOTE — Telephone Encounter (Signed)
This pt has not even established care here. She will need to treat with OTC Monistat

## 2016-11-10 ENCOUNTER — Ambulatory Visit: Payer: Self-pay | Admitting: Internal Medicine

## 2016-11-10 NOTE — Progress Notes (Deleted)
   Subjective:    Patient ID: Lauren Griffith, female    DOB: 1990-08-19, 26 y.o.   MRN: [id number]  HPI  Pt presents to the clinic today with c/o vaginal discharge and itching.   Review of Systems      Past Medical History:  Diagnosis Date  . Anemia   . Anxiety   . Asthma    as a child  . Urinary tract infection     Current Outpatient Prescriptions  Medication Sig Dispense Refill  . acetaminophen (TYLENOL) 500 MG tablet Take 1,000 mg by mouth every 6 (six) hours as needed for moderate pain or headache.     . doxycycline (VIBRA-TABS) 100 MG tablet Take 1 tablet (100 mg total) by mouth 2 (two) times daily. 28 tablet 0  . fluconazole (DIFLUCAN) 150 MG tablet Take 1 tablet (150 mg total) by mouth daily. 1 tablet 0  . ondansetron (ZOFRAN ODT) 8 MG disintegrating tablet Take 1 tablet (8 mg total) by mouth every 8 (eight) hours as needed. 6 tablet 0   No current facility-administered medications for this visit.     Allergies  Allergen Reactions  . Amoxicillin Nausea And Vomiting  . Cinnamon Swelling and Other (See Comments)    Mouth swelling. Pt states it is a minor allergy     No family history on file.  Social History   Social History  . Marital status: Married    Spouse name: N/A  . Number of children: N/A  . Years of education: N/A   Occupational History  . Not on file.   Social History Main Topics  . Smoking status: Never Smoker  . Smokeless tobacco: Never Used  . Alcohol use No  . Drug use: No  . Sexual activity: Yes     Comment: last sex 2-3 weeks ago   Other Topics Concern  . Not on file   Social History Narrative  . No narrative on file     Constitutional: Denies fever, malaise, fatigue, headache or abrupt weight changes.  HEENT: Denies eye pain, eye redness, ear pain, ringing in the ears, wax buildup, runny nose, nasal congestion, bloody nose, or sore throat. Respiratory: Denies difficulty breathing, shortness of breath, cough or sputum  production.   Cardiovascular: Denies chest pain, chest tightness, palpitations or swelling in the hands or feet.  Gastrointestinal: Denies abdominal pain, bloating, constipation, diarrhea or blood in the stool.  GU: Denies urgency, frequency, pain with urination, burning sensation, blood in urine, odor or discharge. Musculoskeletal: Denies decrease in range of motion, difficulty with gait, muscle pain or joint pain and swelling.  Skin: Denies redness, rashes, lesions or ulcercations.  Neurological: Denies dizziness, difficulty with memory, difficulty with speech or problems with balance and coordination.  Psych: Denies anxiety, depression, SI/HI.  No other specific complaints in a complete review of systems (except as listed in HPI above).  Objective:   Physical Exam        Assessment & Plan:

## 2016-11-24 ENCOUNTER — Ambulatory Visit: Payer: Self-pay | Admitting: Internal Medicine

## 2016-11-26 ENCOUNTER — Ambulatory Visit: Payer: Self-pay | Admitting: Internal Medicine

## 2016-11-26 DIAGNOSIS — Z0289 Encounter for other administrative examinations: Secondary | ICD-10-CM

## 2016-12-04 ENCOUNTER — Emergency Department: Payer: BLUE CROSS/BLUE SHIELD

## 2016-12-04 ENCOUNTER — Ambulatory Visit (INDEPENDENT_AMBULATORY_CARE_PROVIDER_SITE_OTHER): Payer: BLUE CROSS/BLUE SHIELD | Admitting: Internal Medicine

## 2016-12-04 ENCOUNTER — Emergency Department
Admission: EM | Admit: 2016-12-04 | Discharge: 2016-12-04 | Disposition: A | Discharge status: Home / Self Care | Payer: BLUE CROSS/BLUE SHIELD | Attending: Student in an Organized Health Care Education/Training Program | Admitting: Student in an Organized Health Care Education/Training Program

## 2016-12-04 ENCOUNTER — Encounter: Payer: Self-pay | Admitting: Internal Medicine

## 2016-12-04 ENCOUNTER — Encounter: Payer: Self-pay | Admitting: Emergency Medicine

## 2016-12-04 VITALS — BP 106/70 | HR 133 | Temp 102.1°F | Wt 120.0 lb

## 2016-12-04 DIAGNOSIS — N10 Acute tubulo-interstitial nephritis: Secondary | ICD-10-CM | POA: Diagnosis not present

## 2016-12-04 DIAGNOSIS — R509 Fever, unspecified: Secondary | ICD-10-CM | POA: Diagnosis present

## 2016-12-04 DIAGNOSIS — N12 Tubulo-interstitial nephritis, not specified as acute or chronic: Secondary | ICD-10-CM | POA: Diagnosis not present

## 2016-12-04 DIAGNOSIS — R109 Unspecified abdominal pain: Secondary | ICD-10-CM

## 2016-12-04 DIAGNOSIS — J45909 Unspecified asthma, uncomplicated: Secondary | ICD-10-CM | POA: Diagnosis not present

## 2016-12-04 DIAGNOSIS — R1031 Right lower quadrant pain: Secondary | ICD-10-CM | POA: Diagnosis not present

## 2016-12-04 DIAGNOSIS — Z79899 Other long term (current) drug therapy: Secondary | ICD-10-CM | POA: Diagnosis not present

## 2016-12-04 DIAGNOSIS — J449 Chronic obstructive pulmonary disease, unspecified: Secondary | ICD-10-CM | POA: Diagnosis not present

## 2016-12-04 LAB — URINALYSIS, COMPLETE (UACMP) WITH MICROSCOPIC
Bilirubin Urine: NEGATIVE
GLUCOSE, UA: NEGATIVE mg/dL
KETONES UR: NEGATIVE mg/dL
NITRITE: POSITIVE — AB
PROTEIN: NEGATIVE mg/dL
Specific Gravity, Urine: 1.006 (ref 1.005–1.030)
pH: 6 (ref 5.0–8.0)

## 2016-12-04 LAB — POC URINALSYSI DIPSTICK (AUTOMATED)
Bilirubin, UA: NEGATIVE
Glucose, UA: NEGATIVE
KETONES UA: NEGATIVE
NITRITE UA: POSITIVE
PH UA: 7.5 (ref 5.0–8.0)
PROTEIN UA: NEGATIVE
Spec Grav, UA: 1.025 (ref 1.010–1.025)
Urobilinogen, UA: 0.2 E.U./dL

## 2016-12-04 LAB — COMPREHENSIVE METABOLIC PANEL
ALT: 11 U/L — ABNORMAL LOW (ref 14–54)
ANION GAP: 9 (ref 5–15)
AST: 23 U/L (ref 15–41)
Albumin: 4.3 g/dL (ref 3.5–5.0)
Alkaline Phosphatase: 71 U/L (ref 38–126)
BILIRUBIN TOTAL: 0.6 mg/dL (ref 0.3–1.2)
BUN: 12 mg/dL (ref 6–20)
CO2: 22 mmol/L (ref 22–32)
Calcium: 9.2 mg/dL (ref 8.9–10.3)
Chloride: 102 mmol/L (ref 101–111)
Creatinine, Ser: 0.87 mg/dL (ref 0.44–1.00)
GFR calc non Af Amer: >60 mL/min (ref >60)
GLUCOSE: 127 mg/dL — AB (ref 65–99)
Potassium: 3.4 mmol/L — ABNORMAL LOW (ref 3.5–5.1)
SODIUM: 133 mmol/L — AB (ref 135–145)
TOTAL PROTEIN: 8.4 g/dL — AB (ref 6.5–8.1)

## 2016-12-04 LAB — POCT PREGNANCY, URINE: Preg Test, Ur: NEGATIVE

## 2016-12-04 LAB — CBC WITH DIFFERENTIAL/PLATELET
BASOS ABS: 0 10*3/uL (ref 0–0.1)
Basophils Relative: 0 %
Eosinophils Absolute: 0 10*3/uL (ref 0–0.7)
Eosinophils Relative: 0 %
HEMATOCRIT: 36.4 % (ref 35.0–47.0)
Hemoglobin: 12.3 g/dL (ref 12.0–16.0)
LYMPHS PCT: 5 %
Lymphs Abs: 0.6 10*3/uL — ABNORMAL LOW (ref 1.0–3.6)
MCH: 27.9 pg (ref 26.0–34.0)
MCHC: 33.7 g/dL (ref 32.0–36.0)
MCV: 82.8 fL (ref 80.0–100.0)
MONO ABS: 1 10*3/uL — AB (ref 0.2–0.9)
Monocytes Relative: 8 %
NEUTROS ABS: 10.4 10*3/uL — AB (ref 1.4–6.5)
NEUTROS PCT: 87 %
Platelets: 271 10*3/uL (ref 150–440)
RBC: 4.4 MIL/uL (ref 3.80–5.20)
RDW: 13.5 % (ref 11.5–14.5)
WBC: 12 10*3/uL — AB (ref 3.6–11.0)

## 2016-12-04 LAB — HCG, QUANTITATIVE, PREGNANCY: hCG, Beta Chain, Quant, S: <1 m[IU]/mL (ref <5)

## 2016-12-04 LAB — LACTIC ACID, PLASMA: Lactic Acid, Venous: 1.6 mmol/L (ref 0.5–1.9)

## 2016-12-04 MED ORDER — TRAMADOL HCL 50 MG PO TABS: 50.0000 mg | ORAL_TABLET | Freq: Four times a day (QID) | ORAL | 0 refills | Status: DC | PRN

## 2016-12-04 MED ORDER — OXYCODONE HCL 5 MG PO TABS
ORAL_TABLET | ORAL | Status: AC
  Filled 2016-12-04: qty 1

## 2016-12-04 MED ORDER — SODIUM CHLORIDE 0.9 % IV BOLUS (SEPSIS)
1000.0000 mL | Freq: Once | INTRAVENOUS | Status: AC
  Administered 2016-12-04: 1000 mL via INTRAVENOUS

## 2016-12-04 MED ORDER — ONDANSETRON 4 MG PO TBDP: 4.0000 mg | ORAL_TABLET | Freq: Three times a day (TID) | ORAL | 0 refills | Status: DC | PRN

## 2016-12-04 MED ORDER — ACETAMINOPHEN 325 MG PO TABS
ORAL_TABLET | ORAL | Status: AC
  Administered 2016-12-04: 650 mg via ORAL
  Filled 2016-12-04: qty 2

## 2016-12-04 MED ORDER — ACETAMINOPHEN 325 MG PO TABS
650.0000 mg | ORAL_TABLET | Freq: Once | ORAL | Status: AC | PRN
  Administered 2016-12-04: 650 mg via ORAL

## 2016-12-04 MED ORDER — OXYCODONE HCL 5 MG PO TABS
5.0000 mg | ORAL_TABLET | Freq: Once | ORAL | Status: AC
  Administered 2016-12-04: 5 mg via ORAL

## 2016-12-04 MED ORDER — CEPHALEXIN 500 MG PO CAPS: 500.0000 mg | ORAL_CAPSULE | Freq: Three times a day (TID) | ORAL | 0 refills | Status: AC

## 2016-12-04 MED ORDER — IBUPROFEN 600 MG PO TABS
600.0000 mg | ORAL_TABLET | Freq: Once | ORAL | Status: AC
  Administered 2016-12-04: 600 mg via ORAL

## 2016-12-04 MED ORDER — IOPAMIDOL (ISOVUE-300) INJECTION 61%
100.0000 mL | Freq: Once | INTRAVENOUS | Status: AC | PRN
  Administered 2016-12-04: 100 mL via INTRAVENOUS

## 2016-12-04 MED ORDER — CEFTRIAXONE SODIUM IN DEXTROSE 20 MG/ML IV SOLN
1.0000 g | Freq: Once | INTRAVENOUS | Status: AC
  Administered 2016-12-04: 1 g via INTRAVENOUS
  Filled 2016-12-04: qty 50

## 2016-12-04 MED ORDER — IBUPROFEN 600 MG PO TABS
ORAL_TABLET | ORAL | Status: AC
  Filled 2016-12-04: qty 1

## 2016-12-04 NOTE — ED Notes (Signed)
Per Dr Roxan Hockey - when pt heart rate maintains below 100 she can be discharged - pt is eating chicken wings and drinking sprite at this time

## 2016-12-04 NOTE — Patient Instructions (Signed)
Pyelonephritis, Adult Pyelonephritis is a kidney infection. The kidneys are organs that help clean your blood by moving waste out of your blood and into your pee (urine). This infection can happen quickly, or it can last for a long time. In most cases, it clears up with treatment and does not cause other problems. Follow these instructions at home: Medicines  Take over-the-counter and prescription medicines only as told by your doctor.  Take your antibiotic medicine as told by your doctor. Do not stop taking the medicine even if you start to feel better. General instructions  Drink enough fluid to keep your pee clear or pale yellow.  Avoid caffeine, tea, and carbonated drinks.  Pee (urinate) often. Avoid holding in pee for long periods of time.  Pee before and after sex.  After pooping (having a bowel movement), women should wipe from front to back. Use each tissue only once.  Keep all follow-up visits as told by your doctor. This is important. Contact a doctor if:  You do not feel better after 2 days.  Your symptoms get worse.  You have a fever. Get help right away if:  You cannot take your medicine or drink fluids as told.  You have chills and shaking.  You throw up (vomit).  You have very bad pain in your side (flank) or back.  You feel very weak or you pass out (faint). This information is not intended to replace advice given to you by your health care provider. Make sure you discuss any questions you have with your health care provider. Document Released: 04/10/2004 Document Revised: 08/09/2015 Document Reviewed: 06/26/2014 Elsevier Interactive Patient Education  2018 [employer]  

## 2016-12-04 NOTE — Progress Notes (Signed)
Subjective:    Patient ID: Lauren Griffith, female    DOB: 15-May-1990, 26 y.o.   MRN: [id number]  HPI  Pt presents to the clinic today with c/o fever, chills and right flank pain. She reports this started this morning. She has run a fever up to 103. She denies urinary urgency, frequency or dysuria. She has taken Ibuprofen with minimal relief.   Review of Systems      Past Medical History:  Diagnosis Date  . Anemia   . Anxiety   . Asthma    as a child  . Urinary tract infection     Current Outpatient Prescriptions  Medication Sig Dispense Refill  . FLUoxetine (PROZAC) 40 MG capsule Take 40 mg by mouth daily.     No current facility-administered medications for this visit.     Allergies  Allergen Reactions  . Amoxicillin Nausea And Vomiting  . Cinnamon Swelling and Other (See Comments)    Mouth swelling. Pt states it is a minor allergy     No family history on file.  Social History   Social History  . Marital status: Married    Spouse name: N/A  . Number of children: N/A  . Years of education: N/A   Occupational History  . Not on file.   Social History Main Topics  . Smoking status: Never Smoker  . Smokeless tobacco: Never Used  . Alcohol use No  . Drug use: No  . Sexual activity: Yes     Comment: last sex 2-3 weeks ago   Other Topics Concern  . Not on file   Social History Narrative  . No narrative on file     Constitutional: Pt reports fever and chills. Denies malaise, fatigue, headache or abrupt weight changes.  Respiratory: Denies difficulty breathing, shortness of breath, cough or sputum production.   Cardiovascular: Denies chest pain, chest tightness, palpitations or swelling in the hands or feet.  Gastrointestinal: Pt reports flank pain. Denies abdominal pain, bloating, constipation, diarrhea or blood in the stool.  GU: Denies urgency, frequency, pain with urination, burning sensation, blood in urine, odor or discharge.  No other specific  complaints in a complete review of systems (except as listed in HPI above).  Objective:   Physical Exam   BP 106/70   Pulse (!) 133   Temp (!) 102.1 F (38.9 C) (Oral)   Wt 120 lb (54.4 kg)   LMP 11/16/2016   SpO2 98%   BMI 21.26 kg/m  Wt Readings from Last 3 Encounters:  12/04/16 120 lb (54.4 kg)  08/10/16 130 lb (59 kg)  10/23/13 140 lb (63.5 kg)    General: Appears her stated age, will appearing, in NAD. Skin: Dry and intact, but she is shivering. Cardiovascular: Tachycardic with normal rhythm. S1,S2 noted.  No murmur, rubs or gallops noted.  Pulmonary/Chest: Normal effort and positive vesicular breath sounds. No respiratory distress. No wheezes, rales or ronchi noted.  Abdomen: Soft and nontender. Normal bowel sounds. CVA tenderness noted on the right..  Neurological: Alert and oriented.    BMET    Component Value Date/Time   NA 134 (L) 08/26/2013 0205   K 3.6 (L) 08/26/2013 0205   CL 101 08/26/2013 0205   CO2 23 08/26/2013 0205   GLUCOSE 98 08/26/2013 0205   BUN 9 08/26/2013 0205   CREATININE 0.45 (L) 08/26/2013 0205   CALCIUM 8.8 08/26/2013 0205   GFRNONAA >90 08/26/2013 0205   GFRAA >90 08/26/2013 0205  Lipid Panel  No results found for: CHOL, TRIG, HDL, CHOLHDL, VLDL, LDLCALC  CBC    Component Value Date/Time   WBC 14.6 (H) 10/24/2013 0730   RBC 3.64 (L) 10/24/2013 0730   HGB 7.2 (L) 10/24/2013 0730   HCT 25.2 (L) 10/24/2013 0730   PLT 267 10/24/2013 0730   MCV 69.2 (L) 10/24/2013 0730   MCH 19.8 (L) 10/24/2013 0730   MCHC 28.6 (L) 10/24/2013 0730   RDW 19.4 (H) 10/24/2013 0730    Hgb A1C No results found for: HGBA1C         Assessment & Plan:   Acute Pyelonephritis:  Urinalysis: 2+ leuks, pos nitrites and pos blood Will send urine culture She is on the verge of urosepsis, advised her grandmother to transport her to the ER right away, will likely need IV fluids and IV antibiotics  Will follow up after ER visit. Nicki Reaper,  NP

## 2016-12-04 NOTE — ED Notes (Signed)
Patient transported to CT 

## 2016-12-04 NOTE — ED Notes (Signed)
Pt was seen at PCP today and they were concerned to due her right flank pain and urinalysis that she had a kidney infection and advised her to come to the ED

## 2016-12-04 NOTE — ED Provider Notes (Signed)
[facility] Emergency Department Provider Note    First MD Initiated Contact with Patient 12/04/16 1621     (approximate)  I have reviewed the triage vital signs and the nursing notes.   HISTORY  Chief Complaint Fever and Flank Pain    HPI Lauren Griffith is a 26 y.o. female history of recurrent urinary tract infections presents with sudden onset right flank pain and dysuria with high fever started today. No nausea or vomiting. Has not been on any antibiotics recently. States the pain is primarily right flank and has some on the right lower quadrant.  Did take ibuprofen at 10 this morning. Has never had pain this severe. No history kidney stones.   Past Medical History:  Diagnosis Date  . Anemia   . Anxiety   . Asthma    as a child  . Urinary tract infection    No family history on file. Past Surgical History:  Procedure Laterality Date  . DILATION AND CURETTAGE OF UTERUS     There are no active problems to display for this patient.     Prior to Admission medications   Medication Sig Start Date End Date Taking? Authorizing Provider  cephALEXin (KEFLEX) 500 MG capsule Take 1 capsule (500 mg total) by mouth 3 (three) times daily. 12/04/16 12/11/16  Willy Eddy, MD  FLUoxetine (PROZAC) 40 MG capsule Take 40 mg by mouth daily.    [provider]  ondansetron (ZOFRAN ODT) 4 MG disintegrating tablet Take 1 tablet (4 mg total) by mouth every 8 (eight) hours as needed for nausea or vomiting. 12/04/16   Willy Eddy, MD  traMADol (ULTRAM) 50 MG tablet Take 1 tablet (50 mg total) by mouth every 6 (six) hours as needed. 12/04/16 12/04/17  Willy Eddy, MD    Allergies Amoxicillin and Cinnamon    Social History Social History  Substance Use Topics  . Smoking status: Never Smoker  . Smokeless tobacco: Never Used  . Alcohol use No    Review of Systems Patient denies headaches, rhinorrhea, blurry vision, numbness, shortness  of breath, chest pain, edema, cough, abdominal pain, nausea, vomiting, diarrhea, dysuria, fevers, rashes or hallucinations unless otherwise stated above in HPI. ____________________________________________   PHYSICAL EXAM:  VITAL SIGNS: Vitals:   12/04/16 1800 12/04/16 1830  BP: 114/81 112/71  Pulse: (!) 119 (!) 103  Resp:    Temp:    SpO2: 100% 99%    Constitutional: Alert and oriented. Well appearing and in no acute distress. Eyes: Conjunctivae are normal.  Head: Atraumatic. Nose: No congestion/rhinnorhea. Mouth/Throat: Mucous membranes are moist.   Neck: No stridor. Painless ROM.  Cardiovascular: Normal rate, regular rhythm. Grossly normal heart sounds.  Good peripheral circulation. Respiratory: Normal respiratory effort.  No retractions. Lungs CTAB. Gastrointestinal: Soft with mild ttp of rlq No distention. No abdominal bruits. + right CVA tenderness. Genitourinary:  Musculoskeletal: No lower extremity tenderness nor edema.  No joint effusions. Neurologic:  Normal speech and language. No gross focal neurologic deficits are appreciated. No facial droop Skin:  Skin is warm, dry and intact. No rash noted. Psychiatric: Mood and affect are normal. Speech and behavior are normal.  ____________________________________________   LABS (all labs ordered are listed, but only abnormal results are displayed)  Results for orders placed or performed during the hospital encounter of 12/04/16 (from the past 24 hour(s))  Lactic acid, plasma     Status: None   Collection Time: 12/04/16  4:12 PM  Result Value Ref Range  Lactic Acid, Venous 1.6 0.5 - 1.9 mmol/L  Comprehensive metabolic panel     Status: Abnormal   Collection Time: 12/04/16  4:12 PM  Result Value Ref Range   Sodium 133 (L) 135 - 145 mmol/L   Potassium 3.4 (L) 3.5 - 5.1 mmol/L   Chloride 102 101 - 111 mmol/L   CO2 22 22 - 32 mmol/L   Glucose, Bld 127 (H) 65 - 99 mg/dL   BUN 12 6 - 20 mg/dL   Creatinine, Ser 1.61 0.44  - 1.00 mg/dL   Calcium 9.2 8.9 - 09.6 mg/dL   Total Protein 8.4 (H) 6.5 - 8.1 g/dL   Albumin 4.3 3.5 - 5.0 g/dL   AST 23 15 - 41 U/L   ALT 11 (L) 14 - 54 U/L   Alkaline Phosphatase 71 38 - 126 U/L   Total Bilirubin 0.6 0.3 - 1.2 mg/dL   GFR calc non Af Amer >60 >60 mL/min   GFR calc Af Amer >60 >60 mL/min   Anion gap 9 5 - 15  CBC with Differential     Status: Abnormal   Collection Time: 12/04/16  4:12 PM  Result Value Ref Range   WBC 12.0 (H) 3.6 - 11.0 K/uL   RBC 4.40 3.80 - 5.20 MIL/uL   Hemoglobin 12.3 12.0 - 16.0 g/dL   HCT 04.5 40.9 - 81.1 %   MCV 82.8 80.0 - 100.0 fL   MCH 27.9 26.0 - 34.0 pg   MCHC 33.7 32.0 - 36.0 g/dL   RDW 91.4 78.2 - 95.6 %   Platelets 271 150 - 440 K/uL   Neutrophils Relative % 87 %   Neutro Abs 10.4 (H) 1.4 - 6.5 K/uL   Lymphocytes Relative 5 %   Lymphs Abs 0.6 (L) 1.0 - 3.6 K/uL   Monocytes Relative 8 %   Monocytes Absolute 1.0 (H) 0.2 - 0.9 K/uL   Eosinophils Relative 0 %   Eosinophils Absolute 0.0 0 - 0.7 K/uL   Basophils Relative 0 %   Basophils Absolute 0.0 0 - 0.1 K/uL  hCG, quantitative, pregnancy     Status: None   Collection Time: 12/04/16  4:12 PM  Result Value Ref Range   hCG, Beta Chain, Quant, S <1 <5 mIU/mL  Pregnancy, urine POC     Status: None   Collection Time: 12/04/16  5:29 PM  Result Value Ref Range   Preg Test, Ur NEGATIVE NEGATIVE  Urinalysis, Complete w Microscopic     Status: Abnormal   Collection Time: 12/04/16  5:30 PM  Result Value Ref Range   Color, Urine YELLOW (A) YELLOW   APPearance HAZY (A) CLEAR   Specific Gravity, Urine 1.006 1.005 - 1.030   pH 6.0 5.0 - 8.0   Glucose, UA NEGATIVE NEGATIVE mg/dL   Hgb urine dipstick MODERATE (A) NEGATIVE   Bilirubin Urine NEGATIVE NEGATIVE   Ketones, ur NEGATIVE NEGATIVE mg/dL   Protein, ur NEGATIVE NEGATIVE mg/dL   Nitrite POSITIVE (A) NEGATIVE   Leukocytes, UA LARGE (A) NEGATIVE   RBC / HPF 0-5 0 - 5 RBC/hpf   WBC, UA TOO NUMEROUS TO COUNT 0 - 5 WBC/hpf    Bacteria, UA FEW (A) NONE SEEN   Squamous Epithelial / LPF 6-30 (A) NONE SEEN   Mucus PRESENT    ____________________________________________ ____________________________________________  RADIOLOGY  I personally reviewed all radiographic images ordered to evaluate for the above acute complaints and reviewed radiology reports and findings.  These findings were personally discussed with  the patient.  Please see medical record for radiology report.  ____________________________________________   PROCEDURES  Procedure(s) performed:  Procedures    Critical Care performed: no ____________________________________________   INITIAL IMPRESSION / ASSESSMENT AND PLAN / ED COURSE  Pertinent labs & imaging results that were available during my care of the patient were reviewed by me and considered in my medical decision making (see chart for details).  DDX: pyelo, stone, uti, colitis, appy, pregnancy  Lauren Griffith is a 26 y.o. who presents to the ED with right flank pain and fever and dysuria. Patient has history of recurrent UTIs. Patient does have Sirs criteria but is clinically fairly well-appearing. CT imaging will be ordered due to her right lower quadrant abdominal pain and concern for appendicitis. Patient is not pregnant. We'll give IV fluids for resuscitation pain medication.  The patient will be placed on continuous pulse oximetry and telemetry for monitoring.  Laboratory evaluation will be sent to evaluate for the above complaints.     Clinical Course as of Dec 04 2005  Thu Dec 04, 2016  1828 CT imaging is reassuring. No evidence of stone or appendicitis. Urinalysis is nitrite positive and leukocyte positive few bacteria. I will continue treatment with antibodies for pyelonephritis. Patient states the pain has improved. Will give another bolus of IV fluids she is still mildly tachycardic but her lactate is normal.  [PR]  2005 heart rate down below 100. Patient is tolerating  oral hydration and symptoms improved. Patient appropriate for further workup as an outpatient. Patient be discharged on oral keflex.  [PR]    Clinical Course User Index [PR] Willy Eddy, MD     ____________________________________________   FINAL CLINICAL IMPRESSION(S) / ED DIAGNOSES  Final diagnoses:  Pyelonephritis  Flank pain, acute      NEW MEDICATIONS STARTED DURING THIS VISIT:  New Prescriptions   CEPHALEXIN (KEFLEX) 500 MG CAPSULE    Take 1 capsule (500 mg total) by mouth 3 (three) times daily.   ONDANSETRON (ZOFRAN ODT) 4 MG DISINTEGRATING TABLET    Take 1 tablet (4 mg total) by mouth every 8 (eight) hours as needed for nausea or vomiting.   TRAMADOL (ULTRAM) 50 MG TABLET    Take 1 tablet (50 mg total) by mouth every 6 (six) hours as needed.     Note:  This document was prepared using Dragon voice recognition software and may include unintentional dictation errors.    Willy Eddy, MD 12/04/16 2007

## 2016-12-04 NOTE — ED Triage Notes (Signed)
Patient presents to the ED from her PCP.  Patient is complaining of right flank pain and fever that began today.  Patient states her doctor sent her to the ED due to concern about a possible kidney infection that may need IV antibiotics.  Patient is tachypnic and febrile.  Patient denies urinary frequency and dysuria.  Patient appears slightly uncomfortable in triage.

## 2016-12-04 NOTE — Addendum Note (Signed)
Addended by: Roena Malady on: 12/04/2016 03:19 PM   Modules accepted: Orders

## 2016-12-05 ENCOUNTER — Ambulatory Visit: Payer: Self-pay | Admitting: Primary Care

## 2016-12-07 LAB — URINE CULTURE
MICRO NUMBER: 81041690
SPECIMEN QUALITY:: ADEQUATE

## 2016-12-09 ENCOUNTER — Other Ambulatory Visit: Payer: Self-pay | Admitting: Internal Medicine

## 2016-12-09 ENCOUNTER — Encounter: Payer: Self-pay | Admitting: Internal Medicine

## 2016-12-09 ENCOUNTER — Ambulatory Visit (INDEPENDENT_AMBULATORY_CARE_PROVIDER_SITE_OTHER): Payer: BLUE CROSS/BLUE SHIELD | Admitting: Internal Medicine

## 2016-12-09 ENCOUNTER — Telehealth: Payer: Self-pay | Admitting: Internal Medicine

## 2016-12-09 DIAGNOSIS — Z862 Personal history of diseases of the blood and blood-forming organs and certain disorders involving the immune mechanism: Secondary | ICD-10-CM | POA: Diagnosis not present

## 2016-12-09 DIAGNOSIS — F419 Anxiety disorder, unspecified: Secondary | ICD-10-CM | POA: Diagnosis not present

## 2016-12-09 DIAGNOSIS — F329 Major depressive disorder, single episode, unspecified: Secondary | ICD-10-CM | POA: Diagnosis not present

## 2016-12-09 DIAGNOSIS — F32A Depression, unspecified: Secondary | ICD-10-CM

## 2016-12-09 LAB — CULTURE, BLOOD (ROUTINE X 2)
CULTURE: NO GROWTH
Culture: NO GROWTH

## 2016-12-09 MED ORDER — PREDNISONE 10 MG PO TABS: ORAL_TABLET | ORAL | 0 refills | Status: DC

## 2016-12-09 MED ORDER — BUPROPION HCL ER (XL) 150 MG PO TB24: 150.0000 mg | ORAL_TABLET | Freq: Every day | ORAL | 2 refills | Status: DC

## 2016-12-09 NOTE — Patient Instructions (Signed)

## 2016-12-09 NOTE — Telephone Encounter (Signed)
Patient Name: Lauren Griffith  DOB: 05/14/1990    Initial Comment Caller states she was seen office today and forgot to mention her poison ivy on her arms and back and is wondering what she can take or if the doctor can write a Rx for her.   Nurse Assessment  Nurse: Earlene Plater, RN, Lupita Leash Date/Time Lamount Cohen Time): 12/09/2016 3:57:53 PM  Confirm and document reason for call. If symptomatic, describe symptoms. ---Caller states she was seen office today and forgot to mention her poison ivy on her left arm and back . What she can take or if the doctor can write a Rx for her. No fever. Just very itchy. No fever.  Does the patient have any new or worsening symptoms? ---Yes  Will a triage be completed? ---Yes  Related visit to physician within the last 2 weeks? ---Yes  Does the PT have any chronic conditions? (i.e. diabetes, asthma, etc.) ---No  Is the patient pregnant or possibly pregnant? (Ask all females between the ages of 24-55) ---No  Is this a behavioral health or substance abuse call? ---No     Guidelines    Guideline Title Affirmed Question Affirmed Notes  Poison [place] SEVERE itching (e.g., interferes with sleep or normal activities)    Final Disposition User   See Physician within 24 Hours Earlene Plater, RN, [employer] office. Caller wanted to speak with someone since she was in office this morning.   Referrals  REFERRED TO PCP OFFICE   Caller Disagree/Comply Comply  Caller Understands Yes  PreDisposition Call Doctor

## 2016-12-09 NOTE — Assessment & Plan Note (Signed)
Recent CBC reviewed 

## 2016-12-09 NOTE — Progress Notes (Signed)
HPI  Pt presents to the clinic today to establish care. She is transferring care from [facility].   History of Anemia: Her most recent H/H was 36.4/12.3, 11/2016. She has not noticed any s/s of bleeding.  Anxiety and Depression: She is not sure what triggers this. She is currently going through a separation. She is taking Prozac daily as prescribed. She does not feel as if it is as helpful as it used to be. She has also tried Vistaril as needed without much relief. She reports she feels more depressed than anxious at this time but knows that is situational.  History of Childhood Asthma: She has not had any issues as an adult.  Flu: never Tetanus: ? 2016 Pap Smear: 2016, [facility] Dentist: annually   Past Medical History:  Diagnosis Date  . Anemia   . Anxiety   . Asthma    as a child  . Urinary tract infection     Current Outpatient Prescriptions  Medication Sig Dispense Refill  . cephALEXin (KEFLEX) 500 MG capsule Take 1 capsule (500 mg total) by mouth 3 (three) times daily. 21 capsule 0  . FLUoxetine (PROZAC) 40 MG capsule Take 40 mg by mouth daily.    . ondansetron (ZOFRAN ODT) 4 MG disintegrating tablet Take 1 tablet (4 mg total) by mouth every 8 (eight) hours as needed for nausea or vomiting. 10 tablet 0  . traMADol (ULTRAM) 50 MG tablet Take 1 tablet (50 mg total) by mouth every 6 (six) hours as needed. 10 tablet 0   No current facility-administered medications for this visit.     Allergies  Allergen Reactions  . Amoxicillin Nausea And Vomiting  . Cinnamon Swelling and Other (See Comments)    Mouth swelling. Pt states it is a minor allergy     Family History  Problem Relation Age of Onset  . Diabetes Maternal Grandmother   . Diabetes Maternal Grandfather   . Diabetes Paternal Grandmother   . Diabetes Paternal Grandfather     Social History   Social History  . Marital status: Married    Spouse name: N/A  . Number of children: N/A  .  Years of education: N/A   Occupational History  . Not on file.   Social History Main Topics  . Smoking status: Never Smoker  . Smokeless tobacco: Never Used  . Alcohol use No  . Drug use: No  . Sexual activity: Yes     Comment: last sex 2-3 weeks ago   Other Topics Concern  . Not on file   Social History Narrative  . No narrative on file    ROS:  Constitutional: Denies fever, malaise, fatigue, headache or abrupt weight changes.  HEENT: Denies eye pain, eye redness, ear pain, ringing in the ears, wax buildup, runny nose, nasal congestion, bloody nose, or sore throat. Respiratory: Denies difficulty breathing, shortness of breath, cough or sputum production.   Cardiovascular: Denies chest pain, chest tightness, palpitations or swelling in the hands or feet.  Gastrointestinal: Pt reports right flank pain. Denies abdominal pain, bloating, constipation, diarrhea or blood in the stool.  GU: Denies frequency, urgency, pain with urination, blood in urine, odor or discharge. Musculoskeletal: Denies decrease in range of motion, difficulty with gait, muscle pain or joint pain and swelling.  Skin: Denies redness, rashes, lesions or ulcercations.  Neurological: Denies dizziness, difficulty with memory, difficulty with speech or problems with balance and coordination.  Psych: Pt reports anxiety and depression. Denies SI/HI.  No other specific complaints in a complete review of systems (except as listed in HPI above).  PE:  BP 106/68   Pulse 88   Temp 98 F (36.7 C) (Oral)   Ht 5' 2.75" (1.594 m)   Wt 117 lb 8 oz (53.3 kg)   LMP 11/16/2016   SpO2 99%   BMI 20.98 kg/m   Wt Readings from Last 3 Encounters:  12/09/16 117 lb 8 oz (53.3 kg)  12/04/16 120 lb (54.4 kg)  12/04/16 120 lb (54.4 kg)    General: Appears her stated age, well developed, well nourished in NAD. Cardiovascular: Normal rate and rhythm. S1,S2 noted.  No murmur, rubs or gallops noted.  Pulmonary/Chest: Normal  effort and positive vesicular breath sounds. No respiratory distress. No wheezes, rales or ronchi noted.  Abdomen: Soft and nontender. Normal bowel sounds. No distention or masses noted.  Neurological: Alert and oriented.  Psychiatric: Mood and affect normal. Behavior is normal. Judgment and thought content normal.     BMET    Component Value Date/Time   NA 133 (L) 12/04/2016 1612   K 3.4 (L) 12/04/2016 1612   CL 102 12/04/2016 1612   CO2 22 12/04/2016 1612   GLUCOSE 127 (H) 12/04/2016 1612   BUN 12 12/04/2016 1612   CREATININE 0.87 12/04/2016 1612   CALCIUM 9.2 12/04/2016 1612   GFRNONAA >60 12/04/2016 1612   GFRAA >60 12/04/2016 1612    Lipid Panel  No results found for: CHOL, TRIG, HDL, CHOLHDL, VLDL, LDLCALC  CBC    Component Value Date/Time   WBC 12.0 (H) 12/04/2016 1612   RBC 4.40 12/04/2016 1612   HGB 12.3 12/04/2016 1612   HCT 36.4 12/04/2016 1612   PLT 271 12/04/2016 1612   MCV 82.8 12/04/2016 1612   MCH 27.9 12/04/2016 1612   MCHC 33.7 12/04/2016 1612   RDW 13.5 12/04/2016 1612   LYMPHSABS 0.6 (L) 12/04/2016 1612   MONOABS 1.0 (H) 12/04/2016 1612   EOSABS 0.0 12/04/2016 1612   BASOSABS 0.0 12/04/2016 1612    Hgb A1C No results found for: HGBA1C   Assessment and Plan:

## 2016-12-09 NOTE — Assessment & Plan Note (Signed)
Deteriorated Continue Prozac Add in Wellbutrin  RTC in 1 month for follow up anxiety, depression and annual exam

## 2016-12-10 NOTE — Telephone Encounter (Signed)
I sent her in Prednisone for this yesterday

## 2016-12-12 ENCOUNTER — Other Ambulatory Visit: Payer: Self-pay | Admitting: Internal Medicine

## 2016-12-12 ENCOUNTER — Encounter: Payer: Self-pay | Admitting: Internal Medicine

## 2016-12-12 MED ORDER — FLUCONAZOLE 150 MG PO TABS: 150.0000 mg | ORAL_TABLET | Freq: Once | ORAL | 0 refills | Status: AC

## 2016-12-18 NOTE — Progress Notes (Deleted)
12/22/2016 1:35 PM   Lauren Griffith 07-04-1990 [id number]  Referring provider: No referring provider defined for this encounter.  No chief complaint on file.   HPI: Patient is a 26 -year-old Caucasian female who is referred to [place] by, the ED, for recurrent urinary tract infections.  Patient states that she has had *** urinary tract infections over the last year.  Reviewing her records,  she has had one documented positive urine culture for Escherichia coli over the last year.   Her symptoms with a urinary tract infection consist of ***.  She denies/endorses dysuria, gross hematuria, suprapubic pain, back pain, abdominal pain or flank pain.***  She has not had any recent fevers, chills, nausea or vomiting. ***  She does/does not have a history of nephrolithiasis, GU surgery or GU trauma. ***  She is/is not sexually active.  She has/has not noted a correlation with her urinary tract infections and sexual intercourse.  ***   She does/does not engage in anal sex. ***  She is/ is not having anal to vaginal sex.*** She is/is not voiding before and after sex. ***     She is/is not postmenopausal. ***  She admits to/denies constipation and/or diarrhea. ***  She does/does not use tampons.  She does/does not engage in good perineal hygiene. She does/does not take tub baths. ***  She has/does not have incontinence.  She is using incontinence pads. ***  She is having/ not having pain with bladder filling.  ***  Contrast CT performed on 12/04/2016 in the ED noted no abnormalities.    She is drinking *** of water daily.     Reviewed referral notes.    PMH: Past Medical History:  Diagnosis Date  . Anemia   . Anxiety   . Asthma    as a child  . Urinary tract infection     Surgical History: Past Surgical History:  Procedure Laterality Date  . DILATION AND CURETTAGE OF UTERUS      Home Medications:  Allergies as of 12/22/2016      Reactions   Amoxicillin Nausea  And Vomiting   Cinnamon Swelling, Other (See Comments)   Mouth swelling. Pt states it is a minor allergy       Medication List       Accurate as of 12/18/16  1:35 PM. Always use your most recent med list.          buPROPion 150 MG 24 hr tablet Commonly known as:  WELLBUTRIN XL Take 1 tablet (150 mg total) by mouth daily.   FLUoxetine 40 MG capsule Commonly known as:  PROZAC Take 40 mg by mouth daily.   ondansetron 4 MG disintegrating tablet Commonly known as:  ZOFRAN ODT Take 1 tablet (4 mg total) by mouth every 8 (eight) hours as needed for nausea or vomiting.   predniSONE 10 MG tablet Commonly known as:  DELTASONE Take 3 tabs on days 1-2, take 2 tabs on days 3-4, take 1 tab on days 5-6   traMADol 50 MG tablet Commonly known as:  ULTRAM Take 1 tablet (50 mg total) by mouth every 6 (six) hours as needed.       Allergies:  Allergies  Allergen Reactions  . Amoxicillin Nausea And Vomiting  . Cinnamon Swelling and Other (See Comments)    Mouth swelling. Pt states it is a minor allergy     Family History: Family History  Problem Relation Age of Onset  . Diabetes Maternal Grandmother   .  Diabetes Maternal Grandfather   . Diabetes Paternal Grandmother   . Diabetes Paternal Grandfather     Social History:  reports that she has never smoked. She has never used smokeless tobacco. She reports that she does not drink alcohol or use drugs.  ROS:                                        Physical Exam: There were no vitals taken for this visit.  Constitutional: Well nourished. Alert and oriented, No acute distress. HEENT: [place] AT, moist mucus membranes. Trachea midline, no masses. Cardiovascular: No clubbing, cyanosis, or edema. Respiratory: Normal respiratory effort, no increased work of breathing. GI: Abdomen is soft, non tender, non distended, no abdominal masses. Liver and spleen not palpable.  No hernias appreciated.  Stool sample for occult  testing is not indicated.   GU: No CVA tenderness.  No bladder fullness or masses.  Normal external genitalia, normal pubic hair distribution, no lesions.  Normal urethral meatus, no lesions, no prolapse, no discharge.   No urethral masses, tenderness and/or tenderness. No bladder fullness, tenderness or masses. Normal vagina mucosa, good estrogen effect, no discharge, no lesions, good pelvic support, no cystocele or rectocele noted.  No cervical motion tenderness.  Uterus is freely mobile and non-fixed.  No adnexal/parametria masses or tenderness noted.  Anus and perineum are without rashes or lesions.   *** Skin: No rashes, bruises or suspicious lesions. Lymph: No cervical or inguinal adenopathy. Neurologic: Grossly intact, no focal deficits, moving all 4 extremities. Psychiatric: Normal mood and affect.  Laboratory Data: Lab Results  Component Value Date   WBC 12.0 (H) 12/04/2016   HGB 12.3 12/04/2016   HCT 36.4 12/04/2016   MCV 82.8 12/04/2016   PLT 271 12/04/2016    Lab Results  Component Value Date   CREATININE 0.87 12/04/2016    Lab Results  Component Value Date   AST 23 12/04/2016   Lab Results  Component Value Date   ALT 11 (L) 12/04/2016    Urinalysis ***  I have reviewed the labs.   Pertinent Imaging: CLINICAL DATA:  Right flank pain, fever, and chills for 1 day.  EXAM: CT ABDOMEN AND PELVIS WITH CONTRAST  TECHNIQUE: Multidetector CT imaging of the abdomen and pelvis was performed using the standard protocol following bolus administration of intravenous contrast.  CONTRAST:  118m ISOVUE-300 IOPAMIDOL (ISOVUE-300) INJECTION 61%  COMPARISON:  None.  FINDINGS: Lower Chest: No acute findings.  Hepatobiliary: No hepatic masses identified. Gallbladder is unremarkable.  Pancreas:  No mass or inflammatory changes.  Spleen: Within normal limits in size and appearance.  Adrenals/Urinary Tract: No masses identified. No evidence  of hydronephrosis.  Stomach/Bowel: No evidence of obstruction, inflammatory process or abnormal fluid collections. Normal appendix visualized.  Vascular/Lymphatic: No pathologically enlarged lymph nodes. No abdominal aortic aneurysm.  Reproductive:  No mass or other significant abnormality.  Other:  None.  Musculoskeletal:  No suspicious bone lesions identified.  IMPRESSION: No acute findings or other significant abnormality identified.   Electronically Signed   By: JEarle GellM.D.   On: 12/04/2016 18:22 I have independently reviewed the films.    Assessment & Plan:  ***   - criteria for recurrent UTI has been met with 2 or more infections in 6 months or 3 or greater infections in one year ***  - Patient is instructed to increase their water intake until  the urine is pale yellow or clear (10 to 12 cups daily) ***  - probiotics (yogurt, oral pills or vaginal suppositories), take cranberry pills or drink the juice and Vitamin C 1,000 mg daily to acidify the urine should be added to their daily regimen ***  -. if using tampons, she should remove them prior to urinating and change them often ***  -avoid soaking in tubs and wipe front to back after urinating ***  - benefit from core strengthening exercises has been seen.  We can refer her to PT if they desire ***  - advised them to have CATH UA's for urinalysis and culture to prevent skin contamination of the specimen  - reviewed symptoms of UTI and advised not to have urine checked or be treated for UTI if not experiencing symptoms  - discussed antibiotic stewardship with the patient                                                   No Follow-up on file.  These notes generated with voice recognition software. I apologize for typographical errors.  Zara Council, [facility] [address] [place], [place] [address] [phone]

## 2016-12-22 ENCOUNTER — Ambulatory Visit: Payer: Self-pay | Admitting: Urology

## 2016-12-22 ENCOUNTER — Encounter: Payer: Self-pay | Admitting: Urology

## 2017-01-06 ENCOUNTER — Ambulatory Visit: Payer: BLUE CROSS/BLUE SHIELD | Admitting: Internal Medicine

## 2017-01-06 DIAGNOSIS — Z0289 Encounter for other administrative examinations: Secondary | ICD-10-CM

## 2017-01-06 NOTE — Progress Notes (Deleted)
Subjective:    Patient ID: Lauren Griffith, female    DOB: 02/17/1991, 26 y.o.   MRN: [id number]  HPI  Pt presents to the clinic today for 1 month follow up of anxiety and depression. She reports this has been worse recently due to. She was continued on her Prozac and Wellbutrin was added to her regimen. She has been taking both medications as prescribed. She feels like,  Review of Systems  Past Medical History:  Diagnosis Date  . Anemia   . Anxiety   . Asthma    as a child  . Urinary tract infection     Current Outpatient Prescriptions  Medication Sig Dispense Refill  . buPROPion (WELLBUTRIN XL) 150 MG 24 hr tablet Take 1 tablet (150 mg total) by mouth daily. 30 tablet 2  . FLUoxetine (PROZAC) 40 MG capsule Take 40 mg by mouth daily.    . ondansetron (ZOFRAN ODT) 4 MG disintegrating tablet Take 1 tablet (4 mg total) by mouth every 8 (eight) hours as needed for nausea or vomiting. 10 tablet 0  . predniSONE (DELTASONE) 10 MG tablet Take 3 tabs on days 1-2, take 2 tabs on days 3-4, take 1 tab on days 5-6 12 tablet 0  . traMADol (ULTRAM) 50 MG tablet Take 1 tablet (50 mg total) by mouth every 6 (six) hours as needed. 10 tablet 0   No current facility-administered medications for this visit.     Allergies  Allergen Reactions  . Amoxicillin Nausea And Vomiting  . Cinnamon Swelling and Other (See Comments)    Mouth swelling. Pt states it is a minor allergy     Family History  Problem Relation Age of Onset  . Diabetes Maternal Grandmother   . Diabetes Maternal Grandfather   . Diabetes Paternal Grandmother   . Diabetes Paternal Grandfather     Social History   Social History  . Marital status: Married    Spouse name: N/A  . Number of children: N/A  . Years of education: N/A   Occupational History  . Not on file.   Social History Main Topics  . Smoking status: Never Smoker  . Smokeless tobacco: Never Used  . Alcohol use No  . Drug use: No  . Sexual activity: Yes       Comment: last sex 2-3 weeks ago   Other Topics Concern  . Not on file   Social History Narrative  . No narrative on file     Constitutional: Denies fever, malaise, fatigue, headache or abrupt weight changes.  HEENT: Denies eye pain, eye redness, ear pain, ringing in the ears, wax buildup, runny nose, nasal congestion, bloody nose, or sore throat. Respiratory: Denies difficulty breathing, shortness of breath, cough or sputum production.   Cardiovascular: Denies chest pain, chest tightness, palpitations or swelling in the hands or feet.  Gastrointestinal: Denies abdominal pain, bloating, constipation, diarrhea or blood in the stool.  GU: Denies urgency, frequency, pain with urination, burning sensation, blood in urine, odor or discharge. Musculoskeletal: Denies decrease in range of motion, difficulty with gait, muscle pain or joint pain and swelling.  Skin: Denies redness, rashes, lesions or ulcercations.  Neurological: Denies dizziness, difficulty with memory, difficulty with speech or problems with balance and coordination.  Psych: Pt reports anxiety and depression. Denies SI/HI.  No other specific complaints in a complete review of systems (except as listed in HPI above).     Objective:   Physical Exam  Assessment & Plan:

## 2017-01-16 ENCOUNTER — Emergency Department: Payer: BLUE CROSS/BLUE SHIELD

## 2017-01-16 ENCOUNTER — Emergency Department
Admission: EM | Admit: 2017-01-16 | Discharge: 2017-01-16 | Disposition: A | Discharge status: Home / Self Care | Payer: BLUE CROSS/BLUE SHIELD | Attending: Emergency Medicine | Admitting: Emergency Medicine

## 2017-01-16 ENCOUNTER — Encounter: Payer: Self-pay | Admitting: Emergency Medicine

## 2017-01-16 DIAGNOSIS — M79671 Pain in right foot: Secondary | ICD-10-CM | POA: Diagnosis not present

## 2017-01-16 DIAGNOSIS — M545 Low back pain, unspecified: Secondary | ICD-10-CM

## 2017-01-16 DIAGNOSIS — Z79899 Other long term (current) drug therapy: Secondary | ICD-10-CM | POA: Diagnosis not present

## 2017-01-16 DIAGNOSIS — Y939 Activity, unspecified: Secondary | ICD-10-CM | POA: Diagnosis not present

## 2017-01-16 DIAGNOSIS — J45909 Unspecified asthma, uncomplicated: Secondary | ICD-10-CM | POA: Diagnosis not present

## 2017-01-16 DIAGNOSIS — Y999 Unspecified external cause status: Secondary | ICD-10-CM | POA: Insufficient documentation

## 2017-01-16 DIAGNOSIS — S199XXA Unspecified injury of neck, initial encounter: Secondary | ICD-10-CM | POA: Diagnosis not present

## 2017-01-16 DIAGNOSIS — M542 Cervicalgia: Secondary | ICD-10-CM | POA: Insufficient documentation

## 2017-01-16 DIAGNOSIS — Y929 Unspecified place or not applicable: Secondary | ICD-10-CM | POA: Insufficient documentation

## 2017-01-16 DIAGNOSIS — S3992XA Unspecified injury of lower back, initial encounter: Secondary | ICD-10-CM | POA: Diagnosis not present

## 2017-01-16 MED ORDER — CYCLOBENZAPRINE HCL 5 MG PO TABS: 5.0000 mg | ORAL_TABLET | Freq: Three times a day (TID) | ORAL | 0 refills | Status: DC | PRN
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
## 2017-01-16 NOTE — ED Notes (Signed)
Pt opted to sign waiver form instead of doing pregnancy test
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
## 2017-01-20 ENCOUNTER — Ambulatory Visit: Payer: BLUE CROSS/BLUE SHIELD | Admitting: Internal Medicine

## 2017-01-22 ENCOUNTER — Ambulatory Visit: Payer: BLUE CROSS/BLUE SHIELD | Admitting: Internal Medicine

## 2017-01-22 ENCOUNTER — Telehealth: Payer: Self-pay | Admitting: Internal Medicine

## 2017-01-22 DIAGNOSIS — Z0289 Encounter for other administrative examinations: Secondary | ICD-10-CM
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
## 2017-01-23 NOTE — Telephone Encounter (Signed)
Left detailed msg on VM per HIPAA  

## 2017-01-23 NOTE — Telephone Encounter (Signed)
I saw her yesterday and she had reported improvement and did not request any Flexeril.

## 2017-01-29 ENCOUNTER — Inpatient Hospital Stay: Payer: BLUE CROSS/BLUE SHIELD | Admitting: Internal Medicine

## 2017-02-03 ENCOUNTER — Ambulatory Visit (INDEPENDENT_AMBULATORY_CARE_PROVIDER_SITE_OTHER): Payer: BLUE CROSS/BLUE SHIELD | Admitting: Internal Medicine

## 2017-02-03 ENCOUNTER — Encounter: Payer: Self-pay | Admitting: Internal Medicine

## 2017-02-03 DIAGNOSIS — M542 Cervicalgia: Secondary | ICD-10-CM | POA: Diagnosis not present
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
## 2017-02-03 NOTE — Patient Instructions (Signed)
Muscle Strain A muscle strain (pulled muscle) happens when a muscle is stretched beyond normal length. It happens when a sudden, violent force stretches your muscle too far. Usually, a few of the fibers in your muscle are torn. Muscle strain is common in athletes. Recovery usually takes 1-2 weeks. Complete healing takes 5-6 weeks. Follow these instructions at home:  Follow the PRICE method of treatment to help your injury get better. Do this the first 2-3 days after the injury: ? Protect. Protect the muscle to keep it from getting injured again. ? Rest. Limit your activity and rest the injured body part. ? Ice. Put ice in a plastic bag. Place a towel between your skin and the bag. Then, apply the ice and leave it on from 15-20 minutes each hour. After the third day, switch to moist heat packs. ? Compression. Use a splint or elastic bandage on the injured area for comfort. Do not put it on too tightly. ? Elevate. Keep the injured body part above the level of your heart.  Only take medicine as told by your doctor.  Warm up before doing exercise to prevent future muscle strains. Contact a doctor if:  You have more pain or puffiness (swelling) in the injured area.  You feel numbness, tingling, or notice a loss of strength in the injured area. This information is not intended to replace advice given to you by your health care provider. Make sure you discuss any questions you have with your health care provider. Document Released: 12/11/2007 Document Revised: 08/09/2015 Document Reviewed: 09/30/2012 Elsevier Interactive Patient Education  2017 [employer] Inc[employer]  

## 2017-02-11 ENCOUNTER — Encounter: Payer: Self-pay | Admitting: Internal Medicine

## 2017-02-11 DIAGNOSIS — M542 Cervicalgia: Secondary | ICD-10-CM

## 2017-02-12 ENCOUNTER — Other Ambulatory Visit: Payer: Self-pay | Admitting: Internal Medicine

## 2017-03-03 ENCOUNTER — Ambulatory Visit: Payer: BLUE CROSS/BLUE SHIELD | Attending: Internal Medicine | Admitting: Physical Therapy

## 2017-03-05 ENCOUNTER — Encounter: Payer: BLUE CROSS/BLUE SHIELD | Admitting: Physical Therapy

## 2017-03-12 ENCOUNTER — Encounter: Payer: BLUE CROSS/BLUE SHIELD | Admitting: Physical Therapy

## 2017-03-24 ENCOUNTER — Encounter: Payer: Self-pay | Admitting: Internal Medicine

## 2017-03-24 ENCOUNTER — Ambulatory Visit (INDEPENDENT_AMBULATORY_CARE_PROVIDER_SITE_OTHER): Payer: BLUE CROSS/BLUE SHIELD | Admitting: Internal Medicine

## 2017-03-24 VITALS — BP 106/74 | HR 95 | Temp 98.0°F | Wt 124.0 lb

## 2017-03-24 DIAGNOSIS — J3089 Other allergic rhinitis: Secondary | ICD-10-CM

## 2017-03-24 MED ORDER — METHYLPREDNISOLONE ACETATE 80 MG/ML IJ SUSP
80.0000 mg | Freq: Once | INTRAMUSCULAR | Status: AC
  Administered 2017-03-24: 80 mg via INTRAMUSCULAR

## 2017-03-24 NOTE — Addendum Note (Signed)
Addended by: Roena MaladyEVONTENNO, Arrayah Connors Y on: 03/24/2017 04:23 PM   Modules accepted: Orders

## 2017-03-24 NOTE — Progress Notes (Signed)
Subjective:    Patient ID: Lauren Griffith, female    DOB: 1991-02-20, 27 y.o.   MRN: [id number]  HPI  Pt presents to the clinic today with c/o headache and sore throat. This started 2-3 weeks ago. The headache is located in her forehead. She describes the pain as pressure. She denies visual changes or dizziness. She denies runny nose, nasal congestion or cough. She denies fever, chills or body aches. She has tried Zyrtec, Sudafed and Ibuprofen with some relief. She does have a history of seasonal allergies. She has not had sick contacts that she is aware of.  Review of Systems      Past Medical History:  Diagnosis Date  . Anemia   . Anxiety   . Asthma    as a child  . Urinary tract infection     Current Outpatient Medications  Medication Sig Dispense Refill  . buPROPion (WELLBUTRIN XL) 150 MG 24 hr tablet Take 1 tablet (150 mg total) by mouth daily. 30 tablet 2  . FLUoxetine (PROZAC) 40 MG capsule Take 40 mg by mouth daily.     No current facility-administered medications for this visit.     Allergies  Allergen Reactions  . Amoxicillin Nausea And Vomiting  . Cinnamon Swelling and Other (See Comments)    Mouth swelling. Pt states it is a minor allergy     Family History  Problem Relation Age of Onset  . Diabetes Maternal Grandmother   . Diabetes Maternal Grandfather   . Diabetes Paternal Grandmother   . Diabetes Paternal Grandfather     Social History   Socioeconomic History  . Marital status: Married    Spouse name: Not on file  . Number of children: Not on file  . Years of education: Not on file  . Highest education level: Not on file  Social Needs  . Financial resource strain: Not on file  . Food insecurity - worry: Not on file  . Food insecurity - inability: Not on file  . Transportation needs - medical: Not on file  . Transportation needs - non-medical: Not on file  Occupational History  . Not on file  Tobacco Use  . Smoking status: Never Smoker  .  Smokeless tobacco: Never Used  Substance and Sexual Activity  . Alcohol use: No  . Drug use: No  . Sexual activity: Yes    Comment: last sex 2-3 weeks ago  Other Topics Concern  . Not on file  Social History Narrative  . Not on file     Constitutional: Pt reports headache. Denies fever, malaise, fatigue, or abrupt weight changes.  HEENT: Pt reports sore throat. Denies eye pain, eye redness, ear pain, ringing in the ears, wax buildup, runny nose, nasal congestion, bloody nose. Respiratory: Denies difficulty breathing, shortness of breath, cough or sputum production.    No other specific complaints in a complete review of systems (except as listed in HPI above).  Objective:   Physical Exam  BP 106/74   Pulse 95   Temp 98 F (36.7 C) (Oral)   Wt 124 lb (56.2 kg)   LMP 03/22/2017   SpO2 98%   BMI 21.97 kg/m  Wt Readings from Last 3 Encounters:  03/24/17 124 lb (56.2 kg)  02/03/17 125 lb (56.7 kg)  01/16/17 120 lb (54.4 kg)    General: Appears her stated age, well developed, well nourished in NAD. HEENT: Head: normal shape and size, no sinus tenderness noted; Ears: Tm's gray and intact,  normal light reflex; Nose: mucosa pink and moist, septum midline; Throat/Mouth: Teeth present, mucosa pink and moist, + PND, no exudate, lesions or ulcerations noted.  Neck:  No adenopathy noted. Pulmonary/Chest: Normal effort and positive vesicular breath sounds. No respiratory distress. No wheezes, rales or ronchi noted.   BMET    Component Value Date/Time   NA 133 (L) 12/04/2016 1612   K 3.4 (L) 12/04/2016 1612   CL 102 12/04/2016 1612   CO2 22 12/04/2016 1612   GLUCOSE 127 (H) 12/04/2016 1612   BUN 12 12/04/2016 1612   CREATININE 0.87 12/04/2016 1612   CALCIUM 9.2 12/04/2016 1612   GFRNONAA >60 12/04/2016 1612   GFRAA >60 12/04/2016 1612    Lipid Panel  No results found for: CHOL, TRIG, HDL, CHOLHDL, VLDL, LDLCALC  CBC    Component Value Date/Time   WBC 12.0 (H) 12/04/2016  1612   RBC 4.40 12/04/2016 1612   HGB 12.3 12/04/2016 1612   HCT 36.4 12/04/2016 1612   PLT 271 12/04/2016 1612   MCV 82.8 12/04/2016 1612   MCH 27.9 12/04/2016 1612   MCHC 33.7 12/04/2016 1612   RDW 13.5 12/04/2016 1612   LYMPHSABS 0.6 (L) 12/04/2016 1612   MONOABS 1.0 (H) 12/04/2016 1612   EOSABS 0.0 12/04/2016 1612   BASOSABS 0.0 12/04/2016 1612    Hgb A1C No results found for: HGBA1C          Assessment & Plan:    Allergic Rhinitis:  80 mg Depo IM today Continue Zyrtec Add in Flonase  Return precautions discussed Nicki ReaperBAITY, REGINA, NP

## 2017-03-24 NOTE — Patient Instructions (Signed)

## 2017-06-13 DIAGNOSIS — L559 Sunburn, unspecified: Secondary | ICD-10-CM | POA: Diagnosis not present

## 2017-06-13 DIAGNOSIS — R1110 Vomiting, unspecified: Secondary | ICD-10-CM | POA: Diagnosis not present

## 2017-06-13 DIAGNOSIS — R112 Nausea with vomiting, unspecified: Secondary | ICD-10-CM | POA: Diagnosis not present

## 2017-07-06 ENCOUNTER — Encounter: Payer: Self-pay | Admitting: Internal Medicine

## 2017-07-07 MED ORDER — BUPROPION HCL ER (XL) 150 MG PO TB24: 150.0000 mg | ORAL_TABLET | Freq: Every day | ORAL | 0 refills | Status: DC

## 2017-07-07 MED ORDER — FLUOXETINE HCL 40 MG PO CAPS: 40.0000 mg | ORAL_CAPSULE | Freq: Every day | ORAL | 0 refills | Status: DC

## 2017-07-19 DIAGNOSIS — R21 Rash and other nonspecific skin eruption: Secondary | ICD-10-CM | POA: Diagnosis not present

## 2017-09-08 ENCOUNTER — Ambulatory Visit (INDEPENDENT_AMBULATORY_CARE_PROVIDER_SITE_OTHER): Payer: Self-pay | Admitting: Internal Medicine

## 2017-09-08 ENCOUNTER — Encounter: Payer: Self-pay | Admitting: Internal Medicine

## 2017-09-08 VITALS — BP 104/68 | HR 96 | Temp 98.1°F | Wt 123.0 lb

## 2017-09-08 DIAGNOSIS — F32A Depression, unspecified: Secondary | ICD-10-CM

## 2017-09-08 DIAGNOSIS — F419 Anxiety disorder, unspecified: Secondary | ICD-10-CM

## 2017-09-08 DIAGNOSIS — F329 Major depressive disorder, single episode, unspecified: Secondary | ICD-10-CM

## 2017-09-14 ENCOUNTER — Encounter: Payer: Self-pay | Admitting: Internal Medicine

## 2017-09-14 NOTE — Progress Notes (Signed)
Subjective:    Patient ID: Lauren Griffith, female    DOB: 11/12/1990, 27 y.o.   MRN: [id number]  HPI  Pt presents to the clinic today to follow up anxiety and depression. She reports she has recently been doing well on the Fluoxetine. She is getting divorced and she is not getting along well with her spouse. She has started feeling very anxious and tearful in the last 2-3 weeks. She denies any other recent changes. She denies SI/HI.  Review of Systems      Past Medical History:  Diagnosis Date  . Anemia   . Anxiety   . Asthma    as a child  . Urinary tract infection     Current Outpatient Medications  Medication Sig Dispense Refill  . buPROPion (WELLBUTRIN XL) 150 MG 24 hr tablet Take 1 tablet (150 mg total) by mouth daily. 90 tablet 0  . FLUoxetine (PROZAC) 40 MG capsule Take 1 capsule (40 mg total) by mouth daily. 90 capsule 0   No current facility-administered medications for this visit.     Allergies  Allergen Reactions  . Amoxicillin Nausea And Vomiting  . Cinnamon Swelling and Other (See Comments)    Mouth swelling. Pt states it is a minor allergy     Family History  Problem Relation Age of Onset  . Diabetes Maternal Grandmother   . Diabetes Maternal Grandfather   . Diabetes Paternal Grandmother   . Diabetes Paternal Grandfather     Social History   Socioeconomic History  . Marital status: Married    Spouse name: Not on file  . Number of children: Not on file  . Years of education: Not on file  . Highest education level: Not on file  Occupational History  . Not on file  Social Needs  . Financial resource strain: Not on file  . Food insecurity:    Worry: Not on file    Inability: Not on file  . Transportation needs:    Medical: Not on file    Non-medical: Not on file  Tobacco Use  . Smoking status: Never Smoker  . Smokeless tobacco: Never Used  Substance and Sexual Activity  . Alcohol use: No  . Drug use: No  . Sexual activity: Yes   Comment: last sex 2-3 weeks ago  Lifestyle  . Physical activity:    Days per week: Not on file    Minutes per session: Not on file  . Stress: Not on file  Relationships  . Social connections:    Talks on phone: Not on file    Gets together: Not on file    Attends religious service: Not on file    Active member of club or organization: Not on file    Attends meetings of clubs or organizations: Not on file    Relationship status: Not on file  . Intimate partner violence:    Fear of current or ex partner: Not on file    Emotionally abused: Not on file    Physically abused: Not on file    Forced sexual activity: Not on file  Other Topics Concern  . Not on file  Social History Narrative  . Not on file     Constitutional: Denies fever, malaise, fatigue, headache or abrupt weight changes.  Neurological: Denies dizziness, difficulty with memory, difficulty with speech or problems with balance and coordination.  Psych: Pt reports anxiety and depression. Denies SI/HI.  No other specific complaints in a complete review of  systems (except as listed in HPI above).  Objective:   Physical Exam   BP 104/68   Pulse 96   Temp 98.1 F (36.7 C) (Oral)   Wt 123 lb (55.8 kg)   LMP 08/29/2017   SpO2 98%   BMI 21.79 kg/m  Wt Readings from Last 3 Encounters:  09/08/17 123 lb (55.8 kg)  03/24/17 124 lb (56.2 kg)  02/03/17 125 lb (56.7 kg)    General: Appears her stated age, well developed, well nourished in NAD. Cardiovascular: Normal rate and rhythm. S1,S2 noted.  No murmur, rubs or gallops noted.  Pulmonary/Chest: Normal effort and positive vesicular breath sounds. No respiratory distress. No wheezes, rales or ronchi noted.  Neurological: Alert and oriented.  Psychiatric: Mood and affect mildly flat. Behavior is normal. Judgment and thought content normal.    BMET    Component Value Date/Time   NA 133 (L) 12/04/2016 1612   K 3.4 (L) 12/04/2016 1612   CL 102 12/04/2016 1612    CO2 22 12/04/2016 1612   GLUCOSE 127 (H) 12/04/2016 1612   BUN 12 12/04/2016 1612   CREATININE 0.87 12/04/2016 1612   CALCIUM 9.2 12/04/2016 1612   GFRNONAA >60 12/04/2016 1612   GFRAA >60 12/04/2016 1612    Lipid Panel  No results found for: CHOL, TRIG, HDL, CHOLHDL, VLDL, LDLCALC  CBC    Component Value Date/Time   WBC 12.0 (H) 12/04/2016 1612   RBC 4.40 12/04/2016 1612   HGB 12.3 12/04/2016 1612   HCT 36.4 12/04/2016 1612   PLT 271 12/04/2016 1612   MCV 82.8 12/04/2016 1612   MCH 27.9 12/04/2016 1612   MCHC 33.7 12/04/2016 1612   RDW 13.5 12/04/2016 1612   LYMPHSABS 0.6 (L) 12/04/2016 1612   MONOABS 1.0 (H) 12/04/2016 1612   EOSABS 0.0 12/04/2016 1612   BASOSABS 0.0 12/04/2016 1612    Hgb A1C No results found for: HGBA1C         Assessment & Plan:

## 2017-09-14 NOTE — Assessment & Plan Note (Signed)
Deteriorated Support offered today Continue Fluoxetine Will trial Wellbutrin, eRx sent to pharmacy Offered referral for therapy but she declines at this time  Let me know how you are doing in 1 month via mychart

## 2017-09-14 NOTE — Patient Instructions (Signed)

## 2017-09-15 ENCOUNTER — Encounter: Payer: Self-pay | Admitting: Internal Medicine

## 2017-11-01 ENCOUNTER — Other Ambulatory Visit: Payer: Self-pay | Admitting: Internal Medicine

## 2017-11-03 ENCOUNTER — Encounter: Payer: Self-pay | Admitting: Internal Medicine

## 2017-11-03 ENCOUNTER — Ambulatory Visit (INDEPENDENT_AMBULATORY_CARE_PROVIDER_SITE_OTHER): Payer: BLUE CROSS/BLUE SHIELD | Admitting: Internal Medicine

## 2017-11-03 VITALS — BP 106/68 | HR 92 | Temp 98.4°F | Wt 122.0 lb

## 2017-11-03 DIAGNOSIS — L255 Unspecified contact dermatitis due to plants, except food: Secondary | ICD-10-CM

## 2017-11-03 MED ORDER — PREDNISONE 10 MG PO TABS: ORAL_TABLET | ORAL | 0 refills | Status: DC

## 2017-11-03 NOTE — Patient Instructions (Signed)
Contact Dermatitis Dermatitis is redness, soreness, and swelling (inflammation) of the skin. Contact dermatitis is a reaction to certain substances that touch the skin. You either touched something that irritated your skin, or you have allergies to something you touched. Follow these instructions at home: Skin Care  Moisturize your skin as needed.  Apply cool compresses to the affected areas.  Try taking a bath with: ? Epsom salts. Follow the instructions on the package. You can get these at a pharmacy or grocery store. ? Baking soda. Pour a small amount into the bath as told by your doctor. ? Colloidal oatmeal. Follow the instructions on the package. You can get this at a pharmacy or grocery store.  Try applying baking soda paste to your skin. Stir water into baking soda until it looks like paste.  Do not scratch your skin.  Bathe less often.  Bathe in lukewarm water. Avoid using hot water. Medicines  Take or apply over-the-counter and prescription medicines only as told by your doctor.  If you were prescribed an antibiotic medicine, take or apply your antibiotic as told by your doctor. Do not stop taking the antibiotic even if your condition starts to get better. General instructions  Keep all follow-up visits as told by your doctor. This is important.  Avoid the substance that caused your reaction. If you do not know what caused it, keep a journal to try to track what caused it. Write down: ? What you eat. ? What cosmetic products you use. ? What you drink. ? What you wear in the affected area. This includes jewelry.  If you were given a bandage (dressing), take care of it as told by your doctor. This includes when to change and remove it. Contact a doctor if:  You do not get better with treatment.  Your condition gets worse.  You have signs of infection such as: ? Swelling. ? Tenderness. ? Redness. ? Soreness. ? Warmth.  You have a fever.  You have new  symptoms. Get help right away if:  You have a very bad headache.  You have neck pain.  Your neck is stiff.  You throw up (vomit).  You feel very sleepy.  You see red streaks coming from the affected area.  Your bone or joint underneath the affected area becomes painful after the skin has healed.  The affected area turns darker.  You have trouble breathing. This information is not intended to replace advice given to you by your health care provider. Make sure you discuss any questions you have with your health care provider. Document Released: 12/29/2008 Document Revised: 08/09/2015 Document Reviewed: 07/19/2014 Elsevier Interactive Patient Education  2018 [employer] Inc[employer]  

## 2017-11-03 NOTE — Progress Notes (Signed)
Subjective:    Patient ID: Lauren Griffith, female    DOB: 10/12/1990, 27 y.o.   MRN: [id number]  HPI  Pt presents to the clinic today with c/o a rash on her upper thighs. She noticed this 5 days ago. The rash has spread to the right side of her abdomen. It is very itchy. She has not come in contact with anything that she is allergic to. She denies changes in soaps, lotions or detergents. She reports she does have an dog who may have gotten into some poison ivy, but she is not positive. She has tried [employer] Lotion with minimal relief.  Review of Systems      Past Medical History:  Diagnosis Date  . Anemia   . Anxiety   . Asthma    as a child  . Urinary tract infection     Current Outpatient Medications  Medication Sig Dispense Refill  . buPROPion (WELLBUTRIN XL) 150 MG 24 hr tablet TAKE 1 TABLET BY MOUTH EVERY DAY 90 tablet 0  . FLUoxetine (PROZAC) 40 MG capsule TAKE 1 CAPSULE BY MOUTH EVERY DAY 90 capsule 0  . predniSONE (DELTASONE) 10 MG tablet Take 3 tabs on days 1-2, take 2 tabs on days 3-4, take 1 tab on days 5-6 12 tablet 0   No current facility-administered medications for this visit.     Allergies  Allergen Reactions  . Amoxicillin Nausea And Vomiting  . Cinnamon Swelling and Other (See Comments)    Mouth swelling. Pt states it is a minor allergy     Family History  Problem Relation Age of Onset  . Diabetes Maternal Grandmother   . Diabetes Maternal Grandfather   . Diabetes Paternal Grandmother   . Diabetes Paternal Grandfather     Social History   Socioeconomic History  . Marital status: Married    Spouse name: Not on file  . Number of children: Not on file  . Years of education: Not on file  . Highest education level: Not on file  Occupational History  . Not on file  Social Needs  . Financial resource strain: Not on file  . Food insecurity:    Worry: Not on file    Inability: Not on file  . Transportation needs:    Medical: Not on file   Non-medical: Not on file  Tobacco Use  . Smoking status: Never Smoker  . Smokeless tobacco: Never Used  Substance and Sexual Activity  . Alcohol use: No  . Drug use: No  . Sexual activity: Yes    Comment: last sex 2-3 weeks ago  Lifestyle  . Physical activity:    Days per week: Not on file    Minutes per session: Not on file  . Stress: Not on file  Relationships  . Social connections:    Talks on phone: Not on file    Gets together: Not on file    Attends religious service: Not on file    Active member of club or organization: Not on file    Attends meetings of clubs or organizations: Not on file    Relationship status: Not on file  . Intimate partner violence:    Fear of current or ex partner: Not on file    Emotionally abused: Not on file    Physically abused: Not on file    Forced sexual activity: Not on file  Other Topics Concern  . Not on file  Social History Narrative  . Not on file  Constitutional: Denies fever, malaise, fatigue, headache or abrupt weight changes.   Skin: Pt reports red, itchy rash of bilateral upper thighs, and right side of trunk. Denies  ulcercations.    No other specific complaints in a complete review of systems (except as listed in HPI above).  Objective:   Physical Exam  BP 106/68   Pulse 92   Temp 98.4 F (36.9 C) (Oral)   Wt 122 lb (55.3 kg)   SpO2 98%   BMI 21.61 kg/m  Wt Readings from Last 3 Encounters:  11/03/17 122 lb (55.3 kg)  09/08/17 123 lb (55.8 kg)  03/24/17 124 lb (56.2 kg)    General: Appears their stated age, well developed, well nourished in NAD. Skin: Grouped vesicular lesion noted of bilateral upper thighs, right side of trunk/back.   BMET    Component Value Date/Time   NA 133 (L) 12/04/2016 1612   K 3.4 (L) 12/04/2016 1612   CL 102 12/04/2016 1612   CO2 22 12/04/2016 1612   GLUCOSE 127 (H) 12/04/2016 1612   BUN 12 12/04/2016 1612   CREATININE 0.87 12/04/2016 1612   CALCIUM 9.2 12/04/2016 1612     GFRNONAA >60 12/04/2016 1612   GFRAA >60 12/04/2016 1612    Lipid Panel  No results found for: CHOL, TRIG, HDL, CHOLHDL, VLDL, LDLCALC  CBC    Component Value Date/Time   WBC 12.0 (H) 12/04/2016 1612   RBC 4.40 12/04/2016 1612   HGB 12.3 12/04/2016 1612   HCT 36.4 12/04/2016 1612   PLT 271 12/04/2016 1612   MCV 82.8 12/04/2016 1612   MCH 27.9 12/04/2016 1612   MCHC 33.7 12/04/2016 1612   RDW 13.5 12/04/2016 1612   LYMPHSABS 0.6 (L) 12/04/2016 1612   MONOABS 1.0 (H) 12/04/2016 1612   EOSABS 0.0 12/04/2016 1612   BASOSABS 0.0 12/04/2016 1612    Hgb A1C No results found for: HGBA1C          Assessment & Plan:   Contact Dermatitis d/t Plant:  Continue Calamine as needed Oatmeal baths may be helpful Can take Zyrtec and Zantac to help with itching eRx for Pred Taper x 6 days Avoid scratching as this may lead to infection  Return precautions discussed Nicki Reaperegina Baity, NP

## 2017-11-06 ENCOUNTER — Encounter: Payer: Self-pay | Admitting: Internal Medicine

## 2017-11-14 ENCOUNTER — Encounter: Payer: Self-pay | Admitting: Internal Medicine

## 2017-11-17 MED ORDER — CLOBETASOL PROPIONATE 0.05 % EX CREA: 1.0000 "application " | TOPICAL_CREAM | Freq: Two times a day (BID) | CUTANEOUS | 0 refills | Status: DC

## 2017-11-24 ENCOUNTER — Ambulatory Visit (INDEPENDENT_AMBULATORY_CARE_PROVIDER_SITE_OTHER): Payer: BLUE CROSS/BLUE SHIELD | Admitting: Internal Medicine

## 2017-11-24 ENCOUNTER — Encounter: Payer: Self-pay | Admitting: Internal Medicine

## 2017-11-24 VITALS — BP 106/68 | HR 85 | Temp 97.9°F | Wt 124.0 lb

## 2017-11-24 DIAGNOSIS — R350 Frequency of micturition: Secondary | ICD-10-CM | POA: Diagnosis not present

## 2017-11-24 DIAGNOSIS — R3989 Other symptoms and signs involving the genitourinary system: Secondary | ICD-10-CM | POA: Diagnosis not present

## 2017-11-24 DIAGNOSIS — N898 Other specified noninflammatory disorders of vagina: Secondary | ICD-10-CM | POA: Diagnosis not present

## 2017-11-24 LAB — POC URINALSYSI DIPSTICK (AUTOMATED)
BILIRUBIN UA: NEGATIVE
Blood, UA: NEGATIVE
GLUCOSE UA: NEGATIVE
KETONES UA: NEGATIVE
LEUKOCYTES UA: NEGATIVE
Nitrite, UA: NEGATIVE
PROTEIN UA: NEGATIVE
SPEC GRAV UA: 1.025 (ref 1.010–1.025)
Urobilinogen, UA: 0.2 E.U./dL
pH, UA: 6 (ref 5.0–8.0)

## 2017-11-24 NOTE — Patient Instructions (Signed)

## 2017-11-24 NOTE — Progress Notes (Signed)
HPI  Pt presents to the clinic today with c/o urinary frequency and bladder pressure. She reports this started about 4 days ago. She denise urgency, dysuria or blood in her urine. She denies vaginal discharge, odor or abnormal bleeding. She denies fever, chills, nausea or low back pain. She has tried Ibuprofen and increase in water intake without any relief of symptoms.   Review of Systems  Past Medical History:  Diagnosis Date  . Anemia   . Anxiety   . Asthma    as a child  . Urinary tract infection     Family History  Problem Relation Age of Onset  . Diabetes Maternal Grandmother   . Diabetes Maternal Grandfather   . Diabetes Paternal Grandmother   . Diabetes Paternal Grandfather     Social History   Socioeconomic History  . Marital status: Married    Spouse name: Not on file  . Number of children: Not on file  . Years of education: Not on file  . Highest education level: Not on file  Occupational History  . Not on file  Social Needs  . Financial resource strain: Not on file  . Food insecurity:    Worry: Not on file    Inability: Not on file  . Transportation needs:    Medical: Not on file    Non-medical: Not on file  Tobacco Use  . Smoking status: Never Smoker  . Smokeless tobacco: Never Used  Substance and Sexual Activity  . Alcohol use: No  . Drug use: No  . Sexual activity: Yes    Comment: last sex 2-3 weeks ago  Lifestyle  . Physical activity:    Days per week: Not on file    Minutes per session: Not on file  . Stress: Not on file  Relationships  . Social connections:    Talks on phone: Not on file    Gets together: Not on file    Attends religious service: Not on file    Active member of club or organization: Not on file    Attends meetings of clubs or organizations: Not on file    Relationship status: Not on file  . Intimate partner violence:    Fear of current or ex partner: Not on file    Emotionally abused: Not on file    Physically  abused: Not on file    Forced sexual activity: Not on file  Other Topics Concern  . Not on file  Social History Narrative  . Not on file    Allergies  Allergen Reactions  . Amoxicillin Nausea And Vomiting  . Cinnamon Swelling and Other (See Comments)    Mouth swelling. Pt states it is a minor allergy      Constitutional: Denies fever, malaise, fatigue, headache or abrupt weight changes.   GU: Pt reports frequency, bladder pressure. Denies urgency, dysuria, burning sensation, blood in urine, odor or discharge.    No other specific complaints in a complete review of systems (except as listed in HPI above).    Objective:   Physical Exam  Pulse 85   Temp 97.9 F (36.6 C) (Oral)   Wt 124 lb (56.2 kg)   LMP 10/29/2017   SpO2 98%   BMI 21.97 kg/m  Wt Readings from Last 3 Encounters:  11/24/17 124 lb (56.2 kg)  11/03/17 122 lb (55.3 kg)  09/08/17 123 lb (55.8 kg)    General: Appears her stated age, well developed, well nourished in NAD. Abdomen: Soft. Normal  bowel sounds. No distention or masses noted.  Tender to palpation over the bladder area. No CVA tenderness.        Assessment & Plan:   Frequency, Bladder Pressure:  Urinalysis: normal Will send urine culture Will send off wet prep Will treat only if wet prep or urine culture positive Drink plenty of fluids  RTC as needed or if symptoms persist. Nicki Reaper, NP

## 2017-11-26 LAB — URINE CULTURE
MICRO NUMBER:: 91081809
SPECIMEN QUALITY:: ADEQUATE

## 2017-11-26 LAB — WET PREP BY MOLECULAR PROBE
Candida species: NOT DETECTED
Gardnerella vaginalis: NOT DETECTED
MICRO NUMBER: 91081808
SPECIMEN QUALITY: ADEQUATE
TRICHOMONAS VAG: NOT DETECTED

## 2017-12-15 ENCOUNTER — Ambulatory Visit: Payer: BLUE CROSS/BLUE SHIELD | Admitting: Internal Medicine

## 2017-12-16 DIAGNOSIS — Z6822 Body mass index (BMI) 22.0-22.9, adult: Secondary | ICD-10-CM | POA: Diagnosis not present

## 2017-12-16 DIAGNOSIS — N762 Acute vulvitis: Secondary | ICD-10-CM | POA: Diagnosis not present

## 2018-01-26 DIAGNOSIS — N912 Amenorrhea, unspecified: Secondary | ICD-10-CM | POA: Diagnosis not present

## 2018-02-02 ENCOUNTER — Other Ambulatory Visit: Payer: Self-pay | Admitting: Internal Medicine

## 2018-02-02 NOTE — Telephone Encounter (Signed)
When did you want to see pt, looks she needs CPE last OV acute 11/2017

## 2018-02-02 NOTE — Telephone Encounter (Signed)
Schedule CPE as soon as she is able.

## 2018-02-09 DIAGNOSIS — Z3480 Encounter for supervision of other normal pregnancy, unspecified trimester: Secondary | ICD-10-CM | POA: Diagnosis not present

## 2018-02-09 DIAGNOSIS — Z3490 Encounter for supervision of normal pregnancy, unspecified, unspecified trimester: Secondary | ICD-10-CM | POA: Diagnosis not present

## 2018-02-09 DIAGNOSIS — Z6822 Body mass index (BMI) 22.0-22.9, adult: Secondary | ICD-10-CM | POA: Diagnosis not present

## 2018-02-09 DIAGNOSIS — O26849 Uterine size-date discrepancy, unspecified trimester: Secondary | ICD-10-CM | POA: Diagnosis not present

## 2018-02-09 DIAGNOSIS — Z3481 Encounter for supervision of other normal pregnancy, first trimester: Secondary | ICD-10-CM | POA: Diagnosis not present

## 2018-02-09 DIAGNOSIS — Z124 Encounter for screening for malignant neoplasm of cervix: Secondary | ICD-10-CM | POA: Diagnosis not present

## 2018-02-09 LAB — OB RESULTS CONSOLE ABO/RH: RH Type: POSITIVE

## 2018-02-09 LAB — OB RESULTS CONSOLE HIV ANTIBODY (ROUTINE TESTING): HIV: NONREACTIVE

## 2018-02-09 LAB — OB RESULTS CONSOLE GC/CHLAMYDIA
Chlamydia: NEGATIVE
Gonorrhea: NEGATIVE

## 2018-02-09 LAB — OB RESULTS CONSOLE RPR: RPR: NONREACTIVE

## 2018-02-09 LAB — OB RESULTS CONSOLE ANTIBODY SCREEN: Antibody Screen: NEGATIVE

## 2018-02-09 LAB — HM PAP SMEAR: HM Pap smear: NORMAL

## 2018-02-09 LAB — OB RESULTS CONSOLE HEPATITIS B SURFACE ANTIGEN: Hepatitis B Surface Ag: NEGATIVE

## 2018-02-09 LAB — OB RESULTS CONSOLE RUBELLA ANTIBODY, IGM: Rubella: IMMUNE

## 2018-03-04 DIAGNOSIS — Z3490 Encounter for supervision of normal pregnancy, unspecified, unspecified trimester: Secondary | ICD-10-CM | POA: Insufficient documentation

## 2018-03-17 NOTE — L&D Delivery Note (Signed)
Delivery Note Patient pushed well with two contractions.  At 1:00 PM a viable female was delivered via Vaginal, Spontaneous (Presentation: ROA  ).  APGAR: 7, 8; weight 3065 gm (6lb 12. oz) .   Placenta status: Spontaneous, in tact w 3VC.  Cord pH: n/a  Anesthesia:  Epidural Episiotomy:  None Lacerations:  1st degree perineal Suture Repair: 3.0 vicryl rapide Est. Blood Loss (mL):  250 mL  Mom to postpartum.  Baby to Couplet care / Skin to Skin.  [place] 08/23/2018, 1:18 PM

## 2018-03-22 ENCOUNTER — Observation Stay (HOSPITAL_COMMUNITY)
Admission: AD | Admit: 2018-03-22 | Discharge: 2018-03-23 | Disposition: A | Discharge status: Home / Self Care | Payer: BLUE CROSS/BLUE SHIELD | Attending: Surgery | Admitting: Surgery

## 2018-03-22 ENCOUNTER — Encounter (HOSPITAL_COMMUNITY): Payer: Self-pay | Admitting: *Deleted

## 2018-03-22 ENCOUNTER — Ambulatory Visit (HOSPITAL_COMMUNITY): Payer: BLUE CROSS/BLUE SHIELD | Admitting: Certified Registered Nurse Anesthetist

## 2018-03-22 ENCOUNTER — Other Ambulatory Visit: Payer: Self-pay

## 2018-03-22 ENCOUNTER — Encounter (HOSPITAL_COMMUNITY): Admission: AD | Disposition: A | Discharge status: Home / Self Care | Payer: Self-pay | Source: Home / Self Care

## 2018-03-22 ENCOUNTER — Inpatient Hospital Stay (HOSPITAL_COMMUNITY): Payer: BLUE CROSS/BLUE SHIELD

## 2018-03-22 DIAGNOSIS — R109 Unspecified abdominal pain: Secondary | ICD-10-CM

## 2018-03-22 DIAGNOSIS — O99612 Diseases of the digestive system complicating pregnancy, second trimester: Secondary | ICD-10-CM | POA: Diagnosis not present

## 2018-03-22 DIAGNOSIS — Z880 Allergy status to penicillin: Secondary | ICD-10-CM | POA: Insufficient documentation

## 2018-03-22 DIAGNOSIS — K3533 Acute appendicitis with perforation and localized peritonitis, with abscess: Secondary | ICD-10-CM | POA: Diagnosis not present

## 2018-03-22 DIAGNOSIS — Z3480 Encounter for supervision of other normal pregnancy, unspecified trimester: Secondary | ICD-10-CM | POA: Diagnosis not present

## 2018-03-22 DIAGNOSIS — Z3A16 16 weeks gestation of pregnancy: Secondary | ICD-10-CM | POA: Diagnosis not present

## 2018-03-22 DIAGNOSIS — K3580 Unspecified acute appendicitis: Secondary | ICD-10-CM | POA: Diagnosis present

## 2018-03-22 DIAGNOSIS — O26899 Other specified pregnancy related conditions, unspecified trimester: Secondary | ICD-10-CM | POA: Diagnosis not present

## 2018-03-22 DIAGNOSIS — R102 Pelvic and perineal pain: Secondary | ICD-10-CM | POA: Diagnosis not present

## 2018-03-22 DIAGNOSIS — O99619 Diseases of the digestive system complicating pregnancy, unspecified trimester: Secondary | ICD-10-CM

## 2018-03-22 DIAGNOSIS — Z113 Encounter for screening for infections with a predominantly sexual mode of transmission: Secondary | ICD-10-CM | POA: Diagnosis not present

## 2018-03-22 HISTORY — PX: LAPAROSCOPIC APPENDECTOMY: SHX408

## 2018-03-22 LAB — CBC WITH DIFFERENTIAL/PLATELET
Basophils Absolute: 0 10*3/uL (ref 0.0–0.1)
Basophils Relative: 0 %
EOS PCT: 1 %
Eosinophils Absolute: 0.2 10*3/uL (ref 0.0–0.5)
HCT: 35.4 % — ABNORMAL LOW (ref 36.0–46.0)
Hemoglobin: 11.7 g/dL — ABNORMAL LOW (ref 12.0–15.0)
Lymphocytes Relative: 12 %
Lymphs Abs: 1.5 10*3/uL (ref 0.7–4.0)
MCH: 28.3 pg (ref 26.0–34.0)
MCHC: 33.1 g/dL (ref 30.0–36.0)
MCV: 85.7 fL (ref 80.0–100.0)
Monocytes Absolute: 0.4 10*3/uL (ref 0.1–1.0)
Monocytes Relative: 3 %
Neutro Abs: 11.1 10*3/uL — ABNORMAL HIGH (ref 1.7–7.7)
Neutrophils Relative %: 84 %
PLATELETS: 258 10*3/uL (ref 150–400)
RBC: 4.13 MIL/uL (ref 3.87–5.11)
RDW: 12.8 % (ref 11.5–15.5)
WBC: 13.1 10*3/uL — ABNORMAL HIGH (ref 4.0–10.5)
nRBC: 0 % (ref 0.0–0.2)

## 2018-03-22 LAB — COMPREHENSIVE METABOLIC PANEL
ALT: 16 U/L (ref 0–44)
AST: 24 U/L (ref 15–41)
Albumin: 3.3 g/dL — ABNORMAL LOW (ref 3.5–5.0)
Alkaline Phosphatase: 58 U/L (ref 38–126)
Anion gap: 10 (ref 5–15)
BUN: 7 mg/dL (ref 6–20)
CHLORIDE: 104 mmol/L (ref 98–111)
CO2: 19 mmol/L — ABNORMAL LOW (ref 22–32)
Calcium: 8.6 mg/dL — ABNORMAL LOW (ref 8.9–10.3)
Creatinine, Ser: 0.54 mg/dL (ref 0.44–1.00)
GFR calc Af Amer: >60 mL/min (ref >60)
GFR calc non Af Amer: >60 mL/min (ref >60)
Glucose, Bld: 81 mg/dL (ref 70–99)
Potassium: 3.6 mmol/L (ref 3.5–5.1)
SODIUM: 133 mmol/L — AB (ref 135–145)
Total Bilirubin: 0.5 mg/dL (ref 0.3–1.2)
Total Protein: 7.1 g/dL (ref 6.5–8.1)

## 2018-03-22 LAB — TYPE AND SCREEN
ABO/RH(D): A POS
Antibody Screen: NEGATIVE

## 2018-03-22 SURGERY — APPENDECTOMY, LAPAROSCOPIC
Anesthesia: General | Site: Abdomen

## 2018-03-22 MED ORDER — LACTATED RINGERS IV SOLN
INTRAVENOUS | Status: DC
  Administered 2018-03-22: 13:00:00 via INTRAVENOUS

## 2018-03-22 MED ORDER — ONDANSETRON 4 MG PO TBDP: 4.0000 mg | ORAL_TABLET | Freq: Four times a day (QID) | ORAL | Status: DC | PRN

## 2018-03-22 MED ORDER — ONDANSETRON HCL 4 MG/2ML IJ SOLN: 4.0000 mg | Freq: Four times a day (QID) | INTRAMUSCULAR | Status: DC | PRN

## 2018-03-22 MED ORDER — FENTANYL CITRATE (PF) 100 MCG/2ML IJ SOLN
25.0000 ug | INTRAMUSCULAR | Status: DC | PRN
  Administered 2018-03-22 (×2): 25 ug via INTRAVENOUS

## 2018-03-22 MED ORDER — KETOROLAC TROMETHAMINE 30 MG/ML IJ SOLN: 30.0000 mg | Freq: Once | INTRAMUSCULAR | Status: DC | PRN

## 2018-03-22 MED ORDER — PROMETHAZINE HCL 25 MG/ML IJ SOLN: 6.2500 mg | INTRAMUSCULAR | Status: DC | PRN

## 2018-03-22 MED ORDER — ROCURONIUM BROMIDE 10 MG/ML (PF) SYRINGE
PREFILLED_SYRINGE | INTRAVENOUS | Status: AC
  Filled 2018-03-22: qty 10

## 2018-03-22 MED ORDER — LIDOCAINE 2% (20 MG/ML) 5 ML SYRINGE
INTRAMUSCULAR | Status: AC
  Filled 2018-03-22: qty 5

## 2018-03-22 MED ORDER — TRAMADOL HCL 50 MG PO TABS
50.0000 mg | ORAL_TABLET | Freq: Four times a day (QID) | ORAL | Status: DC | PRN
  Administered 2018-03-22: 50 mg via ORAL
  Filled 2018-03-22: qty 1

## 2018-03-22 MED ORDER — PHENYLEPHRINE 40 MCG/ML (10ML) SYRINGE FOR IV PUSH (FOR BLOOD PRESSURE SUPPORT)
PREFILLED_SYRINGE | INTRAVENOUS | Status: DC | PRN
  Administered 2018-03-22: 80 ug via INTRAVENOUS

## 2018-03-22 MED ORDER — DEXAMETHASONE SODIUM PHOSPHATE 10 MG/ML IJ SOLN
INTRAMUSCULAR | Status: DC | PRN
  Administered 2018-03-22: 8 mg via INTRAVENOUS

## 2018-03-22 MED ORDER — SUGAMMADEX SODIUM 200 MG/2ML IV SOLN
INTRAVENOUS | Status: AC
  Filled 2018-03-22: qty 2

## 2018-03-22 MED ORDER — METRONIDAZOLE IN NACL 5-0.79 MG/ML-% IV SOLN
500.0000 mg | Freq: Three times a day (TID) | INTRAVENOUS | Status: DC
  Administered 2018-03-22: 500 mg via INTRAVENOUS

## 2018-03-22 MED ORDER — FENTANYL CITRATE (PF) 250 MCG/5ML IJ SOLN
INTRAMUSCULAR | Status: AC
  Filled 2018-03-22: qty 5

## 2018-03-22 MED ORDER — METRONIDAZOLE IN NACL 5-0.79 MG/ML-% IV SOLN
INTRAVENOUS | Status: AC
  Filled 2018-03-22: qty 100

## 2018-03-22 MED ORDER — LACTATED RINGERS IV SOLN
INTRAVENOUS | Status: AC | PRN
  Administered 2018-03-22: 1000 mL

## 2018-03-22 MED ORDER — ROCURONIUM BROMIDE 10 MG/ML (PF) SYRINGE
PREFILLED_SYRINGE | INTRAVENOUS | Status: DC | PRN
  Administered 2018-03-22: 30 mg via INTRAVENOUS

## 2018-03-22 MED ORDER — OXYCODONE HCL 5 MG/5ML PO SOLN
5.0000 mg | Freq: Once | ORAL | Status: DC | PRN
  Filled 2018-03-22: qty 5

## 2018-03-22 MED ORDER — LACTATED RINGERS IV SOLN
Freq: Once | INTRAVENOUS | Status: AC
  Administered 2018-03-22: 17:00:00 via INTRAVENOUS

## 2018-03-22 MED ORDER — OXYCODONE HCL 5 MG PO TABS: 5.0000 mg | ORAL_TABLET | Freq: Once | ORAL | Status: DC | PRN

## 2018-03-22 MED ORDER — DEXAMETHASONE SODIUM PHOSPHATE 10 MG/ML IJ SOLN
INTRAMUSCULAR | Status: AC
  Filled 2018-03-22: qty 1

## 2018-03-22 MED ORDER — FENTANYL CITRATE (PF) 100 MCG/2ML IJ SOLN
INTRAMUSCULAR | Status: DC | PRN
  Administered 2018-03-22: 100 ug via INTRAVENOUS
  Administered 2018-03-22 (×2): 50 ug via INTRAVENOUS

## 2018-03-22 MED ORDER — ACETAMINOPHEN 650 MG RE SUPP: 650.0000 mg | Freq: Four times a day (QID) | RECTAL | Status: DC | PRN

## 2018-03-22 MED ORDER — FENTANYL CITRATE (PF) 100 MCG/2ML IJ SOLN
INTRAMUSCULAR | Status: AC
  Filled 2018-03-22: qty 2

## 2018-03-22 MED ORDER — CHLORHEXIDINE GLUCONATE CLOTH 2 % EX PADS: 6.0000 | MEDICATED_PAD | Freq: Once | CUTANEOUS | Status: DC

## 2018-03-22 MED ORDER — SUCCINYLCHOLINE CHLORIDE 200 MG/10ML IV SOSY
PREFILLED_SYRINGE | INTRAVENOUS | Status: DC | PRN
  Administered 2018-03-22: 120 mg via INTRAVENOUS

## 2018-03-22 MED ORDER — ONDANSETRON HCL 4 MG/2ML IJ SOLN
INTRAMUSCULAR | Status: AC
  Filled 2018-03-22: qty 2

## 2018-03-22 MED ORDER — HYDROCODONE-ACETAMINOPHEN 5-325 MG PO TABS
1.0000 | ORAL_TABLET | ORAL | Status: DC | PRN
  Administered 2018-03-22 – 2018-03-23 (×3): 1 via ORAL
  Filled 2018-03-22 (×3): qty 1

## 2018-03-22 MED ORDER — HYDROMORPHONE HCL 1 MG/ML IJ SOLN
1.0000 mg | Freq: Once | INTRAMUSCULAR | Status: AC
  Administered 2018-03-22: 1 mg via INTRAVENOUS
  Filled 2018-03-22: qty 1

## 2018-03-22 MED ORDER — HYDROMORPHONE HCL 1 MG/ML IJ SOLN: 1.0000 mg | INTRAMUSCULAR | Status: DC | PRN

## 2018-03-22 MED ORDER — BUPIVACAINE-EPINEPHRINE 0.25% -1:200000 IJ SOLN
INTRAMUSCULAR | Status: DC | PRN
  Administered 2018-03-22: 16 mL

## 2018-03-22 MED ORDER — ACETAMINOPHEN 325 MG PO TABS: 650.0000 mg | ORAL_TABLET | Freq: Four times a day (QID) | ORAL | Status: DC | PRN

## 2018-03-22 MED ORDER — SUCCINYLCHOLINE CHLORIDE 200 MG/10ML IV SOSY
PREFILLED_SYRINGE | INTRAVENOUS | Status: AC
  Filled 2018-03-22: qty 10

## 2018-03-22 MED ORDER — KCL IN DEXTROSE-NACL 20-5-0.45 MEQ/L-%-% IV SOLN
INTRAVENOUS | Status: DC
  Administered 2018-03-22: 20:00:00 via INTRAVENOUS
  Filled 2018-03-22: qty 1000

## 2018-03-22 MED ORDER — PROPOFOL 10 MG/ML IV BOLUS
INTRAVENOUS | Status: DC | PRN
  Administered 2018-03-22: 150 mg via INTRAVENOUS

## 2018-03-22 MED ORDER — BUPIVACAINE-EPINEPHRINE (PF) 0.25% -1:200000 IJ SOLN
INTRAMUSCULAR | Status: AC
  Filled 2018-03-22: qty 30

## 2018-03-22 MED ORDER — PHENYLEPHRINE 40 MCG/ML (10ML) SYRINGE FOR IV PUSH (FOR BLOOD PRESSURE SUPPORT)
PREFILLED_SYRINGE | INTRAVENOUS | Status: AC
  Filled 2018-03-22: qty 10

## 2018-03-22 MED ORDER — SODIUM CHLORIDE 0.9 % IV SOLN
INTRAVENOUS | Status: AC
  Filled 2018-03-22: qty 20

## 2018-03-22 MED ORDER — FLUOXETINE HCL 20 MG PO CAPS
40.0000 mg | ORAL_CAPSULE | Freq: Every day | ORAL | Status: DC
  Administered 2018-03-22: 40 mg via ORAL
  Filled 2018-03-22: qty 2

## 2018-03-22 MED ORDER — SODIUM CHLORIDE 0.9 % IV SOLN
2.0000 g | INTRAVENOUS | Status: AC
  Administered 2018-03-22: 2 g via INTRAVENOUS

## 2018-03-22 MED ORDER — PROPOFOL 10 MG/ML IV BOLUS
INTRAVENOUS | Status: AC
  Filled 2018-03-22: qty 20

## 2018-03-22 MED ORDER — SUGAMMADEX SODIUM 200 MG/2ML IV SOLN
INTRAVENOUS | Status: DC | PRN
  Administered 2018-03-22: 120 mg via INTRAVENOUS

## 2018-03-22 MED ORDER — 0.9 % SODIUM CHLORIDE (POUR BTL) OPTIME
TOPICAL | Status: DC | PRN
  Administered 2018-03-22: 1000 mL

## 2018-03-22 MED ORDER — BUPROPION HCL ER (XL) 150 MG PO TB24
150.0000 mg | ORAL_TABLET | Freq: Every day | ORAL | Status: DC
  Administered 2018-03-22: 150 mg via ORAL
  Filled 2018-03-22: qty 1

## 2018-03-22 MED ORDER — ONDANSETRON HCL 4 MG/2ML IJ SOLN
INTRAMUSCULAR | Status: DC | PRN
  Administered 2018-03-22: 4 mg via INTRAVENOUS

## 2018-03-22 SURGICAL SUPPLY — 31 items
APPLIER CLIP ROT 10 11.4 M/L (STAPLE)
APR CLP MED LRG 11.4X10 (STAPLE)
BAG SPEC RTRVL LRG 6X4 10 (ENDOMECHANICALS) ×1
CHLORAPREP W/TINT 26ML (MISCELLANEOUS) ×2 IMPLANT
CLIP APPLIE ROT 10 11.4 M/L (STAPLE) IMPLANT
COVER SURGICAL LIGHT HANDLE (MISCELLANEOUS) ×2 IMPLANT
COVER WAND RF STERILE (DRAPES) ×1 IMPLANT
CUTTER FLEX LINEAR 45M (STAPLE) ×1 IMPLANT
DECANTER SPIKE VIAL GLASS SM (MISCELLANEOUS) ×2 IMPLANT
DRAPE LAPAROSCOPIC ABDOMINAL (DRAPES) ×2 IMPLANT
ELECT REM PT RETURN 15FT ADLT (MISCELLANEOUS) ×2 IMPLANT
ENDOLOOP SUT PDS II  0 18 (SUTURE)
ENDOLOOP SUT PDS II 0 18 (SUTURE) IMPLANT
GLOVE SURG ORTHO 8.0 STRL STRW (GLOVE) ×2 IMPLANT
GOWN STRL REUS W/TWL XL LVL3 (GOWN DISPOSABLE) ×4 IMPLANT
KIT BASIN OR (CUSTOM PROCEDURE TRAY) ×2 IMPLANT
POUCH SPECIMEN RETRIEVAL 10MM (ENDOMECHANICALS) ×2 IMPLANT
RELOAD 45 VASCULAR/THIN (ENDOMECHANICALS) IMPLANT
RELOAD STAPLE 45 2.5 WHT GRN (ENDOMECHANICALS) IMPLANT
RELOAD STAPLE 45 3.5 BLU ETS (ENDOMECHANICALS) IMPLANT
RELOAD STAPLE TA45 3.5 REG BLU (ENDOMECHANICALS) ×2 IMPLANT
SET IRRIG TUBING LAPAROSCOPIC (IRRIGATION / IRRIGATOR) ×2 IMPLANT
SHEARS HARMONIC ACE PLUS 36CM (ENDOMECHANICALS) ×2 IMPLANT
STRIP CLOSURE SKIN 1/2X4 (GAUZE/BANDAGES/DRESSINGS) ×2 IMPLANT
SUT MNCRL AB 4-0 PS2 18 (SUTURE) ×2 IMPLANT
TOWEL OR 17X26 10 PK STRL BLUE (TOWEL DISPOSABLE) ×2 IMPLANT
TOWEL OR NON WOVEN STRL DISP B (DISPOSABLE) ×2 IMPLANT
TRAY FOLEY MTR SLVR 14FR STAT (SET/KITS/TRAYS/PACK) ×1 IMPLANT
TRAY LAPAROSCOPIC (CUSTOM PROCEDURE TRAY) ×2 IMPLANT
TROCAR XCEL BLUNT TIP 100MML (ENDOMECHANICALS) ×2 IMPLANT
TROCAR XCEL NON-BLD 11X100MML (ENDOMECHANICALS) ×2 IMPLANT

## 2018-03-22 NOTE — Transfer of Care (Signed)
Immediate Anesthesia Transfer of Care Note  Patient: Lauren Griffith  Procedure(s) Performed: Procedure(s): APPENDECTOMY LAPAROSCOPIC (N/A)  Patient Location: PACU  Anesthesia Type:General  Level of Consciousness:  sedated, patient cooperative and responds to stimulation  Airway & Oxygen Therapy:Patient Spontanous Breathing and Patient connected to face mask oxgen  Post-op Assessment:  Report given to PACU RN and Post -op Vital signs reviewed and stable  Post vital signs:  Reviewed and stable  Last Vitals:  Vitals:   03/22/18 1227 03/22/18 1812  BP: 117/72   Pulse: (!) 104   Resp: 18   Temp: 36.5 C (P) 36.9 C    Complications: No apparent anesthesia complications

## 2018-03-22 NOTE — MAU Note (Signed)
Patient woke up early this AM with lower abdominal "period cramps" that have since turned in to constant right lower abdominal pain.  Pt also c/o nausea.  Denies diarrhea.  States last BM was yesterday.  Tried taking tylenol this AM for pain,but did not help.  Afebrile.

## 2018-03-22 NOTE — MAU Provider Note (Signed)
History     CSN: [id number]  Arrival date and time: 03/22/18 1215   None     Chief Complaint  Patient presents with  . Abdominal Pain   Lauren Griffith is a 27y.o. [id number] at 16.5wks who presents from the office for R/O Appendicitis.  Patient reports that she awoke at 0600 with right side pain that started as cramping and is now a "sharp pain" that is constant.  Patient states that the pain is worsened with standing and she had one incident of vomiting upon arrival to the MAU.  Patient states she took tylenol around 0700, but received no relief from the pain.  Patient denies fever and nausea. Patient denies fetal movement, but reports flutters.  Patient denies vaginal bleeding or discharge that is of concern.  Patient reports that she was told by her provider that she would need a MRI. Patient states that she last ate around 0200 and had a poptart. She reports drinking a coke (0600) and water.       OB History    Gravida  5   Para  3   Term  3   Preterm      AB  1   Living  3     SAB      TAB  1   Ectopic      Multiple      Live Births  3           Past Medical History:  Diagnosis Date  . Anemia   . Anxiety   . Asthma    as a child  . Urinary tract infection     Past Surgical History:  Procedure Laterality Date  . DILATION AND CURETTAGE OF UTERUS      Family History  Problem Relation Age of Onset  . Diabetes Maternal Grandmother   . Diabetes Maternal Grandfather   . Diabetes Paternal Grandmother   . Diabetes Paternal Grandfather     Social History   Tobacco Use  . Smoking status: Never Smoker  . Smokeless tobacco: Never Used  Substance Use Topics  . Alcohol use: No  . Drug use: No    Allergies:  Allergies  Allergen Reactions  . Amoxicillin Nausea And Vomiting  . Cinnamon Swelling and Other (See Comments)    Mouth swelling. Pt states it is a minor allergy     Medications Prior to Admission  Medication Sig Dispense Refill Last  Dose  . buPROPion (WELLBUTRIN XL) 150 MG 24 hr tablet TAKE 1 TABLET BY MOUTH EVERY DAY 90 tablet 0   . clobetasol cream (TEMOVATE) 0.05 % Apply 1 application topically 2 (two) times daily. 30 g 0 Taking  . FLUoxetine (PROZAC) 40 MG capsule TAKE 1 CAPSULE BY MOUTH EVERY DAY 90 capsule 0     Review of Systems  Constitutional: Negative for chills and fever.  Gastrointestinal: Positive for abdominal pain (Right Side) and vomiting. Negative for constipation, diarrhea and nausea.  Genitourinary: Negative for dysuria, pelvic pain, vaginal bleeding, vaginal discharge and vaginal pain.  Neurological: Positive for dizziness (Upon arrival to MAU). Negative for light-headedness and headaches.   Physical Exam   Blood pressure 117/72, pulse (!) 104, temperature 97.7 F (36.5 C), temperature source Oral, resp. rate 18, height 5\' 3"  (1.6 m), weight 59.6 kg.  Physical Exam  Constitutional: She is oriented to person, place, and time. She appears well-developed and well-nourished. She appears distressed.  HENT:  Head: Normocephalic and atraumatic.  Eyes: Conjunctivae are  normal.  Neck: Normal range of motion.  Cardiovascular: Normal rate, regular rhythm and normal heart sounds.  Respiratory: Effort normal and breath sounds normal.  GI: Soft. Bowel sounds are normal. She exhibits no mass. There is abdominal tenderness. There is guarding. There is no rebound.  Gravid, Soft  Musculoskeletal: Normal range of motion.  Neurological: She is oriented to person, place, and time.  Skin: Skin is warm and dry.  Psychiatric: She has a normal mood and affect. Her behavior is normal.   Results for orders placed or performed during the hospital encounter of 03/22/18 (from the past 24 hour(s))  Type and screen     Status: None   Collection Time: 03/22/18  1:15 PM  Result Value Ref Range   ABO/RH(D) A POS    Antibody Screen NEG    Sample Expiration      03/25/2018 Performed at [facility], [address] [place], [place] [address]   CBC with Differential/Platelet     Status: Abnormal   Collection Time: 03/22/18  1:15 PM  Result Value Ref Range   WBC 13.1 (H) 4.0 - 10.5 K/uL   RBC 4.13 3.87 - 5.11 MIL/uL   Hemoglobin 11.7 (L) 12.0 - 15.0 g/dL   HCT 64.8 (L) 47.2 - 07.2 %   MCV 85.7 80.0 - 100.0 fL   MCH 28.3 26.0 - 34.0 pg   MCHC 33.1 30.0 - 36.0 g/dL   RDW 18.2 88.3 - 37.4 %   Platelets 258 150 - 400 K/uL   nRBC 0.0 0.0 - 0.2 %   Neutrophils Relative % 84 %   Neutro Abs 11.1 (H) 1.7 - 7.7 K/uL   Lymphocytes Relative 12 %   Lymphs Abs 1.5 0.7 - 4.0 K/uL   Monocytes Relative 3 %   Monocytes Absolute 0.4 0.1 - 1.0 K/uL   Eosinophils Relative 1 %   Eosinophils Absolute 0.2 0.0 - 0.5 K/uL   Basophils Relative 0 %   Basophils Absolute 0.0 0.0 - 0.1 K/uL  Comprehensive metabolic panel     Status: Abnormal   Collection Time: 03/22/18  1:15 PM  Result Value Ref Range   Sodium 133 (L) 135 - 145 mmol/L   Potassium 3.6 3.5 - 5.1 mmol/L   Chloride 104 98 - 111 mmol/L   CO2 19 (L) 22 - 32 mmol/L   Glucose, Bld 81 70 - 99 mg/dL   BUN 7 6 - 20 mg/dL   Creatinine, Ser 4.51 0.44 - 1.00 mg/dL   Calcium 8.6 (L) 8.9 - 10.3 mg/dL   Total Protein 7.1 6.5 - 8.1 g/dL   Albumin 3.3 (L) 3.5 - 5.0 g/dL   AST 24 15 - 41 U/L   ALT 16 0 - 44 U/L   Alkaline Phosphatase 58 38 - 126 U/L   Total Bilirubin 0.5 0.3 - 1.2 mg/dL   GFR calc non Af Amer >60 >60 mL/min   GFR calc Af Amer >60 >60 mL/min   Anion gap 10 5 - 15   [place] Results: FINDINGS: A single living intrauterine fetus is noted, which was not evaluated on this exam.  Both ovaries are normal in appearance.  An enlarged appendix is seen in the right lower quadrant which is noncompressible. This measures 12 mm in thickness and shows diffuse wall thickening, consistent with acute appendicitis. No abnormal fluid collection identified.  IMPRESSION: Positive for acute appendicitis.  Normal appearance of both ovaries.  MAU Course   Procedures  MDM Pelvic US to  R/O Torsion Start IV LR Infusion  Labs: CBC with Diff, CMP,  T&S Pain Mgmt: Dilaudid  Assessment and Plan  IUP at 16.5wks Right Side Abdominal Pain  Consult with Dr. Karolee Ohs who suggests to perform pelvic US to RO Torsion and then send for MRI. Start IV and obtain labs Give IV Dilaudid 1 mg for pain management. Patient informed of POC and is agreeable.  Follow Up (3:03 PM) Acute Appendicitis  -[place] returns significant for Acute Appendicitis -Dr. Earlene Plater consulted and advised to call [facility] and transfer per ER attending recommendation. -[place] suggest calling [facility] who suggests [facility] -[facility] On-Call contacted Aldona Bar, PA and agrees to accept patient under the care of [facility]-Todd Linus Galas, MD *Plan to send to [facility] short stay via [facility] -Patient informed of US findings and POC including transfer to [facility]  -Q/C addressed including plan for evaluation and likely surgery today -Nurse informed and transfer initiated.   Follow Up (4:13 PM) -[facility] on unit preparing for transport.   Cherre Robins MSN, CNM 03/22/2018, 12:48 PM

## 2018-03-22 NOTE — Op Note (Signed)
OPERATIVE REPORT - LAPAROSCOPIC APPENDECTOMY  Preop diagnosis:  Acute appendicitis  Postop diagnosis:  same  Procedure:  Laparoscopic appendectomy  Surgeon:  Darnell Level, MD  Anesthesia:  general endotracheal  Estimated blood loss:  minimal  Preparation:  Chlora-prep  Complications:  none  Indications:  Patient is a 28 year old white female transferred from maternity admissions at [facility] to [facility] for surgical management of acute appendicitis.  Patient awoke from sleep early this morning with right lower quadrant abdominal pain.  This was severe.  She contacted her obstetrician and was referred to [facility].  White blood cell count was elevated at 13,000.  Ultrasound examination demonstrated findings consistent with acute appendicitis.  Patient is transported to [facility] for surgical management.  Procedure:  Patient is brought to the operating room and placed in a supine position on the operating room table. Following administration of general anesthesia, a time out was held and the patient's name and procedure is confirmed. Patient is then prepped and draped in the usual strict aseptic fashion.  After ascertaining that an adequate level of anesthesia has been achieved, a peri-umbilical incision is made with a #15 blade. Dissection is carried down to the fascia. Fascia is incised in the midline and the peritoneal cavity is entered cautiously. A #0-vicryl pursestring suture is placed in the fascia. An Hassan cannula is introduced under direct vision and secured with the pursestring suture. The abdomen is insufflated with carbon dioxide. The laparoscope is introduced and the abdomen is explored. Operative ports are placed in the right upper quadrant and left lower quadrant. The appendix is identified. The mesoappendix is divided with the harmonic scalpel. Dissection is carried down to the base of the appendix. The base of the appendix is dissected out  clearing the junction with the cecal wall. Using an Endo-GIA stapler, the base of the appendix is transected at the junction with the cecal wall. There is good approximation of tissue along the staple line. There is good hemostasis along the staple line. The appendix is placed into an endo-catch bag and withdrawn through the umbilical port. The #0-vicryl pursestring suture is tied securely.  Right lower quadrant is irrigated with warm saline which is evacuated. Good hemostasis is noted. Ports are removed under direct vision. Good hemostasis is noted at the port sites. Pneumoperitoneum is released.  Skin incisions are anesthetized with local anesthetic. Wounds are closed with interrupted 4-0 Monocryl subcuticular sutures. Wounds are washed and dried and Dermabond was applied. The patient is awakened from anesthesia and brought to the recovery room. The patient tolerated the procedure well.  Darnell Level, MD [facility] Office: [phone]

## 2018-03-22 NOTE — Progress Notes (Signed)
Gerrit Heck, CNM spoke with Barnetta Chapel, PA-C regarding the accepting physician at [facility] for surgery.  Darnell Level is accepting and Maisie Fus with [facility] was notified for transport.  Will send [facility] for patient to be transported.

## 2018-03-22 NOTE — Progress Notes (Signed)
Pt arrived to 1522 via stretcher from PACU. Alert and in no distress. Lina Sar, RN

## 2018-03-22 NOTE — Anesthesia Procedure Notes (Addendum)
Procedure Name: Intubation Date/Time: 03/22/2018 5:15 PM Performed by: Anne Fu, CRNA Pre-anesthesia Checklist: Patient identified, Emergency Drugs available, Suction available, Patient being monitored and Timeout performed Patient Re-evaluated:Patient Re-evaluated prior to induction Oxygen Delivery Method: Circle system utilized Preoxygenation: Pre-oxygenation with 100% oxygen Induction Type: IV induction, Rapid sequence and Cricoid Pressure applied Laryngoscope Size: Mac and 4 Grade View: Grade I Tube type: Oral Tube size: 7.5 mm Number of attempts: 1 Airway Equipment and Method: Stylet Placement Confirmation: ETT inserted through vocal cords under direct vision,  positive ETCO2 and breath sounds checked- equal and bilateral Secured at: 19 cm Tube secured with: Tape Dental Injury: Teeth and Oropharynx as per pre-operative assessment

## 2018-03-22 NOTE — Anesthesia Preprocedure Evaluation (Signed)
Anesthesia Evaluation  Patient identified by MRN, date of birth, ID band Patient awake    Reviewed: Allergy & Precautions, NPO status , Patient's Chart, lab work & pertinent test results  Airway Mallampati: II  TM Distance: >3 FB Neck ROM: Full    Dental no notable dental hx.    Pulmonary neg pulmonary ROS,    Pulmonary exam normal breath sounds clear to auscultation       Cardiovascular negative cardio ROS Normal cardiovascular exam Rhythm:Regular Rate:Normal     Neuro/Psych Anxiety negative neurological ROS     GI/Hepatic negative GI ROS, Neg liver ROS,   Endo/Other  negative endocrine ROS  Renal/GU negative Renal ROS  negative genitourinary   Musculoskeletal negative musculoskeletal ROS (+)   Abdominal   Peds negative pediatric ROS (+)  Hematology negative hematology ROS (+)   Anesthesia Other Findings   Reproductive/Obstetrics negative OB ROS                             Anesthesia Physical Anesthesia Plan  ASA: II  Anesthesia Plan: General   Post-op Pain Management:    Induction: Intravenous and Rapid sequence  PONV Risk Score and Plan: 3 and Ondansetron, Dexamethasone, Treatment may vary due to age or medical condition and Midazolam  Airway Management Planned: Oral ETT  Additional Equipment:   Intra-op Plan:   Post-operative Plan: Extubation in OR  Informed Consent: I have reviewed the patients History and Physical, chart, labs and discussed the procedure including the risks, benefits and alternatives for the proposed anesthesia with the patient or authorized representative who has indicated his/her understanding and acceptance.   Dental advisory given  Plan Discussed with: CRNA and Surgeon  Anesthesia Plan Comments:         Anesthesia Quick Evaluation

## 2018-03-22 NOTE — H&P (Signed)
Lauren Griffith is an 28 y.o. female.    General Surgery [facility]  Chief Complaint: acute appendicitis  HPI: Patient is a 28 year old white female transferred from maternity admissions at [facility] to [facility] for surgical management of acute appendicitis.  Patient awoke from sleep early this morning with right lower quadrant abdominal pain.  This was severe.  She contacted her obstetrician and was referred to [facility].  White blood cell count was elevated at 13,000.  Ultrasound examination demonstrated findings consistent with acute appendicitis.  Patient is transported to [facility] for surgical management.  This is the patient's pregnancy.  She is approximately 16 weeks estimated gestational age.  There have been no complications.  Patient has had no prior abdominal surgery.  Past Medical History:  Diagnosis Date  . Anemia   . Anxiety   . Asthma    as a child  . Urinary tract infection     Past Surgical History:  Procedure Laterality Date  . DILATION AND CURETTAGE OF UTERUS      Family History  Problem Relation Age of Onset  . Diabetes Maternal Grandmother   . Diabetes Maternal Grandfather   . Diabetes Paternal Grandmother   . Diabetes Paternal Grandfather    Social History:  reports that she has never smoked. She has never used smokeless tobacco. She reports that she does not drink alcohol or use drugs.  Allergies:  Allergies  Allergen Reactions  . Amoxicillin Nausea And Vomiting  . Cinnamon Swelling and Other (See Comments)    Mouth swelling. Pt states it is a minor allergy     Medications Prior to Admission  Medication Sig Dispense Refill  . buPROPion (WELLBUTRIN XL) 150 MG 24 hr tablet TAKE 1 TABLET BY MOUTH EVERY DAY 90 tablet 0  . FLUoxetine (PROZAC) 40 MG capsule TAKE 1 CAPSULE BY MOUTH EVERY DAY 90 capsule 0  . clobetasol cream (TEMOVATE) 0.05 % Apply 1 application topically 2 (two) times daily.  (Patient not taking: Reported on 03/22/2018) 30 g 0    Results for orders placed or performed during the hospital encounter of 03/22/18 (from the past 48 hour(s))  Type and screen     Status: None   Collection Time: 03/22/18  1:15 PM  Result Value Ref Range   ABO/RH(D) A POS    Antibody Screen NEG    Sample Expiration      03/25/2018 Performed at [facility], [address], [place], [place] [address]   CBC with Differential/Platelet     Status: Abnormal   Collection Time: 03/22/18  1:15 PM  Result Value Ref Range   WBC 13.1 (H) 4.0 - 10.5 K/uL   RBC 4.13 3.87 - 5.11 MIL/uL   Hemoglobin 11.7 (L) 12.0 - 15.0 g/dL   HCT 13.0 (L) 86.5 - 78.4 %   MCV 85.7 80.0 - 100.0 fL   MCH 28.3 26.0 - 34.0 pg   MCHC 33.1 30.0 - 36.0 g/dL   RDW 69.6 29.5 - 28.4 %   Platelets 258 150 - 400 K/uL   nRBC 0.0 0.0 - 0.2 %   Neutrophils Relative % 84 %   Neutro Abs 11.1 (H) 1.7 - 7.7 K/uL   Lymphocytes Relative 12 %   Lymphs Abs 1.5 0.7 - 4.0 K/uL   Monocytes Relative 3 %   Monocytes Absolute 0.4 0.1 - 1.0 K/uL   Eosinophils Relative 1 %   Eosinophils Absolute 0.2 0.0 - 0.5 K/uL  Basophils Relative 0 %   Basophils Absolute 0.0 0.0 - 0.1 K/uL    Comment: Performed at [facility], [address], [place], [place] [address]  Comprehensive metabolic panel     Status: Abnormal   Collection Time: 03/22/18  1:15 PM  Result Value Ref Range   Sodium 133 (L) 135 - 145 mmol/L   Potassium 3.6 3.5 - 5.1 mmol/L   Chloride 104 98 - 111 mmol/L   CO2 19 (L) 22 - 32 mmol/L   Glucose, Bld 81 70 - 99 mg/dL   BUN 7 6 - 20 mg/dL   Creatinine, Ser 7.41 0.44 - 1.00 mg/dL   Calcium 8.6 (L) 8.9 - 10.3 mg/dL   Total Protein 7.1 6.5 - 8.1 g/dL   Albumin 3.3 (L) 3.5 - 5.0 g/dL   AST 24 15 - 41 U/L   ALT 16 0 - 44 U/L   Alkaline Phosphatase 58 38 - 126 U/L   Total Bilirubin 0.5 0.3 - 1.2 mg/dL   GFR calc non Af Amer >60 >60 mL/min   GFR calc Af Amer >60 >60 mL/min   Anion gap 10 5 - 15    Comment:  Performed at [facility], [address] [place], [place] [address]   US Pelvis Limited  Result Date: 03/22/2018 CLINICAL DATA:  [redacted]weeks pregnant. Severe right pelvic pain. Evaluate ovaries only. EXAM: LIMITED ULTRASOUND OF PELVIS TECHNIQUE: Limited transabdominal ultrasound examination of the pelvis was performed. COMPARISON:  None. FINDINGS: A single living intrauterine fetus is noted, which was not evaluated on this exam. Both ovaries are normal in appearance. An enlarged appendix is seen in the right lower quadrant which is noncompressible. This measures 12 mm in thickness and shows diffuse wall thickening, consistent with acute appendicitis. No abnormal fluid collection identified. IMPRESSION: Positive for acute appendicitis. Normal appearance of both ovaries. Electronically Signed   By: Myles Rosenthal M.D.   On: 03/22/2018 14:35    Review of Systems  Constitutional: Positive for chills and diaphoresis. Negative for fever.  HENT: Negative.   Eyes: Negative.   Respiratory: Negative.   Cardiovascular: Negative.   Gastrointestinal: Positive for abdominal pain, nausea and vomiting.  Genitourinary: Negative.   Musculoskeletal: Negative.   Skin: Negative.   Neurological: Negative.   Endo/Heme/Allergies: Negative.   Psychiatric/Behavioral: Negative.     Blood pressure 117/72, pulse (!) 104, temperature 97.7 F (36.5 C), temperature source Oral, resp. rate 18, height 5\' 3"  (1.6 m), weight 59.6 kg. Physical Exam  Constitutional: She is oriented to person, place, and time. She appears well-developed and well-nourished. No distress.  HENT:  Head: Normocephalic and atraumatic.  Right Ear: External ear normal.  Left Ear: External ear normal.  Mouth/Throat: No oropharyngeal exudate.  Eyes: Pupils are equal, round, and reactive to light. Conjunctivae are normal. No scleral icterus.  Neck: Normal range of motion. Neck supple. No tracheal deviation present. No thyromegaly present.   Cardiovascular: Regular rhythm and normal heart sounds.  No murmur heard. Mild tachycardia  Respiratory: Effort normal and breath sounds normal. No respiratory distress. She has no wheezes.  GI: Soft. Bowel sounds are normal. She exhibits distension. She exhibits no mass. There is abdominal tenderness (RLQ). There is guarding.  Musculoskeletal: Normal range of motion.        General: No deformity or edema.  Neurological: She is alert and oriented to person, place, and time.  Skin: Skin is warm and dry. She is not diaphoretic.  Psychiatric: She has a normal mood and  affect. Her behavior is normal.     Assessment/Plan Acute appendicitis Intra-uterine pregnancy, EGA [redacted] weeks  I discussed the objective findings with patient.  We discussed surgical management of acute appendicitis.  We discussed potential complications related to her pregnancy including the potential for precipitating spontaneous abortion.  Discussed alternative means of management which would not be recommended in this circumstance.  We discussed laparoscopic appendectomy.  We discussed the potential for conversion to open surgery.  We discussed the hospital stay to be anticipated.  The patient understands and wishes to proceed.  The risks and benefits of the procedure have been discussed at length with the patient.  The patient understands the proposed procedure, potential alternative treatments, and the course of recovery to be expected.  All of the patient's questions have been answered at this time.  The patient wishes to proceed with surgery.  Darnell Levelodd Kyllie Pettijohn, MD [facility] Office: [phone]    Velora HecklerGERKIN,Kelsea Mousel M, MD 03/22/2018, 4:48 PM

## 2018-03-22 NOTE — Anesthesia Postprocedure Evaluation (Signed)
Anesthesia Post Note  Patient: Lauren Griffith  Procedure(s) Performed: APPENDECTOMY LAPAROSCOPIC (N/A Abdomen)     Patient location during evaluation: PACU Anesthesia Type: General Level of consciousness: awake and alert Pain management: pain level controlled Vital Signs Assessment: post-procedure vital signs reviewed and stable Respiratory status: spontaneous breathing, nonlabored ventilation, respiratory function stable and patient connected to nasal cannula oxygen Cardiovascular status: blood pressure returned to baseline and stable Postop Assessment: no apparent nausea or vomiting Anesthetic complications: no    Last Vitals:  Vitals:   03/22/18 1830 03/22/18 1845  BP: 107/68 100/69  Pulse: 100 91  Resp: 15 (!) 28  Temp:    SpO2: 97% 98%    Last Pain:  Vitals:   03/22/18 1845  TempSrc:   PainSc: 7                  Zowie Lundahl S

## 2018-03-22 NOTE — Progress Notes (Signed)
Fetal heart tones assessed, 135-140.

## 2018-03-23 ENCOUNTER — Encounter (HOSPITAL_COMMUNITY): Payer: Self-pay | Admitting: Surgery

## 2018-03-23 MED ORDER — HYDROCODONE-ACETAMINOPHEN 5-325 MG PO TABS: 1.0000 | ORAL_TABLET | ORAL | 0 refills | Status: DC | PRN

## 2018-03-23 NOTE — Progress Notes (Signed)
Pt offered to ambulate in  hall way. Refused this time.Patient said that"I will do later". No acute distress. Will continue to monitor. Pt's diet advanced to full liquids. No c/o nausea and vomiting all night. Pt had some chicken broth ,two cups of apple juice and ice water all  Night. Pt also said that  She feels fetal movement a little bit, same like before surgery.

## 2018-03-23 NOTE — Discharge Instructions (Signed)
[facility] ° °Please arrive at least 30 min before your appointment to complete your check in paperwork.  If you are unable to arrive 30 min prior to your appointment time we may have to cancel or reschedule you. °LAPAROSCOPIC SURGERY: POST OP INSTRUCTIONS °Always review your discharge instruction sheet given to you by the facility where your surgery was performed. °IF YOU HAVE DISABILITY OR FAMILY LEAVE FORMS, YOU MUST BRING THEM TO THE OFFICE FOR PROCESSING.   °DO NOT GIVE THEM TO YOUR DOCTOR. ° °PAIN CONTROL ° °1. First take acetaminophen (Tylenol) AND/or ibuprofen (Advil) to control your pain after surgery.  Follow directions on package.  Taking acetaminophen (Tylenol) and/or ibuprofen (Advil) regularly after surgery will help to control your pain and lower the amount of prescription pain medication you may need.  You should not take more than 4,000 mg (4 grams) of acetaminophen (Tylenol) in 24 hours.  You should not take ibuprofen (Advil), aleve, motrin, naprosyn or other NSAIDS if you have a history of stomach ulcers or chronic kidney disease.  °2. A prescription for pain medication may be given to you upon discharge.  Take your pain medication as prescribed, if you still have uncontrolled pain after taking acetaminophen (Tylenol) or ibuprofen (Advil). °3. Use ice packs to help control pain. °4. If you need a refill on your pain medication, please contact your pharmacy.  They will contact our office to request authorization. Prescriptions will not be filled after 5pm or on week-ends. ° °HOME MEDICATIONS °5. Take your usually prescribed medications unless otherwise directed. ° °DIET °6. You should follow a light diet the first few days after arrival home.  Be sure to include lots of fluids daily. Avoid fatty, fried foods.  ° °CONSTIPATION °7. It is common to experience some constipation after surgery and if you are taking pain medication.  Increasing fluid intake and taking a stool  softener (such as Colace) will usually help or prevent this problem from occurring.  A mild laxative (Milk of Magnesia or Miralax) should be taken according to package instructions if there are no bowel movements after 48 hours. ° °WOUND/INCISION CARE °8. Most patients will experience some swelling and bruising in the area of the incisions.  Ice packs will help.  Swelling and bruising can take several days to resolve.  °9. Unless discharge instructions indicate otherwise, follow guidelines below  °a. STERI-STRIPS - you may remove your outer bandages 48 hours after surgery, and you may shower at that time.  You have steri-strips (small skin tapes) in place directly over the incision.  These strips should be left on the skin for 7-10 days.   °b. DERMABOND/SKIN GLUE - you may shower in 24 hours.  The glue will flake off over the next 2-3 weeks. °10. Any sutures or staples will be removed at the office during your follow-up visit. ° °ACTIVITIES °11. You may resume regular (light) daily activities beginning the next day--such as daily self-care, walking, climbing stairs--gradually increasing activities as tolerated.  You may have sexual intercourse when it is comfortable.  Refrain from any heavy lifting or straining until approved by your doctor. °a. You may drive when you are no longer taking prescription pain medication, you can comfortably wear a seatbelt, and you can safely maneuver your car and apply brakes. ° °FOLLOW-UP °12. You should see your doctor in the office for a follow-up appointment approximately 2-3 weeks after your surgery.  You should have been given your post-op/follow-up appointment when   your surgery was scheduled.  If you did not receive a post-op/follow-up appointment, make sure that you call for this appointment within a day or two after you arrive home to insure a convenient appointment time. ° ° °WHEN TO CALL YOUR DOCTOR: °1. Fever over 101.0 °2. Inability to urinate °3. Continued bleeding from  incision. °4. Increased pain, redness, or drainage from the incision. °5. Increasing abdominal pain ° °The clinic staff is available to answer your questions during regular business hours.  Please don’t hesitate to call and ask to speak to one of the nurses for clinical concerns.  If you have a medical emergency, go to the nearest emergency room or call 911.  A surgeon from [facility] is always on call at the hospital. °[address], [address], [place], [place]  [address] ? [address] [address], , [place]   [address] °[phone] ? [phone] ? FAX [phone] ° °

## 2018-03-23 NOTE — Discharge Summary (Addendum)
     Patient ID: Lauren Griffith [id number] 08/19/1990 28 y.o.  Admit date: 03/22/2018 Discharge date: 03/23/2018  Admitting Diagnosis: Acute appendicitis Gravid, 16 weeks  Discharge Diagnosis Patient Active Problem List   Diagnosis Date Noted  . Acute appendicitis 03/22/2018  . Anxiety and depression 12/09/2016    Consultants none  Reason for Admission: Patient is a 28 year old white female transferred from maternity admissions at [facility] to [facility] for surgical management of acute appendicitis.  Patient awoke from sleep early this morning with right lower quadrant abdominal pain.  This was severe.  She contacted her obstetrician and was referred to [facility].  White blood cell count was elevated at 13,000.  Ultrasound examination demonstrated findings consistent with acute appendicitis.  Patient is transported to [facility] for surgical management.  This is the patient's pregnancy.  She is approximately 16 weeks estimated gestational age.  There have been no complications.  Patient has had no prior abdominal surgery.  Procedures Lap appy, Dr. Gerrit Friends 03/22/18  Hospital Course:  The patient was admitted and underwent a laparoscopic appendectomy.  The patient tolerated the procedure well.  On POD 1, the patient was tolerating a regular diet, voiding well, mobilizing, and pain was controlled with oral pain medications.  The patient was stable for DC home at this time with appropriate follow up made. No issues regarding her pregnancy were noted.    Physical Exam: Abd: soft, gravid, appropriately tender, incisions c/d/i  Allergies as of 03/23/2018      Reactions   Amoxicillin Nausea And Vomiting   Cinnamon Swelling, Other (See Comments)   Mouth swelling. Pt states it is a minor allergy       Medication List    STOP taking these medications   clobetasol cream 0.05 % Commonly known as:  TEMOVATE     TAKE these medications   buPROPion 150  MG 24 hr tablet Commonly known as:  WELLBUTRIN XL TAKE 1 TABLET BY MOUTH EVERY DAY   FLUoxetine 40 MG capsule Commonly known as:  PROZAC TAKE 1 CAPSULE BY MOUTH EVERY DAY   HYDROcodone-acetaminophen 5-325 MG tablet Commonly known as:  NORCO/VICODIN Take 1 tablet by mouth every 4 (four) hours as needed for moderate pain.        Follow-up Information    [facility] .   Specialty:  Radiology Contact information: [address] [id number] mc [place] [address] [phone]       [facility] [place] Follow up on 04/13/2018.   Specialty:  General Surgery Why:  8:30am.  please arrive 30 minutes prior to appointment time for check in and paperwork Contact information: [address] [place] [address] [phone]           Signed: Barnetta Chapel, [facility] 03/23/2018, 11:27 AM Pager: [phone]

## 2018-03-23 NOTE — Progress Notes (Signed)
Pt was discharged home today. Instructions were reviewed with patient, and questions were answered. Pt was taken to main entrance via wheelchair by NT.  

## 2018-03-29 DIAGNOSIS — O3680X9 Pregnancy with inconclusive fetal viability, other fetus: Secondary | ICD-10-CM | POA: Diagnosis not present

## 2018-03-29 DIAGNOSIS — B373 Candidiasis of vulva and vagina: Secondary | ICD-10-CM | POA: Diagnosis not present

## 2018-04-12 DIAGNOSIS — Z3683 Encounter for fetal screening for congenital cardiac abnormalities: Secondary | ICD-10-CM | POA: Diagnosis not present

## 2018-04-12 DIAGNOSIS — N762 Acute vulvitis: Secondary | ICD-10-CM | POA: Diagnosis not present

## 2018-05-19 ENCOUNTER — Encounter (HOSPITAL_COMMUNITY): Payer: Self-pay

## 2018-05-19 ENCOUNTER — Inpatient Hospital Stay (HOSPITAL_COMMUNITY)
Admission: AD | Admit: 2018-05-19 | Discharge: 2018-05-19 | Disposition: A | Discharge status: Home / Self Care | Payer: Medicaid Other | Attending: Obstetrics and Gynecology | Admitting: Obstetrics and Gynecology

## 2018-05-19 ENCOUNTER — Other Ambulatory Visit: Payer: Self-pay

## 2018-05-19 DIAGNOSIS — Z880 Allergy status to penicillin: Secondary | ICD-10-CM | POA: Diagnosis not present

## 2018-05-19 DIAGNOSIS — Z3A25 25 weeks gestation of pregnancy: Secondary | ICD-10-CM

## 2018-05-19 DIAGNOSIS — O4702 False labor before 37 completed weeks of gestation, second trimester: Secondary | ICD-10-CM | POA: Diagnosis not present

## 2018-05-19 DIAGNOSIS — R109 Unspecified abdominal pain: Secondary | ICD-10-CM | POA: Diagnosis not present

## 2018-05-19 DIAGNOSIS — O368120 Decreased fetal movements, second trimester, not applicable or unspecified: Secondary | ICD-10-CM | POA: Diagnosis not present

## 2018-05-19 LAB — URINALYSIS, ROUTINE W REFLEX MICROSCOPIC
Bilirubin Urine: NEGATIVE
Glucose, UA: NEGATIVE mg/dL
Hgb urine dipstick: NEGATIVE
Ketones, ur: NEGATIVE mg/dL
Leukocytes,Ua: NEGATIVE
Nitrite: NEGATIVE
Protein, ur: NEGATIVE mg/dL
Specific Gravity, Urine: 1.019 (ref 1.005–1.030)
pH: 6 (ref 5.0–8.0)

## 2018-05-19 LAB — WET PREP, GENITAL
Clue Cells Wet Prep HPF POC: NONE SEEN
Sperm: NONE SEEN
Trich, Wet Prep: NONE SEEN
YEAST WET PREP: NONE SEEN

## 2018-05-19 MED ORDER — NIFEDIPINE ER OSMOTIC RELEASE 30 MG PO TB24: 30.0000 mg | ORAL_TABLET | Freq: Two times a day (BID) | ORAL | 0 refills | Status: DC

## 2018-05-19 MED ORDER — NIFEDIPINE 10 MG PO CAPS
10.0000 mg | ORAL_CAPSULE | ORAL | Status: AC
  Administered 2018-05-19 (×3): 10 mg via ORAL
  Filled 2018-05-19 (×3): qty 1

## 2018-05-19 MED ORDER — LACTATED RINGERS IV BOLUS
1000.0000 mL | Freq: Once | INTRAVENOUS | Status: AC
  Administered 2018-05-19: 1000 mL via INTRAVENOUS

## 2018-05-19 NOTE — MAU Note (Signed)
Pt reports that she hasn't felt her baby move since last night.  FHR 144 in triage.  Pt also reports some lower abd cramping that she rates 6/10.  Pt denies vag. Bleeding or LOF.

## 2018-05-19 NOTE — MAU Provider Note (Addendum)
History     CSN: [id number]  Arrival date and time: 05/19/18 1309   First Provider Initiated Contact with Patient 05/19/18 1355      Chief Complaint  Patient presents with  . Decreased Fetal Movement  . Abdominal Pain   [id number] @25 .0 wks presenting with decreased FM and LAP. Reports no FM since last night. She is eating and drinking well. LAP started today. Describes as tightening in lower abdomen, and intermittent. Frequency varies from minutes to hours between. She does not think it is ctx. Denies VB and LOF. Denies urinary sx. No recent IC.    OB History    Gravida  5   Para  3   Term  3   Preterm      AB  1   Living  3     SAB      TAB  1   Ectopic      Multiple      Live Births  3           Past Medical History:  Diagnosis Date  . Anemia   . Anxiety   . Asthma    as a child  . Urinary tract infection     Past Surgical History:  Procedure Laterality Date  . DILATION AND CURETTAGE OF UTERUS    . LAPAROSCOPIC APPENDECTOMY N/A 03/22/2018   Procedure: APPENDECTOMY LAPAROSCOPIC;  Surgeon: Darnell Level, MD;  Location: [facility];  Service: General;  Laterality: N/A;    Family History  Problem Relation Age of Onset  . Diabetes Maternal Grandmother   . Diabetes Maternal Grandfather   . Diabetes Paternal Grandmother   . Diabetes Paternal Grandfather     Social History   Tobacco Use  . Smoking status: Never Smoker  . Smokeless tobacco: Never Used  Substance Use Topics  . Alcohol use: No  . Drug use: No    Allergies:  Allergies  Allergen Reactions  . Amoxicillin Nausea And Vomiting  . Cinnamon Swelling and Other (See Comments)    Mouth swelling. Pt states it is a minor allergy     Medications Prior to Admission  Medication Sig Dispense Refill Last Dose  . buPROPion (WELLBUTRIN XL) 150 MG 24 hr tablet TAKE 1 TABLET BY MOUTH EVERY DAY 90 tablet 0 03/21/2018 at 2100  . FLUoxetine (PROZAC) 40 MG capsule TAKE 1 CAPSULE BY MOUTH EVERY DAY 90  capsule 0 03/21/2018 at 2100  . HYDROcodone-acetaminophen (NORCO/VICODIN) 5-325 MG tablet Take 1 tablet by mouth every 4 (four) hours as needed for moderate pain. 15 tablet 0     Review of Systems  Constitutional: Negative for chills and fever.  Gastrointestinal: Positive for abdominal pain.  Genitourinary: Negative for dysuria, frequency, urgency, vaginal bleeding and vaginal discharge.  Musculoskeletal: Positive for back pain.   Physical Exam   Blood pressure 104/70, pulse 88, SpO2 98 %.  Physical Exam  Nursing note and vitals reviewed. Constitutional: She is oriented to person, place, and time. She appears well-developed and well-nourished. No distress.  HENT:  Head: Normocephalic and atraumatic.  Neck: Normal range of motion.  Cardiovascular: Normal rate.  Respiratory: Effort normal. No respiratory distress.  GI: Soft. She exhibits no distension. There is no abdominal tenderness.  gravid  Genitourinary:    Genitourinary Comments: SVE closed/thick   Musculoskeletal: Normal range of motion.  Neurological: She is alert and oriented to person, place, and time.  Skin: Skin is warm and dry.  Psychiatric: She has a normal mood and  affect.  EFM: 145 bpm, mod variability, + accels, rare variable decels Toco: irritability  Results for orders placed or performed during the hospital encounter of 05/19/18 (from the past 24 hour(s))  Urinalysis, Routine w reflex microscopic     Status: None   Collection Time: 05/19/18  1:42 PM  Result Value Ref Range   Color, Urine YELLOW YELLOW   APPearance CLEAR CLEAR   Specific Gravity, Urine 1.019 1.005 - 1.030   pH 6.0 5.0 - 8.0   Glucose, UA NEGATIVE NEGATIVE mg/dL   Hgb urine dipstick NEGATIVE NEGATIVE   Bilirubin Urine NEGATIVE NEGATIVE   Ketones, ur NEGATIVE NEGATIVE mg/dL   Protein, ur NEGATIVE NEGATIVE mg/dL   Nitrite NEGATIVE NEGATIVE   Leukocytes,Ua NEGATIVE NEGATIVE  Wet prep, genital     Status: Abnormal   Collection Time: 05/19/18   2:09 PM  Result Value Ref Range   Yeast Wet Prep HPF POC NONE SEEN NONE SEEN   Trich, Wet Prep NONE SEEN NONE SEEN   Clue Cells Wet Prep HPF POC NONE SEEN NONE SEEN   WBC, Wet Prep HPF POC MANY (A) NONE SEEN   Sperm NONE SEEN    Repeat cervical exam: unchanged from prior exam  MAU Course  Procedures Orders Placed This Encounter  Procedures  . Wet prep, genital    Standing Status:   Standing    Number of Occurrences:   1    Order Specific Question:   Patient immune status    Answer:   Normal  . Urinalysis, Routine w reflex microscopic    Standing Status:   Standing    Number of Occurrences:   1   Meds ordered this encounter  Medications  . NIFEdipine (PROCARDIA) capsule 10 mg  . lactated ringers bolus 1,000 mL   MDM Labs ordered and reviewed. Pt feeling FM since EFM applied, FM audible. Ctx noted on toco, pt continuing to feel pain. Procardia and IVF ordered. Transfer of care given to [place], CNM @ [address] [place]  05/19/2018 3:49 PM   *Consult with Dr. Jolayne Panther @ 1800 - notified of patient's complaints, assessments, lab & NST results, recommended tx plan Rx Procardia XL 30 mg BID - ok to d/c home, agrees with plan to F/U sooner with office than scheduled  Assessment and Plan  Preterm uterine contractions in second trimester, antepartum - Plan: Discharge patient - Rx for Procardia XL 30 mg BID - Information provided on PTL and preventing PTB - F/U with [facility] in 1 week (msg left on VM @ 1837) - Advised to return to MAU for >6 painful UC's/hr, VB like a period, no FM or DFM - Patient verbalized an understanding of the plan of care and agrees.   Raelyn Mora, MSN, CNM 05/19/2018 6:38 PM

## 2018-05-20 LAB — GC/CHLAMYDIA PROBE AMP (~~LOC~~) NOT AT ARMC
Chlamydia: NEGATIVE
Neisseria Gonorrhea: NEGATIVE

## 2018-06-10 ENCOUNTER — Other Ambulatory Visit: Payer: Self-pay | Admitting: Obstetrics and Gynecology

## 2018-06-15 ENCOUNTER — Other Ambulatory Visit: Payer: Self-pay | Admitting: Internal Medicine

## 2018-06-15 ENCOUNTER — Telehealth: Payer: Self-pay

## 2018-06-15 NOTE — Telephone Encounter (Signed)
Received inbound call from patient requesting medication refills as follows:  1. Prozac  2. Wellbutrin   Advised patient that OB-GYN had refilled Prozac per PCP.  Scheduled patient Webex appointment with PCP to discuss Wellbutrin.

## 2018-06-16 ENCOUNTER — Encounter: Payer: Self-pay | Admitting: Internal Medicine

## 2018-06-16 ENCOUNTER — Ambulatory Visit (INDEPENDENT_AMBULATORY_CARE_PROVIDER_SITE_OTHER): Payer: Medicaid Other | Admitting: Internal Medicine

## 2018-06-16 ENCOUNTER — Other Ambulatory Visit: Payer: Self-pay

## 2018-06-16 DIAGNOSIS — F329 Major depressive disorder, single episode, unspecified: Secondary | ICD-10-CM | POA: Diagnosis not present

## 2018-06-16 DIAGNOSIS — F419 Anxiety disorder, unspecified: Secondary | ICD-10-CM

## 2018-06-16 DIAGNOSIS — F32A Depression, unspecified: Secondary | ICD-10-CM

## 2018-06-16 MED ORDER — FLUOXETINE HCL 40 MG PO CAPS: 40.0000 mg | ORAL_CAPSULE | Freq: Every day | ORAL | 3 refills | Status: DC

## 2018-06-16 MED ORDER — BUPROPION HCL ER (XL) 150 MG PO TB24: 150.0000 mg | ORAL_TABLET | Freq: Every day | ORAL | 3 refills | Status: DC

## 2018-06-16 NOTE — Progress Notes (Signed)
Virtual Visit via Video Note  I connected with Lauren Griffith on 06/16/18 at  3:45 PM EDT by a video enabled telemedicine application and verified that I am speaking with the correct person using two identifiers.   I discussed the limitations of evaluation and management by telemedicine and the availability of in person appointments. The patient expressed understanding and agreed to proceed.  Pt was unable to connect to [facility] on her end. This visit was continued using Telemedicine. History of Present Illness:  Pt wants to discuss her anxiety and depression. She reports her divorce was finalized about 3 months ago. She stopped her Prozac and Wellbutrin after that, because most of her stress was gone. She is currently [redacted] weeks pregnant. The father of this baby was recently sent to rehab. Since that time, she has been having a lot of anxiety. Her OB started her back on Prozac 20 mg, but she has not picked up this prescription yet. She would like to get started back on the Wellbutrin as well. She has been referred to a therapist but has not yet seen them. She denies SI/HI.   Observations/Objective:  Alert and oriented. NAD Behavior and thought content are normal.  Assessment and Plan:  Anxiety and Depression:  PHQ 9 and GAD 7 score done Recurrent Support offered today She will continue Prozac 20 mg x 1 week, increase to 40 mg thereafter She will start Wellbutrin in 3 weeks Encouraged her to keep her appt with the therapist  Will follow up in 1 month via webex Follow Up Instructions:    I discussed the assessment and treatment plan with the patient. The patient was provided an opportunity to ask questions and all were answered. The patient agreed with the plan and demonstrated an understanding of the instructions.   The patient was advised to call back or seek an in-person evaluation if the symptoms worsen or if the condition fails to improve as anticipated.     Nicki Reaper,  NP

## 2018-06-16 NOTE — Patient Instructions (Signed)

## 2018-07-17 ENCOUNTER — Other Ambulatory Visit: Payer: Self-pay | Admitting: Obstetrics and Gynecology

## 2018-08-10 LAB — OB RESULTS CONSOLE GBS: GBS: NEGATIVE

## 2018-08-18 ENCOUNTER — Encounter (HOSPITAL_COMMUNITY): Payer: Self-pay | Admitting: *Deleted

## 2018-08-18 ENCOUNTER — Telehealth (HOSPITAL_COMMUNITY): Payer: Self-pay | Admitting: *Deleted

## 2018-08-18 ENCOUNTER — Other Ambulatory Visit (HOSPITAL_COMMUNITY): Payer: Self-pay | Admitting: *Deleted

## 2018-08-18 NOTE — Telephone Encounter (Signed)
Preadmission screen Message left on voicemail and office called.  They called pt to see if she would answer.

## 2018-08-18 NOTE — Telephone Encounter (Signed)
Preadmission screen  

## 2018-08-19 ENCOUNTER — Other Ambulatory Visit (HOSPITAL_COMMUNITY): Admission: RE | Admit: 2018-08-19 | Payer: Medicaid Other | Source: Ambulatory Visit

## 2018-08-20 ENCOUNTER — Other Ambulatory Visit (HOSPITAL_COMMUNITY): Payer: Self-pay | Admitting: *Deleted

## 2018-08-20 ENCOUNTER — Other Ambulatory Visit: Payer: Self-pay

## 2018-08-20 ENCOUNTER — Other Ambulatory Visit (HOSPITAL_COMMUNITY)
Admission: RE | Admit: 2018-08-20 | Discharge: 2018-08-20 | Disposition: A | Discharge status: Home / Self Care | Payer: Medicaid Other | Source: Ambulatory Visit | Attending: Obstetrics and Gynecology | Admitting: Obstetrics and Gynecology

## 2018-08-20 DIAGNOSIS — Z01812 Encounter for preprocedural laboratory examination: Secondary | ICD-10-CM | POA: Diagnosis not present

## 2018-08-20 DIAGNOSIS — Z1159 Encounter for screening for other viral diseases: Secondary | ICD-10-CM | POA: Insufficient documentation

## 2018-08-20 NOTE — MAU Note (Signed)
Asymptomatic, swab collected without problem. 

## 2018-08-21 LAB — NOVEL CORONAVIRUS, NAA (HOSP ORDER, SEND-OUT TO REF LAB; TAT 18-24 HRS): SARS-CoV-2, NAA: NOT DETECTED

## 2018-08-23 ENCOUNTER — Other Ambulatory Visit: Payer: Self-pay

## 2018-08-23 ENCOUNTER — Inpatient Hospital Stay (HOSPITAL_COMMUNITY): Payer: Medicaid Other

## 2018-08-23 ENCOUNTER — Encounter (HOSPITAL_COMMUNITY): Payer: Self-pay

## 2018-08-23 ENCOUNTER — Inpatient Hospital Stay (HOSPITAL_COMMUNITY): Payer: Medicaid Other | Admitting: Anesthesiology

## 2018-08-23 ENCOUNTER — Inpatient Hospital Stay (HOSPITAL_COMMUNITY)
Admission: AD | Admit: 2018-08-23 | Discharge: 2018-08-24 | DRG: 807 | Disposition: A | Discharge status: Home / Self Care | Payer: Medicaid Other | Attending: Obstetrics | Admitting: Obstetrics

## 2018-08-23 DIAGNOSIS — F419 Anxiety disorder, unspecified: Secondary | ICD-10-CM | POA: Diagnosis present

## 2018-08-23 DIAGNOSIS — Z3483 Encounter for supervision of other normal pregnancy, third trimester: Secondary | ICD-10-CM

## 2018-08-23 DIAGNOSIS — O99344 Other mental disorders complicating childbirth: Secondary | ICD-10-CM | POA: Diagnosis present

## 2018-08-23 DIAGNOSIS — D509 Iron deficiency anemia, unspecified: Secondary | ICD-10-CM | POA: Diagnosis present

## 2018-08-23 DIAGNOSIS — O9902 Anemia complicating childbirth: Secondary | ICD-10-CM | POA: Diagnosis present

## 2018-08-23 DIAGNOSIS — O26893 Other specified pregnancy related conditions, third trimester: Secondary | ICD-10-CM | POA: Diagnosis present

## 2018-08-23 DIAGNOSIS — Z3A39 39 weeks gestation of pregnancy: Secondary | ICD-10-CM | POA: Diagnosis not present

## 2018-08-23 DIAGNOSIS — F329 Major depressive disorder, single episode, unspecified: Secondary | ICD-10-CM | POA: Diagnosis present

## 2018-08-23 LAB — TYPE AND SCREEN
ABO/RH(D): A POS
Antibody Screen: NEGATIVE

## 2018-08-23 LAB — CBC
HCT: 36.3 % (ref 36.0–46.0)
Hemoglobin: 11 g/dL — ABNORMAL LOW (ref 12.0–15.0)
MCH: 24.2 pg — ABNORMAL LOW (ref 26.0–34.0)
MCHC: 30.3 g/dL (ref 30.0–36.0)
MCV: 79.8 fL — ABNORMAL LOW (ref 80.0–100.0)
Platelets: 282 10*3/uL (ref 150–400)
RBC: 4.55 MIL/uL (ref 3.87–5.11)
RDW: 17.3 % — ABNORMAL HIGH (ref 11.5–15.5)
WBC: 7.3 10*3/uL (ref 4.0–10.5)
nRBC: 0 % (ref 0.0–0.2)

## 2018-08-23 LAB — RPR: RPR Ser Ql: NONREACTIVE

## 2018-08-23 LAB — ABO/RH: ABO/RH(D): A POS

## 2018-08-23 MED ORDER — ONDANSETRON HCL 4 MG PO TABS: 4.0000 mg | ORAL_TABLET | ORAL | Status: DC | PRN

## 2018-08-23 MED ORDER — PHENYLEPHRINE 40 MCG/ML (10ML) SYRINGE FOR IV PUSH (FOR BLOOD PRESSURE SUPPORT)
PREFILLED_SYRINGE | INTRAVENOUS | Status: AC
  Filled 2018-08-23: qty 10

## 2018-08-23 MED ORDER — DIBUCAINE (PERIANAL) 1 % EX OINT: 1.0000 "application " | TOPICAL_OINTMENT | CUTANEOUS | Status: DC | PRN

## 2018-08-23 MED ORDER — ONDANSETRON HCL 4 MG/2ML IJ SOLN: 4.0000 mg | INTRAMUSCULAR | Status: DC | PRN

## 2018-08-23 MED ORDER — LIDOCAINE HCL (PF) 1 % IJ SOLN: 30.0000 mL | INTRAMUSCULAR | Status: DC | PRN

## 2018-08-23 MED ORDER — ACETAMINOPHEN 325 MG PO TABS
650.0000 mg | ORAL_TABLET | ORAL | Status: DC | PRN
  Administered 2018-08-23: 650 mg via ORAL
  Filled 2018-08-23: qty 2

## 2018-08-23 MED ORDER — WITCH HAZEL-GLYCERIN EX PADS: 1.0000 "application " | MEDICATED_PAD | CUTANEOUS | Status: DC | PRN

## 2018-08-23 MED ORDER — SODIUM CHLORIDE (PF) 0.9 % IJ SOLN
INTRAMUSCULAR | Status: DC | PRN
  Administered 2018-08-23: 12 mL/h via EPIDURAL

## 2018-08-23 MED ORDER — FLUOXETINE HCL 20 MG PO CAPS
40.0000 mg | ORAL_CAPSULE | Freq: Every day | ORAL | Status: DC
  Administered 2018-08-23: 40 mg via ORAL
  Filled 2018-08-23: qty 2

## 2018-08-23 MED ORDER — BUPROPION HCL ER (XL) 150 MG PO TB24
150.0000 mg | ORAL_TABLET | Freq: Every day | ORAL | Status: DC
  Administered 2018-08-23: 150 mg via ORAL
  Filled 2018-08-23 (×2): qty 1

## 2018-08-23 MED ORDER — SENNOSIDES-DOCUSATE SODIUM 8.6-50 MG PO TABS
2.0000 | ORAL_TABLET | ORAL | Status: DC
  Administered 2018-08-24: 2 via ORAL
  Filled 2018-08-23: qty 2

## 2018-08-23 MED ORDER — LACTATED RINGERS IV SOLN
500.0000 mL | INTRAVENOUS | Status: DC | PRN
  Administered 2018-08-23: 1000 mL via INTRAVENOUS

## 2018-08-23 MED ORDER — OXYTOCIN BOLUS FROM INFUSION
500.0000 mL | Freq: Once | INTRAVENOUS | Status: AC
  Administered 2018-08-23: 500 mL via INTRAVENOUS

## 2018-08-23 MED ORDER — FENTANYL CITRATE (PF) 100 MCG/2ML IJ SOLN: 50.0000 ug | INTRAMUSCULAR | Status: DC | PRN

## 2018-08-23 MED ORDER — TERBUTALINE SULFATE 1 MG/ML IJ SOLN: 0.2500 mg | Freq: Once | INTRAMUSCULAR | Status: DC | PRN

## 2018-08-23 MED ORDER — TETANUS-DIPHTH-ACELL PERTUSSIS 5-2.5-18.5 LF-MCG/0.5 IM SUSP: 0.5000 mL | Freq: Once | INTRAMUSCULAR | Status: DC

## 2018-08-23 MED ORDER — IBUPROFEN 600 MG PO TABS
600.0000 mg | ORAL_TABLET | Freq: Four times a day (QID) | ORAL | Status: DC
  Administered 2018-08-23 – 2018-08-24 (×4): 600 mg via ORAL
  Filled 2018-08-23 (×4): qty 1

## 2018-08-23 MED ORDER — ONDANSETRON HCL 4 MG/2ML IJ SOLN: 4.0000 mg | Freq: Four times a day (QID) | INTRAMUSCULAR | Status: DC | PRN

## 2018-08-23 MED ORDER — OXYCODONE-ACETAMINOPHEN 5-325 MG PO TABS: 2.0000 | ORAL_TABLET | ORAL | Status: DC | PRN

## 2018-08-23 MED ORDER — OXYTOCIN 40 UNITS IN NORMAL SALINE INFUSION - SIMPLE MED
1.0000 m[IU]/min | INTRAVENOUS | Status: DC
  Administered 2018-08-23: 2 m[IU]/min via INTRAVENOUS
  Filled 2018-08-23: qty 1000

## 2018-08-23 MED ORDER — OXYCODONE HCL 5 MG PO TABS
5.0000 mg | ORAL_TABLET | ORAL | Status: DC | PRN
  Filled 2018-08-23: qty 1

## 2018-08-23 MED ORDER — COCONUT OIL OIL: 1.0000 "application " | TOPICAL_OIL | Status: DC | PRN

## 2018-08-23 MED ORDER — SOD CITRATE-CITRIC ACID 500-334 MG/5ML PO SOLN: 30.0000 mL | ORAL | Status: DC | PRN

## 2018-08-23 MED ORDER — ACETAMINOPHEN 325 MG PO TABS: 650.0000 mg | ORAL_TABLET | ORAL | Status: DC | PRN

## 2018-08-23 MED ORDER — SIMETHICONE 80 MG PO CHEW: 80.0000 mg | CHEWABLE_TABLET | ORAL | Status: DC | PRN

## 2018-08-23 MED ORDER — OXYCODONE-ACETAMINOPHEN 5-325 MG PO TABS: 1.0000 | ORAL_TABLET | ORAL | Status: DC | PRN

## 2018-08-23 MED ORDER — FENTANYL-BUPIVACAINE-NACL 0.5-0.125-0.9 MG/250ML-% EP SOLN
EPIDURAL | Status: AC
  Filled 2018-08-23: qty 250

## 2018-08-23 MED ORDER — LACTATED RINGERS IV SOLN
INTRAVENOUS | Status: DC
  Administered 2018-08-23 (×2): via INTRAVENOUS

## 2018-08-23 MED ORDER — OXYTOCIN 40 UNITS IN NORMAL SALINE INFUSION - SIMPLE MED: 2.5000 [IU]/h | INTRAVENOUS | Status: DC

## 2018-08-23 MED ORDER — PRENATAL MULTIVITAMIN CH
1.0000 | ORAL_TABLET | Freq: Every day | ORAL | Status: DC
  Administered 2018-08-24: 1 via ORAL
  Filled 2018-08-23: qty 1

## 2018-08-23 MED ORDER — LIDOCAINE HCL (PF) 1 % IJ SOLN
INTRAMUSCULAR | Status: DC | PRN
  Administered 2018-08-23 (×2): 4 mL via EPIDURAL

## 2018-08-23 MED ORDER — DIPHENHYDRAMINE HCL 25 MG PO CAPS: 25.0000 mg | ORAL_CAPSULE | Freq: Four times a day (QID) | ORAL | Status: DC | PRN

## 2018-08-23 MED ORDER — BENZOCAINE-MENTHOL 20-0.5 % EX AERO
1.0000 "application " | INHALATION_SPRAY | CUTANEOUS | Status: DC | PRN
  Administered 2018-08-23: 1 via TOPICAL
  Filled 2018-08-23: qty 56

## 2018-08-23 NOTE — Anesthesia Procedure Notes (Signed)
Epidural Patient location during procedure: OB Start time: 08/23/2018 9:53 AM End time: 08/23/2018 10:05 AM  Staffing Anesthesiologist: Josephine Igo, MD Performed: anesthesiologist   Preanesthetic Checklist Completed: patient identified, site marked, surgical consent, pre-op evaluation, timeout performed, IV checked, risks and benefits discussed and monitors and equipment checked  Epidural Patient position: sitting Prep: site prepped and draped and DuraPrep Patient monitoring: continuous pulse ox and blood pressure Approach: midline Location: L3-L4 Injection technique: LOR air  Needle:  Needle type: Tuohy  Needle gauge: 17 G Needle length: 9 cm and 9 Needle insertion depth: 5 cm cm Catheter type: closed end flexible Catheter size: 19 Gauge Catheter at skin depth: 10 cm Test dose: negative  Assessment Events: blood not aspirated, injection not painful, no injection resistance, negative IV test and no paresthesia  Additional Notes Patient identified. Risks and benefits discussed including failed block, incomplete  Pain control, post dural puncture headache, nerve damage, paralysis, blood pressure Changes, nausea, vomiting, reactions to medications-both toxic and allergic and post Partum back pain. All questions were answered. Patient expressed understanding and wished to proceed. Sterile technique was used throughout procedure. Epidural site was Dressed with sterile barrier dressing. No paresthesias, signs of intravascular injection Or signs of intrathecal spread were encountered.  Patient was more comfortable after the epidural was dosed. Please see RN's note for documentation of vital signs and FHR which are stable. Reason for block:procedure for pain

## 2018-08-23 NOTE — Progress Notes (Signed)
Comfortable w epidural  BP 116/65   Pulse 72   Temp 98.5 F (36.9 C) (Oral)   Resp 18   Ht 5\' 3"  (1.6 m)   Wt 67.6 kg   LMP 11/23/2017   SpO2 100%   BMI 26.39 kg/m   Toco: q 2-3 minutes EFM: reactive, category 1 SVE: 6/70/-2, AROM clear fluid  A/P: g4p3 @ 39.0 w EIOL Continue pitocin FSR/gbs neg

## 2018-08-23 NOTE — Anesthesia Postprocedure Evaluation (Signed)
Anesthesia Post Note  Patient: Lauren Griffith  Procedure(s) Performed: AN AD [place]     Patient location during evaluation: Mother Baby Anesthesia Type: Epidural Level of consciousness: awake and alert Pain management: pain level controlled Vital Signs Assessment: post-procedure vital signs reviewed and stable Respiratory status: spontaneous breathing Cardiovascular status: stable Postop Assessment: no headache, patient able to bend at knees, no backache, no apparent nausea or vomiting, epidural receding, adequate PO intake and able to ambulate Anesthetic complications: no    Last Vitals:  Vitals:   08/23/18 1500 08/23/18 1537  BP:  132/87  Pulse:  71  Resp:  18  Temp:  36.6 C  SpO2: 99% 100%    Last Pain:  Vitals:   08/23/18 1452  TempSrc:   PainSc: 0-No pain   Pain Goal:                   [name]

## 2018-08-23 NOTE — Anesthesia Preprocedure Evaluation (Signed)
Anesthesia Evaluation  Patient identified by MRN, date of birth, ID band Patient awake    Reviewed: Allergy & Precautions, Patient's Chart, lab work & pertinent test results  Airway Mallampati: II  TM Distance: >3 FB Neck ROM: Full    Dental no notable dental hx. (+) Teeth Intact   Pulmonary asthma ,    Pulmonary exam normal breath sounds clear to auscultation       Cardiovascular negative cardio ROS Normal cardiovascular exam Rhythm:Regular Rate:Normal     Neuro/Psych PSYCHIATRIC DISORDERS Anxiety Depression negative neurological ROS     GI/Hepatic Neg liver ROS, GERD  ,  Endo/Other  negative endocrine ROS  Renal/GU negative Renal ROS  negative genitourinary   Musculoskeletal negative musculoskeletal ROS (+)   Abdominal (+) - obese,   Peds  Hematology  (+) anemia ,   Anesthesia Other Findings   Reproductive/Obstetrics (+) Pregnancy                             Anesthesia Physical Anesthesia Plan  ASA: II  Anesthesia Plan: Epidural   Post-op Pain Management:    Induction:   PONV Risk Score and Plan:   Airway Management Planned: Natural Airway  Additional Equipment:   Intra-op Plan:   Post-operative Plan:   Informed Consent: I have reviewed the patients History and Physical, chart, labs and discussed the procedure including the risks, benefits and alternatives for the proposed anesthesia with the patient or authorized representative who has indicated his/her understanding and acceptance.       Plan Discussed with: Anesthesiologist  Anesthesia Plan Comments:         Anesthesia Quick Evaluation

## 2018-08-23 NOTE — Progress Notes (Addendum)
[name] was referred for history of depression/anxiety. * Referral screened out by Clinical Social Worker because none of the following criteria appear to apply: ~ History of anxiety/depression during this pregnancy, or of post-partum depression following prior delivery. ~ Diagnosis of anxiety and/or depression within last 3 years OR * [name]'s symptoms currently being treated with medication and/or therapy. Per [name]'s H&P and further chart review, [name] has active prescription for Wellbutrin and Prozac for depression.     Please contact the Clinical Social Worker if needs arise, by [name] request, or if [name] scores greater than 9/yes to question 10 on Edinburgh Postpartum Depression Screen.     Lauren Stroebel S. Lauren Griffith, MSW, LCSW-[facility] [phone] 

## 2018-08-23 NOTE — H&P (Signed)
28 y.o. [id number] @ [redacted]w[redacted]d presents for elective induction of labor at term.  Reports that contractions began a few hours prior to arrival for induction, but are still mild.  Otherwise has good fetal movement and no bleeding.  Pregnancy c/b: 1. Depression and anxiety: Stopped prozac and wellbutrin with pregnancy.  Worsening symptoms due to social stressor regarding relationship with FOB.  Restarted both medications mid-pregnancy 2.  Acute appendicitis: s/p l/s appendectomy at 15 weeks 3.  Iron deficiency anemia  Past Medical History:  Diagnosis Date  . Anemia   . Anxiety   . Asthma    as a child  . Depression   . Urinary tract infection     Past Surgical History:  Procedure Laterality Date  . APPENDECTOMY    . DILATION AND CURETTAGE OF UTERUS    . LAPAROSCOPIC APPENDECTOMY N/A 03/22/2018   Procedure: APPENDECTOMY LAPAROSCOPIC;  Surgeon: Armandina Gemma, MD;  Location: [facility];  Service: General;  Laterality: N/A;    OB History  Gravida Para Term Preterm AB Living  5 3 3   1 3   SAB TAB Ectopic Multiple Live Births    1     3    # Outcome Date GA Lbr Len/2nd Weight Sex Delivery Anes PTL Lv  5 Current           4 Term 10/24/13 [redacted]w[redacted]d 08:32 / 00:30 3270 g F Vag-Spont EPI  LIV  3 Term 12/07/10 [redacted]w[redacted]d 01:54 / 01:10 3430 g F Vag-Spont EPI  LIV  2 Term 08/07/08    F Vag-Spont EPI N LIV  1 TAB              Birth Comments: CONFIDENTIAL    Social History   Socioeconomic History  . Marital status: Legally Separated    Spouse name: Not on file  . Number of children: Not on file  . Years of education: Not on file  . Highest education level: Not on file  Occupational History  . Not on file  Social Needs  . Financial resource strain: Not hard at all  . Food insecurity:    Worry: Never true    Inability: Never true  . Transportation needs:    Medical: No    Non-medical: Not on file  Tobacco Use  . Smoking status: Never Smoker  . Smokeless tobacco: Never Used  Substance and Sexual  Activity  . Alcohol use: No  . Drug use: No  . Sexual activity: Yes    Birth control/protection: None  Lifestyle  . Physical activity:    Days per week: Not on file    Minutes per session: Not on file  . Stress: To some extent  Relationships  . Social connections:    Talks on phone: Not on file    Gets together: Not on file    Attends religious service: Not on file    Active member of club or organization: Not on file    Attends meetings of clubs or organizations: Not on file    Relationship status: Not on file  . Intimate partner violence:    Fear of current or ex partner: No    Emotionally abused: No    Physically abused: No    Forced sexual activity: No  Other Topics Concern  . Not on file  Social History Narrative  . Not on file   Amoxicillin and Cinnamon    Prenatal Transfer Tool  Maternal Diabetes: No, failed 1hr, passed 3 hr Genetic Screening:  Normal  Low risk NIPT Maternal Ultrasounds/Referrals: Normal Fetal Ultrasounds or other Referrals:  None Maternal Substance Abuse:  No Significant Maternal Medications:  Meds include: Prozac Other:  wellbutrin Significant Maternal Lab Results: Lab values include: Group B Strep negative  ABO, Rh: --/--/A POS (01/06 1315) Antibody: NEG (01/06 1315) Rubella: Immune (11/26 0000) RPR: Nonreactive (11/26 0000)  HBsAg: Negative (11/26 0000)  HIV: Non-reactive (11/26 0000)  GBS: Negative (05/26 0000)     Vitals:   08/23/18 0647  BP: 133/82  Pulse: 89  Resp: 16  Temp: 98.5 F (36.9 C)     General:  NAD Abdomen:  soft, gravid, EFW 6.5-7# Ex:  no edema SVE:  4/60/-2 per RN FHTs:  150s, moderate variability, + accels Toco:  q2-4 minutes   A/P   28 y.o. [id number] presents with EIOL IOL--cervix favorable, will start pitocin  FSR/ vtx/ GBS negative  Lauren Griffith Lauren Griffith

## 2018-08-24 LAB — CBC
HCT: 29.9 % — ABNORMAL LOW (ref 36.0–46.0)
Hemoglobin: 9.2 g/dL — ABNORMAL LOW (ref 12.0–15.0)
MCH: 24.6 pg — ABNORMAL LOW (ref 26.0–34.0)
MCHC: 30.8 g/dL (ref 30.0–36.0)
MCV: 79.9 fL — ABNORMAL LOW (ref 80.0–100.0)
Platelets: 227 10*3/uL (ref 150–400)
RBC: 3.74 MIL/uL — ABNORMAL LOW (ref 3.87–5.11)
RDW: 17.3 % — ABNORMAL HIGH (ref 11.5–15.5)
WBC: 8.5 10*3/uL (ref 4.0–10.5)
nRBC: 0 % (ref 0.0–0.2)

## 2018-08-24 MED ORDER — HYDROCORTISONE 1 % EX CREA
TOPICAL_CREAM | Freq: Four times a day (QID) | CUTANEOUS | Status: DC
  Administered 2018-08-24: 06:00:00 via TOPICAL
  Filled 2018-08-24: qty 28

## 2018-08-24 NOTE — Discharge Summary (Signed)
Obstetric Discharge Summary Reason for Admission: induction of labor Prenatal Procedures: ultrasound Intrapartum Procedures: spontaneous vaginal delivery Postpartum Procedures: none Complications-Operative and Postpartum: none Hemoglobin  Date Value Ref Range Status  08/24/2018 9.2 (L) 12.0 - 15.0 g/dL Final   HCT  Date Value Ref Range Status  08/24/2018 29.9 (L) 36.0 - 46.0 % Final    Physical Exam:  General: alert and cooperative Lochia: appropriate Uterine Fundus: firm DVT Evaluation: No evidence of DVT seen on physical exam.  Discharge Diagnoses: Term Pregnancy-delivered  Discharge Information: Date: 08/24/2018 Activity: pelvic rest Diet: routine Medications: PNV and Ibuprofen Condition: stable Instructions: refer to practice specific booklet Discharge to: home Follow-up Information    Jerelyn Charles, MD Follow up in 4 week(s).   Specialty:  Obstetrics Contact information: [address] [place] [address] [phone]           Newborn Data: Live born female  Birth Weight: 6 lb 12.1 oz (3065 g) APGAR: 7, 8  Newborn Delivery   Birth date/time:  08/23/2018 13:00:00 Delivery type:  Vaginal, Spontaneous     Home with mother.  Allyn Kenner 08/24/2018, 9:31 AM

## 2018-08-26 ENCOUNTER — Encounter: Payer: Self-pay | Admitting: Internal Medicine

## 2018-08-27 ENCOUNTER — Telehealth: Payer: Self-pay | Admitting: Internal Medicine

## 2018-08-27 ENCOUNTER — Ambulatory Visit (INDEPENDENT_AMBULATORY_CARE_PROVIDER_SITE_OTHER): Payer: Medicaid Other | Admitting: Internal Medicine

## 2018-08-27 DIAGNOSIS — R21 Rash and other nonspecific skin eruption: Secondary | ICD-10-CM | POA: Diagnosis not present

## 2018-08-27 MED ORDER — CLOBETASOL PROPIONATE 0.05 % EX GEL: 1.0000 "application " | Freq: Two times a day (BID) | CUTANEOUS | 0 refills | Status: DC

## 2018-08-27 NOTE — Progress Notes (Signed)
Virtual Visit via Video Note  I connected with Lauren Griffith on 08/27/18 at  4:00 PM EDT by a video enabled telemedicine application and verified that I am speaking with the correct person using two identifiers.  Location: Patient: Home Provider: Office   I discussed the limitations of evaluation and management by telemedicine and the availability of in person appointments. The patient expressed understanding and agreed to proceed.  History of Present Illness:   Pt reports a rash of her abdomen. She noticed this 1 week ago. The rash itches. It does not burn, hurt or tingle. The rash has not spread. She reports she gave birth 1 week ago. She denies recent changes in soaps, lotions or detergents. She reports her boyfriend has poison ivy and thinks she may have gotten it from him.  Past Medical History:  Diagnosis Date  . Anemia   . Anxiety   . Asthma    as a child  . Depression   . Urinary tract infection     No current outpatient medications on file.   No current facility-administered medications for this visit.     Allergies  Allergen Reactions  . Amoxicillin Nausea And Vomiting  . Cinnamon Swelling and Other (See Comments)    Mouth swelling. Pt states it is a minor allergy     Family History  Problem Relation Age of Onset  . Diabetes Maternal Grandmother   . Diabetes Maternal Grandfather   . Diabetes Paternal Grandmother   . Diabetes Paternal Grandfather     Social History   Socioeconomic History  . Marital status: Legally Separated    Spouse name: Not on file  . Number of children: Not on file  . Years of education: Not on file  . Highest education level: Not on file  Occupational History  . Not on file  Social Needs  . Financial resource strain: Not hard at all  . Food insecurity    Worry: Never true    Inability: Never true  . Transportation needs    Medical: No    Non-medical: Not on file  Tobacco Use  . Smoking status: Never Smoker  . Smokeless  tobacco: Never Used  Substance and Sexual Activity  . Alcohol use: No  . Drug use: No  . Sexual activity: Yes    Birth control/protection: None  Lifestyle  . Physical activity    Days per week: Not on file    Minutes per session: Not on file  . Stress: To some extent  Relationships  . Social Musicianconnections    Talks on phone: Not on file    Gets together: Not on file    Attends religious service: Not on file    Active member of club or organization: Not on file    Attends meetings of clubs or organizations: Not on file    Relationship status: Not on file  . Intimate partner violence    Fear of current or ex partner: No    Emotionally abused: No    Physically abused: No    Forced sexual activity: No  Other Topics Concern  . Not on file  Social History Narrative  . Not on file     Constitutional: Denies fever, malaise, fatigue, headache or abrupt weight changes.  Respiratory: Denies difficulty breathing, shortness of breath, cough or sputum production.   Cardiovascular: Denies chest pain, chest tightness, palpitations or swelling in the hands or feet.  Skin: Pt reports rash of abdomen. Denies ulcercations.  No other specific complaints in a complete review of systems (except as listed in HPI above).  LMP 11/23/2017  Wt Readings from Last 3 Encounters:  08/23/18 149 lb (67.6 kg)  03/22/18 131 lb 7 oz (59.6 kg)  11/24/17 124 lb (56.2 kg)    General: Appears her stated age, well developed, well nourished in NAD. Skin: Vesicular lesion on erythematous base noted in dermatomal pattern bilateral abdomen. Pulmonary/Chest: Normal effort. No respiratory distress.  Neurological: Alert and oriented.        BMET    Component Value Date/Time   NA 133 (L) 03/22/2018 1315   K 3.6 03/22/2018 1315   CL 104 03/22/2018 1315   CO2 19 (L) 03/22/2018 1315   GLUCOSE 81 03/22/2018 1315   BUN 7 03/22/2018 1315   CREATININE 0.54 03/22/2018 1315   CALCIUM 8.6 (L) 03/22/2018 1315    GFRNONAA >60 03/22/2018 1315   GFRAA >60 03/22/2018 1315    Lipid Panel  No results found for: CHOL, TRIG, HDL, CHOLHDL, VLDL, LDLCALC  CBC    Component Value Date/Time   WBC 8.5 08/24/2018 0520   RBC 3.74 (L) 08/24/2018 0520   HGB 9.2 (L) 08/24/2018 0520   HCT 29.9 (L) 08/24/2018 0520   PLT 227 08/24/2018 0520   MCV 79.9 (L) 08/24/2018 0520   MCH 24.6 (L) 08/24/2018 0520   MCHC 30.8 08/24/2018 0520   RDW 17.3 (H) 08/24/2018 0520   LYMPHSABS 1.5 03/22/2018 1315   MONOABS 0.4 03/22/2018 1315   EOSABS 0.2 03/22/2018 1315   BASOSABS 0.0 03/22/2018 1315    Hgb A1C No results found for: HGBA1C     Assessment and Plan:  Rash:  Contact Dermatitis vs Herpes Zoster Will give RX for Clobetasol 0.05% gel BID  Will check herpes zoster IgM Advised no skin to skin contact with her newborn until lab results come back  Will follow up after labs are back, return precautions discussed  Follow Up Instructions:    I discussed the assessment and treatment plan with the patient. The patient was provided an opportunity to ask questions and all were answered. The patient agreed with the plan and demonstrated an understanding of the instructions.   The patient was advised to call back or seek an in-person evaluation if the symptoms worsen or if the condition fails to improve as anticipated.    Webb Silversmith, NP

## 2018-08-27 NOTE — Telephone Encounter (Signed)
Patient returned Melanie's call. °

## 2018-08-28 ENCOUNTER — Encounter: Payer: Self-pay | Admitting: Internal Medicine

## 2018-08-29 LAB — VARICELLA ZOSTER ANTIBODY, IGM: Varicella Zoster Ab IgM: <=0.9 (ref <=0.90)

## 2018-12-27 ENCOUNTER — Other Ambulatory Visit: Payer: Self-pay

## 2018-12-27 DIAGNOSIS — Z20822 Contact with and (suspected) exposure to covid-19: Secondary | ICD-10-CM

## 2018-12-28 LAB — NOVEL CORONAVIRUS, NAA: SARS-CoV-2, NAA: NOT DETECTED

## 2018-12-30 ENCOUNTER — Telehealth: Payer: Self-pay | Admitting: Internal Medicine

## 2018-12-30 NOTE — Telephone Encounter (Signed)
Patient called for Covid test results.  She was told that Covid was Not Detected.

## 2019-01-05 ENCOUNTER — Other Ambulatory Visit: Payer: Self-pay | Admitting: Internal Medicine

## 2019-03-18 NOTE — L&D Delivery Note (Signed)
Delivery Note Patient had spontaneous rupture of membranes and progressed rapidly to 10 cm.  She pushed with 2 contractions.  At 2:24 AM a viable female was delivered via Vaginal, Spontaneous (Presentation: Occiput Anterior).  APGAR: 8, 9; weight 3315 (7lb 4.9 oz) .   Placenta status: Spontaneous, Intact.  Cord: 3V  with the following complications: None.  Cord pH: n/a  Anesthesia:  None Episiotomy:  None Lacerations:  None Suture Repair: n/a Est. Blood Loss (mL):  100 mL  Mom to postpartum.  Baby to Couplet care / Skin to Skin.  Lauren Griffith GEFFEL Lauren Griffith 11/05/2019, 2:41 AM
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
## 2019-06-06 ENCOUNTER — Inpatient Hospital Stay (HOSPITAL_COMMUNITY)
Admission: AD | Admit: 2019-06-06 | Discharge: 2019-06-06 | Disposition: A | Discharge status: Home / Self Care | Payer: Medicaid Other | Attending: Obstetrics | Admitting: Obstetrics

## 2019-06-06 ENCOUNTER — Other Ambulatory Visit: Payer: Self-pay

## 2019-06-06 ENCOUNTER — Encounter (HOSPITAL_COMMUNITY): Payer: Self-pay | Admitting: Obstetrics

## 2019-06-06 DIAGNOSIS — O219 Vomiting of pregnancy, unspecified: Secondary | ICD-10-CM | POA: Diagnosis not present

## 2019-06-06 DIAGNOSIS — Z3A19 19 weeks gestation of pregnancy: Secondary | ICD-10-CM | POA: Diagnosis not present

## 2019-06-06 DIAGNOSIS — O210 Mild hyperemesis gravidarum: Secondary | ICD-10-CM | POA: Diagnosis present

## 2019-06-06 DIAGNOSIS — Z880 Allergy status to penicillin: Secondary | ICD-10-CM | POA: Insufficient documentation

## 2019-06-06 DIAGNOSIS — Z888 Allergy status to other drugs, medicaments and biological substances status: Secondary | ICD-10-CM | POA: Diagnosis not present

## 2019-06-06 LAB — COMPREHENSIVE METABOLIC PANEL
ALT: 10 U/L (ref 0–44)
AST: 15 U/L (ref 15–41)
Albumin: 3.2 g/dL — ABNORMAL LOW (ref 3.5–5.0)
Alkaline Phosphatase: 51 U/L (ref 38–126)
Anion gap: 12 (ref 5–15)
BUN: 9 mg/dL (ref 6–20)
CO2: 22 mmol/L (ref 22–32)
Calcium: 8.8 mg/dL — ABNORMAL LOW (ref 8.9–10.3)
Chloride: 102 mmol/L (ref 98–111)
Creatinine, Ser: 0.5 mg/dL (ref 0.44–1.00)
GFR calc Af Amer: >60 mL/min (ref >60)
GFR calc non Af Amer: >60 mL/min (ref >60)
Glucose, Bld: 83 mg/dL (ref 70–99)
Potassium: 3.7 mmol/L (ref 3.5–5.1)
Sodium: 136 mmol/L (ref 135–145)
Total Bilirubin: 0.3 mg/dL (ref 0.3–1.2)
Total Protein: 6.7 g/dL (ref 6.5–8.1)

## 2019-06-06 LAB — CBC WITH DIFFERENTIAL/PLATELET
Abs Immature Granulocytes: 0.03 10*3/uL (ref 0.00–0.07)
Basophils Absolute: 0 10*3/uL (ref 0.0–0.1)
Basophils Relative: 0 %
Eosinophils Absolute: 0.1 10*3/uL (ref 0.0–0.5)
Eosinophils Relative: 1 %
HCT: 34 % — ABNORMAL LOW (ref 36.0–46.0)
Hemoglobin: 10.9 g/dL — ABNORMAL LOW (ref 12.0–15.0)
Immature Granulocytes: 0 %
Lymphocytes Relative: 22 %
Lymphs Abs: 1.8 10*3/uL (ref 0.7–4.0)
MCH: 27.8 pg (ref 26.0–34.0)
MCHC: 32.1 g/dL (ref 30.0–36.0)
MCV: 86.7 fL (ref 80.0–100.0)
Monocytes Absolute: 0.5 10*3/uL (ref 0.1–1.0)
Monocytes Relative: 7 %
Neutro Abs: 5.7 10*3/uL (ref 1.7–7.7)
Neutrophils Relative %: 70 %
Platelets: 256 10*3/uL (ref 150–400)
RBC: 3.92 MIL/uL (ref 3.87–5.11)
RDW: 13.2 % (ref 11.5–15.5)
WBC: 8.1 10*3/uL (ref 4.0–10.5)
nRBC: 0 % (ref 0.0–0.2)

## 2019-06-06 LAB — RAPID URINE DRUG SCREEN, HOSP PERFORMED
Amphetamines: NOT DETECTED
Barbiturates: NOT DETECTED
Benzodiazepines: NOT DETECTED
Cocaine: NOT DETECTED
Opiates: NOT DETECTED
Tetrahydrocannabinol: POSITIVE — AB

## 2019-06-06 LAB — URINALYSIS, ROUTINE W REFLEX MICROSCOPIC
Bilirubin Urine: NEGATIVE
Glucose, UA: NEGATIVE mg/dL
Hgb urine dipstick: NEGATIVE
Ketones, ur: 80 mg/dL — AB
Nitrite: NEGATIVE
Protein, ur: 30 mg/dL — AB
Specific Gravity, Urine: 1.03 (ref 1.005–1.030)
pH: 5 (ref 5.0–8.0)

## 2019-06-06 MED ORDER — LACTATED RINGERS IV BOLUS
1000.0000 mL | Freq: Once | INTRAVENOUS | Status: AC
  Administered 2019-06-06: 1000 mL via INTRAVENOUS

## 2019-06-06 MED ORDER — SCOPOLAMINE 1 MG/3DAYS TD PT72
1.0000 | MEDICATED_PATCH | TRANSDERMAL | Status: DC
  Administered 2019-06-06: 20:00:00 1.5 mg via TRANSDERMAL
  Filled 2019-06-06: qty 1

## 2019-06-06 MED ORDER — ONDANSETRON 8 MG PO TBDP: 8.0000 mg | ORAL_TABLET | Freq: Three times a day (TID) | ORAL | 0 refills | Status: AC | PRN

## 2019-06-06 MED ORDER — FAMOTIDINE 20 MG PO TABS: 20.0000 mg | ORAL_TABLET | Freq: Every day | ORAL | 0 refills | Status: DC

## 2019-06-06 MED ORDER — PROMETHAZINE HCL 25 MG/ML IJ SOLN
12.5000 mg | Freq: Once | INTRAMUSCULAR | Status: DC
  Filled 2019-06-06: qty 1

## 2019-06-06 MED ORDER — LACTATED RINGERS IV BOLUS
1000.0000 mL | Freq: Once | INTRAVENOUS | Status: AC
  Administered 2019-06-06: 22:00:00 1000 mL via INTRAVENOUS

## 2019-06-06 MED ORDER — FAMOTIDINE IN NACL 20-0.9 MG/50ML-% IV SOLN
20.0000 mg | Freq: Once | INTRAVENOUS | Status: AC
  Administered 2019-06-06: 20 mg via INTRAVENOUS
  Filled 2019-06-06: qty 50

## 2019-06-06 MED ORDER — SODIUM CHLORIDE 0.9 % IV SOLN
8.0000 mg | Freq: Once | INTRAVENOUS | Status: AC
  Administered 2019-06-06: 8 mg via INTRAVENOUS
  Filled 2019-06-06: qty 4

## 2019-06-06 MED ORDER — SCOPOLAMINE 1 MG/3DAYS TD PT72: 1.0000 | MEDICATED_PATCH | TRANSDERMAL | 3 refills | Status: DC

## 2019-06-06 NOTE — Discharge Instructions (Signed)
Hyperemesis Gravidarum Hyperemesis gravidarum is a severe form of nausea and vomiting that happens during pregnancy. Hyperemesis is worse than morning sickness. It may cause you to have nausea or vomiting all day for many days. It may keep you from eating and drinking enough food and liquids, which can lead to dehydration, malnutrition, and weight loss. Hyperemesis usually occurs during the first half (the first 20 weeks) of pregnancy. It often goes away once a woman is in her second half of pregnancy. However, sometimes hyperemesis continues through an entire pregnancy. What are the causes? The cause of this condition is not known. It may be related to changes in chemicals (hormones) in the body during pregnancy, such as the high level of pregnancy hormone (human chorionic gonadotropin) or the increase in the female sex hormone (estrogen). What are the signs or symptoms? Symptoms of this condition include: Nausea that does not go away. Vomiting that does not allow you to keep any food down. Weight loss. Body fluid loss (dehydration). Having no desire to eat, or not liking food that you have previously enjoyed. How is this diagnosed? This condition may be diagnosed based on: A physical exam. Your medical history. Your symptoms. Blood tests. Urine tests. How is this treated? This condition is managed by controlling symptoms. This may include: Following an eating plan. This can help lessen nausea and vomiting. Taking prescription medicines. An eating plan and medicines are often used together to help control symptoms. If medicines do not help relieve nausea and vomiting, you may need to receive fluids through an IV at the hospital. Follow these instructions at home: Eating and drinking  Avoid the following: Drinking fluids with meals. Try not to drink anything during the 30 minutes before and after your meals. Drinking more than 1 cup of fluid at a time. Eating foods that trigger your  symptoms. These may include spicy foods, coffee, high-fat foods, very sweet foods, and acidic foods. Skipping meals. Nausea can be more intense on an empty stomach. If you cannot tolerate food, do not force it. Try sucking on ice chips or other frozen items and make up for missed calories later. Lying down within 2 hours after eating. Being exposed to environmental triggers. These may include food smells, smoky rooms, closed spaces, rooms with strong smells, warm or humid places, overly loud and noisy rooms, and rooms with motion or flickering lights. Try eating meals in a well-ventilated area that is free of strong smells. Quick and sudden changes in your movement. Taking iron pills and multivitamins that contain iron. If you take prescription iron pills, do not stop taking them unless your health care provider approves. Preparing food. The smell of food can spoil your appetite or trigger nausea. To help relieve your symptoms: Listen to your body. Everyone is different and has different preferences. Find what works best for you. Eat and drink slowly. Eat 5-6 small meals daily instead of 3 large meals. Eating small meals and snacks can help you avoid an empty stomach. In the morning, before getting out of bed, eat a couple of crackers to avoid moving around on an empty stomach. Try eating starchy foods as these are usually tolerated well. Examples include cereal, toast, bread, potatoes, pasta, rice, and pretzels. Include at least 1 serving of protein with your meals and snacks. Protein options include lean meats, poultry, seafood, beans, nuts, nut butters, eggs, cheese, and yogurt. Try eating a protein-rich snack before bed. Examples of a protein-rick snack include cheese and crackers or  a peanut butter sandwich made with 1 slice of whole-wheat bread and 1 tsp (5 g) of peanut butter. Eat or suck on things that have ginger in them. It may help relieve nausea. Add  tsp ground ginger to hot tea or  choose ginger tea. Try drinking 100% fruit juice or an electrolyte drink. An electrolyte drink contains sodium, potassium, and chloride. Drink fluids that are cold, clear, and carbonated or sour. Examples include lemonade, ginger ale, lemon-lime soda, ice water, and sparkling water. Brush your teeth or use a mouth rinse after meals. Talk with your health care provider about starting a supplement of vitamin B6. General instructions Take over-the-counter and prescription medicines only as told by your health care provider. Follow instructions from your health care provider about eating or drinking restrictions. Continue to take your prenatal vitamins as told by your health care provider. If you are having trouble taking your prenatal vitamins, talk with your health care provider about different options. Keep all follow-up and pre-birth (prenatal) visits as told by your health care provider. This is important. Contact a health care provider if: You have pain in your abdomen. You have a severe headache. You have vision problems. You are losing weight. You feel weak or dizzy. Get help right away if: You cannot drink fluids without vomiting. You vomit blood. You have constant nausea and vomiting. You are very weak. You faint. You have a fever and your symptoms suddenly get worse. Summary Hyperemesis gravidarum is a severe form of nausea and vomiting that happens during pregnancy. Making some changes to your eating habits may help relieve nausea and vomiting. This condition may be managed with medicine. If medicines do not help relieve nausea and vomiting, you may need to receive fluids through an IV at the hospital. This information is not intended to replace advice given to you by your health care provider. Make sure you discuss any questions you have with your health care provider. Document Revised: 03/23/2017 Document Reviewed: 10/31/2015 Elsevier Patient Education  2020 [employer]    Cannabinoid Hyperemesis Syndrome Cannabinoid hyperemesis syndrome (CHS) is a condition that causes repeated nausea, vomiting, and abdominal pain after long-term (chronic) use of marijuana (cannabis). People with CHS typically use marijuana 3-5 times a day for many years before they have symptoms, although it is possible to develop CHS with as little as 1 use per day. Symptoms of CHS may be mild at first but can get worse and more frequent. In some cases, CHS may cause vomiting many times a day, which can lead to weight loss and dehydration. CHS may go away and come back many times (recur). People may not have symptoms or may otherwise be healthy in between [facility] attacks. What are the causes? The exact cause of this condition is not known. Long-term use of marijuana may over-stimulate certain proteins in the brain that react with chemicals in marijuana (cannabinoid receptors). This over-stimulation may cause CHS. What are the signs or symptoms? Symptoms of this condition are often mild during the first few attacks, but they can get worse over time. Symptoms may include:  Frequent nausea, especially early in the morning.  Vomiting.  Abdominal pain. Taking several hot showers throughout the day can also be a sign of this condition. People with CHS may do this because it relieves symptoms. How is this diagnosed? This condition may be diagnosed based on:  Your symptoms and medical history, including any drug use.  A physical exam. You may have tests done  to rule out other problems. These tests may include:  Blood tests.  Urine tests.  Imaging tests, such as an X-ray or CT scan. How is this treated? Treatment for this condition involves stopping marijuana use. Your health care provider may recommend:  A drug rehabilitation program, if you have trouble stopping marijuana use.  Medicines for nausea.  Hot showers to help relieve symptoms. Certain creams that contain a substance  called capsaicin may improve symptoms when applied to the abdomen. Ask your health care provider before starting any medicines or other treatments. Severe nausea and vomiting may require you to stay at the hospital. You may need IV fluids to prevent or treat dehydration. You may also need certain medicines that must be given at the hospital. Follow these instructions at home: During an attack   Stay in bed and rest in a dark, quiet room.  Take anti-nausea medicine as told by your health care provider.  Try taking hot showers to relieve your symptoms. After an attack  Drink small amounts of clear fluids slowly. Gradually add more.  Once you are able to eat without vomiting, eat soft foods in small amounts every 3-4 hours. General instructions   Do not use any products that contain marijuana.If you need help quitting, ask your health care provider for resources and treatment options.  Drink enough fluid to keep your urine pale yellow. Avoid drinking fluids that have a lot of sugar or caffeine, such as coffee and soda.  Take and apply over-the-counter and prescription medicines only as told by your health care provider. Ask your health care provider before starting any new medicines or treatments.  Keep all follow-up visits as told by your health care provider. This is important. Contact a health care provider if:  Your symptoms get worse.  You cannot drink fluids without vomiting.  You have pain and trouble swallowing after an attack. Get help right away if:  You cannot stop vomiting.  You have blood in your vomit or your vomit looks like coffee grounds.  You have severe abdominal pain.  You have stools that are bloody or black, or stools that look like tar.  You have symptoms of dehydration, such as: ? Sunken eyes. ? Inability to make tears. ? Cracked lips. ? Dry mouth. ? Decreased urine production. ? Weakness. ? Sleepiness. ? Fainting. Summary  Cannabinoid  hyperemesis syndrome (CHS) is a condition that causes repeated nausea, vomiting, and abdominal pain after long-term use of marijuana.  People with CHS typically use marijuana 3-5 times a day for many years before they have symptoms, although it is possible to develop CHS with as little as 1 use per day.  Treatment for this condition involves stopping marijuana use. Hot showers and capsaicin creams may also help relieve symptoms. Ask your health care provider before starting any medicines or other treatments.  Your health care provider may prescribe medicines to help with nausea.  Get help right away if you have signs of dehydration, such as dry mouth, decreased urine production, or weakness. This information is not intended to replace advice given to you by your health care provider. Make sure you discuss any questions you have with your health care provider. Document Revised: 07/10/2017 Document Reviewed: 06/11/2016 Elsevier Patient Education  2020 [employer] Inc.     Morning Sickness  Morning sickness is when a woman feels nauseous during pregnancy. This nauseous feeling may or may not come with vomiting. It often occurs in the morning, but it can be  a problem at any time of day. Morning sickness is most common during the first trimester. In some cases, it may continue throughout pregnancy. Although morning sickness is unpleasant, it is usually harmless unless the woman develops severe and continual vomiting (hyperemesis gravidarum), a condition that requires more intense treatment. What are the causes? The exact cause of this condition is not known, but it seems to be related to normal hormonal changes that occur in pregnancy. What increases the risk? You are more likely to develop this condition if:  You experienced nausea or vomiting before your pregnancy.  You had morning sickness during a previous pregnancy.  You are pregnant with more than one baby, such as twins. What are the  signs or symptoms? Symptoms of this condition include:  Nausea.  Vomiting. How is this diagnosed? This condition is usually diagnosed based on your signs and symptoms. How is this treated? In many cases, treatment is not needed for this condition. Making some changes to what you eat may help to control symptoms. Your health care provider may also prescribe or recommend:  Vitamin B6 supplements.  Anti-nausea medicines.  Ginger. Follow these instructions at home: Medicines  Take over-the-counter and prescription medicines only as told by your health care provider. Do not use any prescription, over-the-counter, or herbal medicines for morning sickness without first talking with your health care provider.  Taking multivitamins before getting pregnant can prevent or decrease the severity of morning sickness in most women. Eating and drinking  Eat a piece of dry toast or crackers before getting out of bed in the morning.  Eat 5 or 6 small meals a day.  Eat dry and bland foods, such as rice or a baked potato. Foods that are high in carbohydrates are often helpful.  Avoid greasy, fatty, and spicy foods.  Have someone cook for you if the smell of any food causes nausea and vomiting.  If you feel nauseous after taking prenatal vitamins, take the vitamins at night or with a snack.  Snack on protein foods between meals if you are hungry. Nuts, yogurt, and cheese are good options.  Drink fluids throughout the day.  Try ginger ale made with real ginger, ginger tea made from fresh grated ginger, or ginger candies. General instructions  Do not use any products that contain nicotine or tobacco, such as cigarettes and e-cigarettes. If you need help quitting, ask your health care provider.  Get an air purifier to keep the air in your house free of odors.  Get plenty of fresh air.  Try to avoid odors that trigger your nausea.  Consider trying these methods to help relieve  symptoms: ? Wearing an acupressure wristband. These wristbands are often worn for seasickness. ? Acupuncture. Contact a health care provider if:  Your home remedies are not working and you need medicine.  You feel dizzy or light-headed.  You are losing weight. Get help right away if:  You have persistent and uncontrolled nausea and vomiting.  You faint.  You have severe pain in your abdomen. Summary  Morning sickness is when a woman feels nauseous during pregnancy. This nauseous feeling may or may not come with vomiting.  Morning sickness is most common during the first trimester.  It often occurs in the morning, but it can be a problem at any time of day.  In many cases, treatment is not needed for this condition. Making some changes to what you eat may help to control symptoms. This information is not intended  to replace advice given to you by your health care provider. Make sure you discuss any questions you have with your health care provider. Document Revised: 02/13/2017 Document Reviewed: 04/05/2016 Elsevier Patient Education  2020 [employer].      Marijuana Use During Pregnancy and Breastfeeding  Marijuana is the dried leaves, flowers, and stems of the Cannabis sativa or Cannabis indica plant. The plant's active ingredients (cannabinoids), including a chemical called THC, change the chemistry of the brain. Marijuana smoke also has many of the same chemicals as cigarette smoke that cause breathing problems. Marijuana gets into your blood through your lungs when you smoke it and through your digestive system when you swallow it. Using marijuana in any form may be harmful for you and your baby when you are trying to become pregnant and during pregnancy. This includes marijuana that is prescribed to you by a health care provider (medical marijuana). Once marijuana is in your blood, it can travel through your placenta to your baby. It may also pass through breast milk. How  does this affect me? Marijuana affects you both mentally and physically. Using marijuana can make you feel high and relaxed. It can also have negative effects, especially at high doses or with long-term use. These include:  Rapid heartbeat and stress on your heart.  Lung irritation and breathing problems.  Difficulty thinking and making decisions.  Seeing or believing things that are not true (hallucinations and paranoia).  Mood swings, depression, or anxiety.  Decreased ability to learn and remember.  Difficulty getting pregnant. Marijuana can also affect your pregnancy. Not all the effects are known. However, if you use marijuana during pregnancy, you may:  Be less likely to get regular prenatal care and do the things that you need to do to have a healthy pregnancy.  Be more likely to use other drugs that can harm your pregnancy, like drinking alcohol and smoking cigarettes.  Be at higher risk of having your baby die after 28 weeks of pregnancy (stillbirth).  Be at higher risk of giving birth before 37 weeks of pregnancy (premature birth). How does this affect my baby? If you use marijuana during pregnancy, this may affect your baby's development, birth, and life after birth. Your baby may:  Be born prematurely, which can cause physical and mental problems.  Be born with a low birth weight, which can lead to physical and mental problems.  Have problems with brain development.  Have difficulty growing.  Have attention and behavior problems later in life.  Do poorly at school and have learning problems later in life.  Have problems with vision and coordination.  Be at higher risk for using marijuana by age 25. More research is needed to find out exactly how marijuana affects a baby during breastfeeding. Some studies suggest that the chemicals in marijuana can be passed to a baby through breast milk. To limit possible risks, you should not use marijuana during  breastfeeding. Follow these instructions at home:  Let your health care provider know if you use marijuana before trying to get pregnant, during pregnancy, or during breastfeeding.  Do not use marijuana in any form when you are trying to get pregnant, when you are pregnant, or when you are breastfeeding. If you are having trouble stopping marijuana use, ask your health care provider for help.  Do not smoke. If you need help quitting, ask your health care provider for help.  If you are using medical marijuana, ask your health care provider to switch  you to a medicine that is safer to use during pregnancy or breastfeeding.  Keep all your prenatal visits as told by your health care provider. This is important. Where to find more information [employer]: www.drugabuse.gov [employer]: www.marchofdimes.org/pregnancy Contact a health care provider if:  You use marijuana and want to get pregnant.  You use marijuana during pregnancy or breastfeeding.  You need help stopping marijuana use. Get help right away if:  Your baby is not gaining weight or growing as expected. Summary  Using marijuana in any form may be harmful for you and your baby when you are trying to become pregnant, during pregnancy, and during breastfeeding. This includes marijuana that is prescribed to you (medical marijuana).  Some studies suggest that marijuana may pass through breast milk and can affect your baby's brain development.  Talk to your health care provider if you use marijuana in any form while trying to get pregnant, during pregnancy, or while breastfeeding.  Ask your health care provider for help if you are not able to stop using marijuana. This information is not intended to replace advice given to you by your health care provider. Make sure you discuss any questions you have with your health care provider. Document Revised: 06/25/2018 Document Reviewed: 11/19/2016 Elsevier Patient  Education  2020 [employer]

## 2019-06-06 NOTE — MAU Note (Signed)
Pt states vomiting x 3 days, been unable to keep liquids/solids down; states zofran, diclegis, phenergan of prescribed home meds have not been helpful.  Denies VB, LOF, or pain. Denies diarrhea or fever.

## 2019-06-06 NOTE — MAU Provider Note (Signed)
History     CSN: [id number]  Arrival date and time: 06/06/19 1749   First Provider Initiated Contact with Patient 06/06/19 1822      Chief Complaint  Patient presents with  . Emesis   Ms. Lauren Griffith is a 29 y.o. [id number] at [redacted]w[redacted]d who presents to MAU for nausea and vomiting. Patient has been able to drink Gatorade successfully today. Patient denies any known exposures to COVID.  Onset: morning sickness since start of pregnancy, worse over past 3 days Location: stomach Duration: 3 days Character: constant nausea, vomiting x6-8 every day Aggravating/Associated: none/none Relieving: none Treatment: Zofran (last took around 1pm), Diclegis (last took this morning), both did not work Severity: no pain  Pt denies VB, LOF, ctx, vaginal discharge/odor/itching. Pt denies abdominal pain, constipation, diarrhea, or urinary problems. Pt denies fever, chills, fatigue, sweating or changes in appetite. Pt denies SOB or chest pain. Pt denies dizziness, HA, light-headedness, weakness.  Problems this pregnancy include: morning sickness. Allergies? AMOX, cinnamon Current medications/supplements? Prozac, Zofran, Diclegis, CBD gummy PRN Prenatal care provider? [facility], next appt 06/15/2019   OB History    Gravida  6   Para  4   Term  4   Preterm      AB  1   Living  4     SAB      TAB  1   Ectopic      Multiple  0   Live Births  4           Past Medical History:  Diagnosis Date  . Anemia   . Anxiety    takes prozac  . Asthma    as a child  . Depression   . Urinary tract infection     Past Surgical History:  Procedure Laterality Date  . APPENDECTOMY    . DILATION AND CURETTAGE OF UTERUS    . LAPAROSCOPIC APPENDECTOMY N/A 03/22/2018   Procedure: APPENDECTOMY LAPAROSCOPIC;  Surgeon: Darnell Level, MD;  Location: [facility];  Service: General;  Laterality: N/A;    Family History  Problem Relation Age of Onset  . Diabetes Maternal Grandmother   .  Diabetes Maternal Grandfather   . Diabetes Paternal Grandmother   . Diabetes Paternal Grandfather     Social History   Tobacco Use  . Smoking status: Never Smoker  . Smokeless tobacco: Never Used  Substance Use Topics  . Alcohol use: No  . Drug use: No    Allergies:  Allergies  Allergen Reactions  . Amoxicillin Nausea And Vomiting  . Cinnamon Swelling and Other (See Comments)    Mouth swelling. Pt states it is a minor allergy     Medications Prior to Admission  Medication Sig Dispense Refill Last Dose  . clobetasol (TEMOVATE) 0.05 % GEL APPLY TO AFFECTED AREA TWICE A DAY 30 g 0     Review of Systems  Constitutional: Negative for chills, diaphoresis, fatigue and fever.  Eyes: Negative for visual disturbance.  Respiratory: Negative for shortness of breath.   Cardiovascular: Negative for chest pain.  Gastrointestinal: Positive for nausea and vomiting. Negative for abdominal pain, constipation and diarrhea.  Genitourinary: Negative for dysuria, flank pain, frequency, pelvic pain, urgency, vaginal bleeding and vaginal discharge.  Neurological: Negative for dizziness, weakness, light-headedness and headaches.   Physical Exam   Blood pressure 95/62, pulse 83, temperature 98.4 F (36.9 C), temperature source Oral, resp. rate 16, height 5\' 3"  (1.6 m), weight 57.3 kg, SpO2 98 %, unknown if currently  breastfeeding.  Patient Vitals for the past 24 hrs:  BP Temp Temp src Pulse Resp SpO2 Height Weight  06/06/19 1819 95/62 98.4 F (36.9 C) Oral 83 16 98 % -- --  06/06/19 1804 -- -- -- -- -- -- 5\' 3"  (1.6 m) 57.3 kg   Physical Exam  Constitutional: She is oriented to person, place, and time. She appears well-developed and well-nourished. No distress.  HENT:  Head: Normocephalic and atraumatic.  Respiratory: Effort normal.  GI: Soft. She exhibits no distension and no mass. There is no abdominal tenderness. There is no rebound and no guarding.  Neurological: She is alert and  oriented to person, place, and time.  Skin: Skin is warm and dry. She is not diaphoretic.  Psychiatric: She has a normal mood and affect. Her behavior is normal. Judgment and thought content normal.  FHR 160  Results for orders placed or performed during the hospital encounter of 06/06/19 (from the past 24 hour(s))  Urinalysis, Routine w reflex microscopic     Status: Abnormal   Collection Time: 06/06/19  6:22 PM  Result Value Ref Range   Color, Urine YELLOW YELLOW   APPearance HAZY (A) CLEAR   Specific Gravity, Urine 1.030 1.005 - 1.030   pH 5.0 5.0 - 8.0   Glucose, UA NEGATIVE NEGATIVE mg/dL   Hgb urine dipstick NEGATIVE NEGATIVE   Bilirubin Urine NEGATIVE NEGATIVE   Ketones, ur 80 (A) NEGATIVE mg/dL   Protein, ur 30 (A) NEGATIVE mg/dL   Nitrite NEGATIVE NEGATIVE   Leukocytes,Ua MODERATE (A) NEGATIVE   RBC / HPF 0-5 0 - 5 RBC/hpf   WBC, UA 6-10 0 - 5 WBC/hpf   Bacteria, UA FEW (A) NONE SEEN   Squamous Epithelial / LPF 0-5 0 - 5   Mucus PRESENT   Urine rapid drug screen (hosp performed)     Status: Abnormal   Collection Time: 06/06/19  6:28 PM  Result Value Ref Range   Opiates NONE DETECTED NONE DETECTED   Cocaine NONE DETECTED NONE DETECTED   Benzodiazepines NONE DETECTED NONE DETECTED   Amphetamines NONE DETECTED NONE DETECTED   Tetrahydrocannabinol POSITIVE (A) NONE DETECTED   Barbiturates NONE DETECTED NONE DETECTED  CBC with Differential/Platelet     Status: Abnormal   Collection Time: 06/06/19  7:34 PM  Result Value Ref Range   WBC 8.1 4.0 - 10.5 K/uL   RBC 3.92 3.87 - 5.11 MIL/uL   Hemoglobin 10.9 (L) 12.0 - 15.0 g/dL   HCT 34.0 (L) 36.0 - 46.0 %   MCV 86.7 80.0 - 100.0 fL   MCH 27.8 26.0 - 34.0 pg   MCHC 32.1 30.0 - 36.0 g/dL   RDW 13.2 11.5 - 15.5 %   Platelets 256 150 - 400 K/uL   nRBC 0.0 0.0 - 0.2 %   Neutrophils Relative % 70 %   Neutro Abs 5.7 1.7 - 7.7 K/uL   Lymphocytes Relative 22 %   Lymphs Abs 1.8 0.7 - 4.0 K/uL   Monocytes Relative 7 %    Monocytes Absolute 0.5 0.1 - 1.0 K/uL   Eosinophils Relative 1 %   Eosinophils Absolute 0.1 0.0 - 0.5 K/uL   Basophils Relative 0 %   Basophils Absolute 0.0 0.0 - 0.1 K/uL   Immature Granulocytes 0 %   Abs Immature Granulocytes 0.03 0.00 - 0.07 K/uL  Comprehensive metabolic panel     Status: Abnormal   Collection Time: 06/06/19  7:34 PM  Result Value Ref Range   Sodium  136 135 - 145 mmol/L   Potassium 3.7 3.5 - 5.1 mmol/L   Chloride 102 98 - 111 mmol/L   CO2 22 22 - 32 mmol/L   Glucose, Bld 83 70 - 99 mg/dL   BUN 9 6 - 20 mg/dL   Creatinine, Ser 6.28 0.44 - 1.00 mg/dL   Calcium 8.8 (L) 8.9 - 10.3 mg/dL   Total Protein 6.7 6.5 - 8.1 g/dL   Albumin 3.2 (L) 3.5 - 5.0 g/dL   AST 15 15 - 41 U/L   ALT 10 0 - 44 U/L   Alkaline Phosphatase 51 38 - 126 U/L   Total Bilirubin 0.3 0.3 - 1.2 mg/dL   GFR calc non Af Amer >60 >60 mL/min   GFR calc Af Amer >60 >60 mL/min   Anion gap 12 5 - 15    MAU Course  Procedures  MDM -nausea and vomiting in pregnancy with worsening over the past 3 days with patient still able to consume some liquids -weight today 57.3kg, no other weights on file -UA: hazy/80ketones/30PRO/mod leuks/few bacteria, sending urine for culture -UDS: +THC, pt reports she started using new CBD gummy recently in the past couple of weeks -CBC w/Diff: H/H 10.9/34.0, otherwise WNL -CMP: WNL for pregnancy -1L LR + 12.5mg  Phenergan + Pepcid 20mg  + scopolamine patch ordered, patient declines Phenergan as she is driving herself home -Zofran 8mg  ordered for 9pm, given -after fluids, patient able to urinate, PO challenge successful -pt discharged to home in stable condition  Orders Placed This Encounter  Procedures  . Culture, OB Urine    Standing Status:   Standing    Number of Occurrences:   1  . Urinalysis, Routine w reflex microscopic    Standing Status:   Standing    Number of Occurrences:   1  . Urine rapid drug screen (hosp performed)    Standing Status:   Standing     Number of Occurrences:   1  . CBC with Differential/Platelet    Standing Status:   Standing    Number of Occurrences:   1  . Comprehensive metabolic panel    Standing Status:   Standing    Number of Occurrences:   1  . Insert peripheral IV    Standing Status:   Standing    Number of Occurrences:   1  . Discharge patient    Order Specific Question:   Discharge disposition    Answer:   01-Home or Self Care [1]    Order Specific Question:   Discharge patient date    Answer:   06/06/2019   Meds ordered this encounter  Medications  . lactated ringers bolus 1,000 mL  . famotidine (PEPCID) IVPB 20 mg premix  . DISCONTD: promethazine (PHENERGAN) injection 12.5 mg  . scopolamine (TRANSDERM-SCOP) 1 MG/3DAYS 1.5 mg  . ondansetron (ZOFRAN) 8 mg in sodium chloride 0.9 % 50 mL IVPB  . lactated ringers bolus 1,000 mL  . scopolamine (TRANSDERM-SCOP) 1 MG/3DAYS    Sig: Place 1 patch (1.5 mg total) onto the skin every 3 (three) days.    Dispense:  10 patch    Refill:  3    Order Specific Question:   Supervising Provider    Answer:   CONSTANT, PEGGY [[id number]]  . ondansetron (ZOFRAN ODT) 8 MG disintegrating tablet    Sig: Take 1 tablet (8 mg total) by mouth every 8 (eight) hours as needed for up to 7 days for nausea or vomiting.  Dispense:  30 tablet    Refill:  0    Order Specific Question:   Supervising Provider    Answer:   CONSTANT, PEGGY [[id number]]  . famotidine (PEPCID) 20 MG tablet    Sig: Take 1 tablet (20 mg total) by mouth daily.    Dispense:  30 tablet    Refill:  0    Order Specific Question:   Supervising Provider    Answer:   CONSTANT, PEGGY [[id number]]   Assessment and Plan   1. Nausea and vomiting in pregnancy   2. [redacted] weeks gestation of pregnancy    Allergies as of 06/06/2019      Reactions   Amoxicillin Nausea And Vomiting   Cinnamon Swelling, Other (See Comments)   Mouth swelling. Pt states it is a minor allergy       Medication List    TAKE these medications   clobetasol  0.05 % Gel Commonly known as: TEMOVATE APPLY TO AFFECTED AREA TWICE A DAY   famotidine 20 MG tablet Commonly known as: PEPCID Take 1 tablet (20 mg total) by mouth daily.   ondansetron 8 MG disintegrating tablet Commonly known as: Zofran ODT Take 1 tablet (8 mg total) by mouth every 8 (eight) hours as needed for up to 7 days for nausea or vomiting.   scopolamine 1 MG/3DAYS Commonly known as: TRANSDERM-SCOP Place 1 patch (1.5 mg total) onto the skin every 3 (three) days. Start taking on: June 09, 2019      -will call with culture results, if positive -discussed +UDS for [facility], advised patient to stop CBD gummies, unless prescribed -pt can discontinue Diclegis, but discussed can take 100mg  Vitamin B6, 100mg  BID -pt advised to take medications around the clock and not to stop taking if feeling better -discussed nonpharmacologic and pharmacologic treatments of N/V -discussed normal expectations for N/V in pregnancy -pt discharged to home in stable condition   Koston Hennes 06/06/2019, 9:38 PM

## 2019-06-07 LAB — CULTURE, OB URINE: Culture: NO GROWTH

## 2019-06-15 DIAGNOSIS — A630 Anogenital (venereal) warts: Secondary | ICD-10-CM | POA: Insufficient documentation

## 2019-07-27 ENCOUNTER — Other Ambulatory Visit: Payer: Self-pay | Admitting: Internal Medicine

## 2019-08-01 ENCOUNTER — Encounter: Payer: Self-pay | Admitting: Internal Medicine

## 2019-08-29 ENCOUNTER — Encounter: Payer: Self-pay | Admitting: Internal Medicine

## 2019-08-29 ENCOUNTER — Telehealth (INDEPENDENT_AMBULATORY_CARE_PROVIDER_SITE_OTHER): Payer: Medicaid Other | Admitting: Internal Medicine

## 2019-08-29 DIAGNOSIS — F32A Depression, unspecified: Secondary | ICD-10-CM

## 2019-08-29 DIAGNOSIS — F419 Anxiety disorder, unspecified: Secondary | ICD-10-CM | POA: Diagnosis not present

## 2019-08-29 DIAGNOSIS — F329 Major depressive disorder, single episode, unspecified: Secondary | ICD-10-CM

## 2019-08-29 MED ORDER — BUPROPION HCL ER (XL) 150 MG PO TB24: 150.0000 mg | ORAL_TABLET | Freq: Every day | ORAL | 0 refills | Status: DC

## 2019-08-29 MED ORDER — FLUOXETINE HCL 40 MG PO CAPS: 40.0000 mg | ORAL_CAPSULE | Freq: Every day | ORAL | 1 refills | Status: DC

## 2019-08-29 NOTE — Assessment & Plan Note (Signed)
Stable of Fluoxetine and Wellbutrin, refilled today Support offered

## 2019-08-29 NOTE — Progress Notes (Signed)
Virtual Visit via Video Note  I connected with Lauren Griffith on 08/29/19 at 12:15 PM EDT by a video enabled telemedicine application and verified that I am speaking with the correct person using two identifiers.  Location: Patient: Home Provider: Office   I discussed the limitations of evaluation and management by telemedicine and the availability of in person appointments. The patient expressed understanding and agreed to proceed.  History of Present Illness:  Pt due for follow up of anxiety and depression. She was restarted on Fluoxetine and Wellbutrin 1 year ago. She is currently pregnant, and this has been cleared by her OB. She feels like the medications are working well for her and she would like a refill of these today. She is not currently seeing a therapist. She denies SI/HI.   Past Medical History:  Diagnosis Date  . Anemia   . Anxiety    takes prozac  . Asthma    as a child  . Depression   . Urinary tract infection     Current Outpatient Medications  Medication Sig Dispense Refill  . clobetasol (TEMOVATE) 0.05 % GEL APPLY TO AFFECTED AREA TWICE A DAY 30 g 0  . famotidine (PEPCID) 20 MG tablet Take 1 tablet (20 mg total) by mouth daily. 30 tablet 0  . scopolamine (TRANSDERM-SCOP) 1 MG/3DAYS Place 1 patch (1.5 mg total) onto the skin every 3 (three) days. 10 patch 3   No current facility-administered medications for this visit.    Allergies  Allergen Reactions  . Amoxicillin Nausea And Vomiting  . Cinnamon Swelling and Other (See Comments)    Mouth swelling. Pt states it is a minor allergy     Family History  Problem Relation Age of Onset  . Diabetes Maternal Grandmother   . Diabetes Maternal Grandfather   . Diabetes Paternal Grandmother   . Diabetes Paternal Grandfather     Social History   Socioeconomic History  . Marital status: Legally Separated    Spouse name: Not on file  . Number of children: Not on file  . Years of education: Not on file  .  Highest education level: Not on file  Occupational History  . Not on file  Tobacco Use  . Smoking status: Never Smoker  . Smokeless tobacco: Never Used  Vaping Use  . Vaping Use: Never used  Substance and Sexual Activity  . Alcohol use: No  . Drug use: No  . Sexual activity: Yes    Birth control/protection: None  Other Topics Concern  . Not on file  Social History Narrative  . Not on file   Social Determinants of Health   Financial Resource Strain:   . Difficulty of Paying Living Expenses:   Food Insecurity:   . Worried About Programme researcher, broadcasting/film/video in the Last Year:   . Barista in the Last Year:   Transportation Needs:   . Freight forwarder (Medical):   Marland Kitchen Lack of Transportation (Non-Medical):   Physical Activity:   . Days of Exercise per Week:   . Minutes of Exercise per Session:   Stress:   . Feeling of Stress :   Social Connections:   . Frequency of Communication with Friends and Family:   . Frequency of Social Gatherings with Friends and Family:   . Attends Religious Services:   . Active Member of Clubs or Organizations:   . Attends Banker Meetings:   Marland Kitchen Marital Status:   Intimate Partner Violence:   .  Fear of Current or Ex-Partner:   . Emotionally Abused:   Marland Kitchen Physically Abused:   . Sexually Abused:      Constitutional: Denies fever, malaise, fatigue, headache or abrupt weight changes.  Respiratory: Denies difficulty breathing, shortness of breath, cough or sputum production.   Cardiovascular: Denies chest pain, chest tightness, palpitations or swelling in the hands or feet.  Psych: Pt reports anxiety and depression. Denies SI/HI.  No other specific complaints in a complete review of systems (except as listed in HPI above).  Observations/Objective:    Wt Readings from Last 3 Encounters:  06/06/19 126 lb 4.8 oz (57.3 kg)  08/23/18 149 lb (67.6 kg)  03/22/18 131 lb 7 oz (59.6 kg)    General: Appears her stated age, well  developed, well nourished in NAD. Pulmonary/Chest: Normal effort. No respiratory distress.  Neurological: Alert and oriented.  Psychiatric: Mood and affect normal. Behavior is normal. Judgment and thought content normal.     BMET    Component Value Date/Time   NA 136 06/06/2019 1934   K 3.7 06/06/2019 1934   CL 102 06/06/2019 1934   CO2 22 06/06/2019 1934   GLUCOSE 83 06/06/2019 1934   BUN 9 06/06/2019 1934   CREATININE 0.50 06/06/2019 1934   CALCIUM 8.8 (L) 06/06/2019 1934   GFRNONAA >60 06/06/2019 1934   GFRAA >60 06/06/2019 1934    Lipid Panel  No results found for: CHOL, TRIG, HDL, CHOLHDL, VLDL, LDLCALC  CBC    Component Value Date/Time   WBC 8.1 06/06/2019 1934   RBC 3.92 06/06/2019 1934   HGB 10.9 (L) 06/06/2019 1934   HCT 34.0 (L) 06/06/2019 1934   PLT 256 06/06/2019 1934   MCV 86.7 06/06/2019 1934   MCH 27.8 06/06/2019 1934   MCHC 32.1 06/06/2019 1934   RDW 13.2 06/06/2019 1934   LYMPHSABS 1.8 06/06/2019 1934   MONOABS 0.5 06/06/2019 1934   EOSABS 0.1 06/06/2019 1934   BASOSABS 0.0 06/06/2019 1934    Hgb A1C No results found for: HGBA1C     Assessment and Plan:   Follow Up Instructions:    I discussed the assessment and treatment plan with the patient. The patient was provided an opportunity to ask questions and all were answered. The patient agreed with the plan and demonstrated an understanding of the instructions.   The patient was advised to call back or seek an in-person evaluation if the symptoms worsen or if the condition fails to improve as anticipated.     Webb Silversmith, NP

## 2019-08-29 NOTE — Patient Instructions (Signed)

## 2019-09-28 ENCOUNTER — Other Ambulatory Visit (HOSPITAL_COMMUNITY): Payer: Self-pay | Admitting: *Deleted

## 2019-09-29 ENCOUNTER — Other Ambulatory Visit: Payer: Self-pay

## 2019-09-29 ENCOUNTER — Encounter (HOSPITAL_COMMUNITY)
Admission: RE | Admit: 2019-09-29 | Discharge: 2019-09-29 | Disposition: A | Discharge status: Home / Self Care | Payer: Medicaid Other | Source: Ambulatory Visit | Attending: Obstetrics and Gynecology | Admitting: Obstetrics and Gynecology

## 2019-09-29 DIAGNOSIS — D509 Iron deficiency anemia, unspecified: Secondary | ICD-10-CM | POA: Insufficient documentation

## 2019-09-29 DIAGNOSIS — O99019 Anemia complicating pregnancy, unspecified trimester: Secondary | ICD-10-CM | POA: Insufficient documentation

## 2019-09-29 MED ORDER — SODIUM CHLORIDE 0.9 % IV SOLN
510.0000 mg | INTRAVENOUS | Status: DC
  Administered 2019-09-29: 510 mg via INTRAVENOUS
  Filled 2019-09-29: qty 17

## 2019-09-29 NOTE — Progress Notes (Signed)
After receiving IV Feraheme for about 3 minutes patient became pale, clammy and reported dizziness, "room spinning, I feel like I am going to pass out". BP upon arrival was 110 systolic and dropped to 93 systolic. Called Dr. Enrique Sack Ross's office but MD not in office this week so called Dr. Chestine Spore, covering physician for Dr. Tenny Craw to report issues. Also informed that after about 5 minutes of stopping infusion pt reported all symptoms subsided. Blood pressure increasing as well. Okay for pt to be DC home with no further IV Feraheme to be given. Next week's appointment cancelled.

## 2019-09-29 NOTE — Discharge Instructions (Signed)

## 2019-10-06 ENCOUNTER — Encounter (HOSPITAL_COMMUNITY): Payer: Medicaid Other

## 2019-11-04 ENCOUNTER — Inpatient Hospital Stay (HOSPITAL_COMMUNITY)
Admission: AD | Admit: 2019-11-04 | Discharge: 2019-11-06 | DRG: 807 | Disposition: A | Discharge status: Home / Self Care | Payer: Medicaid Other | Attending: Obstetrics | Admitting: Obstetrics

## 2019-11-04 ENCOUNTER — Encounter (HOSPITAL_COMMUNITY): Payer: Self-pay | Admitting: Obstetrics

## 2019-11-04 ENCOUNTER — Other Ambulatory Visit: Payer: Self-pay

## 2019-11-04 DIAGNOSIS — Z3A38 38 weeks gestation of pregnancy: Secondary | ICD-10-CM

## 2019-11-04 DIAGNOSIS — O9902 Anemia complicating childbirth: Principal | ICD-10-CM | POA: Diagnosis present

## 2019-11-04 DIAGNOSIS — F329 Major depressive disorder, single episode, unspecified: Secondary | ICD-10-CM | POA: Diagnosis present

## 2019-11-04 DIAGNOSIS — O99344 Other mental disorders complicating childbirth: Secondary | ICD-10-CM | POA: Diagnosis present

## 2019-11-04 DIAGNOSIS — F419 Anxiety disorder, unspecified: Secondary | ICD-10-CM | POA: Diagnosis present

## 2019-11-04 DIAGNOSIS — D509 Iron deficiency anemia, unspecified: Secondary | ICD-10-CM | POA: Diagnosis present

## 2019-11-04 DIAGNOSIS — Z20822 Contact with and (suspected) exposure to covid-19: Secondary | ICD-10-CM | POA: Diagnosis present

## 2019-11-04 NOTE — MAU Note (Signed)
Pt here with reports of contractions that started tonight around 6pm, now every 4 mins. She denies LOF or vaginal bleeding. Reports good fetal movement. Cervix was 3cm today in the office. Pt denies being scheduled for IOL.

## 2019-11-05 ENCOUNTER — Encounter (HOSPITAL_COMMUNITY): Payer: Self-pay | Admitting: Obstetrics

## 2019-11-05 DIAGNOSIS — Z20822 Contact with and (suspected) exposure to covid-19: Secondary | ICD-10-CM | POA: Diagnosis present

## 2019-11-05 DIAGNOSIS — Z3A38 38 weeks gestation of pregnancy: Secondary | ICD-10-CM | POA: Diagnosis not present

## 2019-11-05 DIAGNOSIS — O26893 Other specified pregnancy related conditions, third trimester: Secondary | ICD-10-CM | POA: Diagnosis present

## 2019-11-05 DIAGNOSIS — F329 Major depressive disorder, single episode, unspecified: Secondary | ICD-10-CM | POA: Diagnosis present

## 2019-11-05 DIAGNOSIS — D509 Iron deficiency anemia, unspecified: Secondary | ICD-10-CM | POA: Diagnosis present

## 2019-11-05 DIAGNOSIS — O9902 Anemia complicating childbirth: Secondary | ICD-10-CM | POA: Diagnosis present

## 2019-11-05 DIAGNOSIS — F419 Anxiety disorder, unspecified: Secondary | ICD-10-CM | POA: Diagnosis present

## 2019-11-05 DIAGNOSIS — O99344 Other mental disorders complicating childbirth: Secondary | ICD-10-CM | POA: Diagnosis present

## 2019-11-05 LAB — CBC
HCT: 28.2 % — ABNORMAL LOW (ref 36.0–46.0)
Hemoglobin: 7.9 g/dL — ABNORMAL LOW (ref 12.0–15.0)
MCH: 20.5 pg — ABNORMAL LOW (ref 26.0–34.0)
MCHC: 28 g/dL — ABNORMAL LOW (ref 30.0–36.0)
MCV: 73.2 fL — ABNORMAL LOW (ref 80.0–100.0)
Platelets: 260 10*3/uL (ref 150–400)
RBC: 3.85 MIL/uL — ABNORMAL LOW (ref 3.87–5.11)
RDW: 17.7 % — ABNORMAL HIGH (ref 11.5–15.5)
WBC: 8.1 10*3/uL (ref 4.0–10.5)
nRBC: 0 % (ref 0.0–0.2)

## 2019-11-05 LAB — TYPE AND SCREEN
ABO/RH(D): A POS
Antibody Screen: NEGATIVE

## 2019-11-05 LAB — RPR: RPR Ser Ql: NONREACTIVE

## 2019-11-05 LAB — SARS CORONAVIRUS 2 BY RT PCR (HOSPITAL ORDER, PERFORMED IN ~~LOC~~ HOSPITAL LAB): SARS Coronavirus 2: NEGATIVE

## 2019-11-05 MED ORDER — FENTANYL CITRATE (PF) 100 MCG/2ML IJ SOLN
INTRAMUSCULAR | Status: AC
  Filled 2019-11-05: qty 2

## 2019-11-05 MED ORDER — SENNOSIDES-DOCUSATE SODIUM 8.6-50 MG PO TABS
2.0000 | ORAL_TABLET | ORAL | Status: DC
  Administered 2019-11-05: 2 via ORAL
  Filled 2019-11-05: qty 2

## 2019-11-05 MED ORDER — OXYCODONE HCL 5 MG PO TABS
5.0000 mg | ORAL_TABLET | ORAL | Status: DC | PRN
  Administered 2019-11-05: 5 mg via ORAL
  Filled 2019-11-05: qty 1

## 2019-11-05 MED ORDER — EPHEDRINE 5 MG/ML INJ: 10.0000 mg | INTRAVENOUS | Status: DC | PRN

## 2019-11-05 MED ORDER — BENZOCAINE-MENTHOL 20-0.5 % EX AERO: 1.0000 "application " | INHALATION_SPRAY | CUTANEOUS | Status: DC | PRN

## 2019-11-05 MED ORDER — OXYCODONE HCL 5 MG PO TABS: 10.0000 mg | ORAL_TABLET | ORAL | Status: DC | PRN

## 2019-11-05 MED ORDER — DIPHENHYDRAMINE HCL 50 MG/ML IJ SOLN: 12.5000 mg | INTRAMUSCULAR | Status: DC | PRN

## 2019-11-05 MED ORDER — OXYTOCIN-SODIUM CHLORIDE 30-0.9 UT/500ML-% IV SOLN
2.5000 [IU]/h | INTRAVENOUS | Status: DC
  Administered 2019-11-05: 2.5 [IU]/h via INTRAVENOUS
  Filled 2019-11-05: qty 500

## 2019-11-05 MED ORDER — LIDOCAINE HCL (PF) 1 % IJ SOLN
30.0000 mL | INTRAMUSCULAR | Status: DC | PRN
  Filled 2019-11-05: qty 30

## 2019-11-05 MED ORDER — WITCH HAZEL-GLYCERIN EX PADS: 1.0000 "application " | MEDICATED_PAD | CUTANEOUS | Status: DC | PRN

## 2019-11-05 MED ORDER — PHENYLEPHRINE 40 MCG/ML (10ML) SYRINGE FOR IV PUSH (FOR BLOOD PRESSURE SUPPORT): 80.0000 ug | PREFILLED_SYRINGE | INTRAVENOUS | Status: DC | PRN

## 2019-11-05 MED ORDER — FENTANYL-BUPIVACAINE-NACL 0.5-0.125-0.9 MG/250ML-% EP SOLN
EPIDURAL | Status: AC
  Filled 2019-11-05: qty 250

## 2019-11-05 MED ORDER — ACETAMINOPHEN 325 MG PO TABS
650.0000 mg | ORAL_TABLET | ORAL | Status: DC | PRN
  Administered 2019-11-05 – 2019-11-06 (×3): 650 mg via ORAL
  Filled 2019-11-05 (×3): qty 2

## 2019-11-05 MED ORDER — ONDANSETRON HCL 4 MG/2ML IJ SOLN: 4.0000 mg | INTRAMUSCULAR | Status: DC | PRN

## 2019-11-05 MED ORDER — LACTATED RINGERS IV SOLN: 500.0000 mL | INTRAVENOUS | Status: DC | PRN

## 2019-11-05 MED ORDER — FERROUS SULFATE 325 (65 FE) MG PO TABS
325.0000 mg | ORAL_TABLET | Freq: Every day | ORAL | Status: DC
  Administered 2019-11-05 – 2019-11-06 (×2): 325 mg via ORAL
  Filled 2019-11-05 (×2): qty 1

## 2019-11-05 MED ORDER — DIBUCAINE (PERIANAL) 1 % EX OINT: 1.0000 "application " | TOPICAL_OINTMENT | CUTANEOUS | Status: DC | PRN

## 2019-11-05 MED ORDER — TETANUS-DIPHTH-ACELL PERTUSSIS 5-2.5-18.5 LF-MCG/0.5 IM SUSP: 0.5000 mL | Freq: Once | INTRAMUSCULAR | Status: DC

## 2019-11-05 MED ORDER — OXYCODONE-ACETAMINOPHEN 5-325 MG PO TABS: 1.0000 | ORAL_TABLET | ORAL | Status: DC | PRN

## 2019-11-05 MED ORDER — DIPHENHYDRAMINE HCL 25 MG PO CAPS: 25.0000 mg | ORAL_CAPSULE | Freq: Four times a day (QID) | ORAL | Status: DC | PRN

## 2019-11-05 MED ORDER — OXYTOCIN BOLUS FROM INFUSION: 333.0000 mL | Freq: Once | INTRAVENOUS | Status: DC

## 2019-11-05 MED ORDER — SOD CITRATE-CITRIC ACID 500-334 MG/5ML PO SOLN: 30.0000 mL | ORAL | Status: DC | PRN

## 2019-11-05 MED ORDER — ONDANSETRON HCL 4 MG PO TABS: 4.0000 mg | ORAL_TABLET | ORAL | Status: DC | PRN

## 2019-11-05 MED ORDER — IBUPROFEN 600 MG PO TABS
600.0000 mg | ORAL_TABLET | Freq: Four times a day (QID) | ORAL | Status: DC
  Administered 2019-11-05 – 2019-11-06 (×6): 600 mg via ORAL
  Filled 2019-11-05 (×6): qty 1

## 2019-11-05 MED ORDER — ACETAMINOPHEN 325 MG PO TABS: 650.0000 mg | ORAL_TABLET | ORAL | Status: DC | PRN

## 2019-11-05 MED ORDER — FENTANYL CITRATE (PF) 100 MCG/2ML IJ SOLN
100.0000 ug | INTRAMUSCULAR | Status: DC | PRN
  Administered 2019-11-05: 100 ug via INTRAVENOUS

## 2019-11-05 MED ORDER — LACTATED RINGERS IV SOLN: INTRAVENOUS | Status: DC

## 2019-11-05 MED ORDER — SIMETHICONE 80 MG PO CHEW: 80.0000 mg | CHEWABLE_TABLET | ORAL | Status: DC | PRN

## 2019-11-05 MED ORDER — LACTATED RINGERS IV SOLN: 500.0000 mL | Freq: Once | INTRAVENOUS | Status: DC

## 2019-11-05 MED ORDER — FENTANYL-BUPIVACAINE-NACL 0.5-0.125-0.9 MG/250ML-% EP SOLN: 12.0000 mL/h | EPIDURAL | Status: DC | PRN

## 2019-11-05 MED ORDER — ONDANSETRON HCL 4 MG/2ML IJ SOLN: 4.0000 mg | Freq: Four times a day (QID) | INTRAMUSCULAR | Status: DC | PRN

## 2019-11-05 MED ORDER — COCONUT OIL OIL: 1.0000 "application " | TOPICAL_OIL | Status: DC | PRN

## 2019-11-05 MED ORDER — FLUOXETINE HCL 20 MG PO CAPS
40.0000 mg | ORAL_CAPSULE | Freq: Every day | ORAL | Status: DC
  Administered 2019-11-05 – 2019-11-06 (×2): 40 mg via ORAL
  Filled 2019-11-05 (×2): qty 2

## 2019-11-05 MED ORDER — PRENATAL MULTIVITAMIN CH
1.0000 | ORAL_TABLET | Freq: Every day | ORAL | Status: DC
  Administered 2019-11-05: 1 via ORAL
  Filled 2019-11-05 (×2): qty 1

## 2019-11-05 MED ORDER — OXYCODONE-ACETAMINOPHEN 5-325 MG PO TABS: 2.0000 | ORAL_TABLET | ORAL | Status: DC | PRN

## 2019-11-05 MED ORDER — BUPROPION HCL ER (XL) 150 MG PO TB24
150.0000 mg | ORAL_TABLET | Freq: Every day | ORAL | Status: DC
  Administered 2019-11-05 – 2019-11-06 (×2): 150 mg via ORAL
  Filled 2019-11-05 (×3): qty 1

## 2019-11-05 NOTE — Progress Notes (Signed)
Patient is doing well.  She is ambulating, voiding, tolerating PO.  Pain control is good.  Lochia is appropriate  Vitals:   11/05/19 0345 11/05/19 0415 11/05/19 0505 11/05/19 0854  BP: 118/73 118/84 107/72 105/67  Pulse: 76 66 81 68  Resp:  18 18 18   Temp:  98.1 F (36.7 C) 98.1 F (36.7 C) 97.6 F (36.4 C)  TempSrc:  Oral Oral Oral  SpO2:  100% 100%   Weight:      Height:        NAD Fundus firm Ext: No edema  Lab Results  Component Value Date   WBC 8.1 11/05/2019   HGB 7.9 (L) 11/05/2019   HCT 28.2 (L) 11/05/2019   MCV 73.2 (L) 11/05/2019   PLT 260 11/05/2019    --/--/A POS (08/21 0130)/RImmune  A/P 29 y.o. 11-28-1988 PPD#0 s/p TSVD. Routine care.   Iron deficiency anemia--discharge w PO iron Expect d/c tomorrow.    [facility]

## 2019-11-05 NOTE — Lactation Note (Signed)
This note was copied from a baby's chart. Lactation Consultation Note  Patient Name: Lauren Griffith [id number]'[id number] Date: 11/05/2019  P5, 14 hour ETI female infant. RN informed LC that she doesn't want LC services at this time.    Maternal Data    Feeding Feeding Type: Bottle Fed - Formula Nipple Type: Slow - flow  LATCH Score                   Interventions    Lactation Tools Discussed/Used     Consult Status      Danelle Earthly 11/05/2019, 4:47 PM

## 2019-11-05 NOTE — MAU Note (Signed)
Pt called out stating she thinks her water broke. Upon assessment patient appeared grossly ruptured with green fluid noted on pad. Fern test was negative. 7/90/-1/BBOW. Dr. Chestine Spore notified pt admitted to L&D.

## 2019-11-05 NOTE — H&P (Signed)
29 y.o. [id number] @ [redacted]w[redacted]d presents with early labor.  In MAU, she had ROM and rapid progression to 10 cm.  Otherwise has good fetal movement and no bleeding.  Pregnancy c/b: 1. Short interval pregnancy 2. Iron deficiency anemia--had reaction to IV iron infusion, on PO iron.   3. Depression / anxiety--on prozac and wellbutrin  Past Medical History:  Diagnosis Date  . Anemia   . Anxiety    takes prozac  . Asthma    as a child  . Depression   . Urinary tract infection     Past Surgical History:  Procedure Laterality Date  . APPENDECTOMY    . DILATION AND CURETTAGE OF UTERUS    . LAPAROSCOPIC APPENDECTOMY N/A 03/22/2018   Procedure: APPENDECTOMY LAPAROSCOPIC;  Surgeon: Darnell Level, MD;  Location: [facility];  Service: General;  Laterality: N/A;    OB History  Gravida Para Term Preterm AB Living  6 4 4   1 4   SAB TAB Ectopic Multiple Live Births    1   0 4    # Outcome Date GA Lbr Len/2nd Weight Sex Delivery Anes PTL Lv  6 Current           5 Term 08/23/18 [redacted]w[redacted]d 05:14 / 00:10 3065 g [redacted]w[redacted]d EPI  LIV  4 Term 10/24/13 [redacted]w[redacted]d 08:32 / 00:30 3270 g F Vag-Spont EPI  LIV  3 Term 12/07/10 [redacted]w[redacted]d 01:54 / 01:10 3430 g F Vag-Spont EPI  LIV  2 Term 08/07/08    F Vag-Spont EPI N LIV  1 TAB              Birth Comments: CONFIDENTIAL    Social History   Socioeconomic History  . Marital status: Legally Separated    Spouse name: Not on file  . Number of children: Not on file  . Years of education: Not on file  . Highest education level: Not on file  Occupational History  . Not on file  Tobacco Use  . Smoking status: Never Smoker  . Smokeless tobacco: Never Used  Vaping Use  . Vaping Use: Never used  Substance and Sexual Activity  . Alcohol use: No  . Drug use: No  . Sexual activity: Yes    Birth control/protection: None  Other Topics Concern  . Not on file  Social History Narrative  . Not on file   Social Determinants of Health      Amoxicillin and Cinnamon    Prenatal  Transfer Tool  Maternal Diabetes: No Genetic Screening: Normal Maternal Ultrasounds/Referrals: Normal Fetal Ultrasounds or other Referrals:  None Maternal Substance Abuse:  No Significant Maternal Medications:  Meds include: Prozac Other:  wellbutrin Significant Maternal Lab Results: Group B Strep negative  ABO, Rh: --/--/A POS (08/21 0130) Antibody: NEG (08/21 0130) Rubella:  Immune RPR:   NR HBsAg:   Neg HIV:   Neg GBS:   Neg    Vitals:   11/04/19 2327 11/05/19 0138  BP: 116/88 129/69  Pulse: 99 87  Resp: 17 16  Temp: 98.1 F (36.7 C)      General:  NAD Abdomen:  soft, gravid Ex:  no edema SVE:  10/100/+1 FHTs:  120s, moderate variability, + accels, Category q Toco:  q2 min   A/P   29 y.o. 38 [redacted]w[redacted]d presents with labor Anticipate SVD  FSR/ vtx/ GBS neg  Lauren Griffith Lauren Griffith

## 2019-11-05 NOTE — Progress Notes (Signed)
CBC returned after delivery.  Hemoglobin 7.9.  Patient with known iron deficiency anemia, reaction to IV iron that required discontinuation. Will plan to continue PO iron on discharge.

## 2019-11-06 MED ORDER — IBUPROFEN 600 MG PO TABS
600.0000 mg | ORAL_TABLET | Freq: Four times a day (QID) | ORAL | 0 refills | Status: DC
Start: 2019-11-06 — End: 2021-06-24

## 2019-11-06 MED ORDER — FAMOTIDINE 20 MG PO TABS
20.0000 mg | ORAL_TABLET | Freq: Once | ORAL | Status: AC
  Administered 2019-11-06: 20 mg via ORAL
  Filled 2019-11-06: qty 1

## 2019-11-06 MED ORDER — OXYCODONE HCL 5 MG PO TABS
5.0000 mg | ORAL_TABLET | Freq: Four times a day (QID) | ORAL | 0 refills | Status: DC | PRN
Start: 2019-11-06 — End: 2020-07-12

## 2019-11-06 NOTE — Progress Notes (Signed)
[name] was referred for history of depression/anxiety. * Referral screened out by Clinical Social Worker because none of the following criteria appear to apply: ~ History of anxiety/depression during this pregnancy, or of post-partum depression. ~ Diagnosis of anxiety and/or depression within last 3 years OR * [name]'s symptoms currently being treated with medication and/or therapy. [name] has an active Rx for Prozac and Wellbutrin.  CSW received consult for hx of marijuana use.  Referral was screened out due to the following: ~[name] had no documented substance use after initial prenatal visit/+UPT. ~[name] had no positive drug screens after initial prenatal visit/+UPT. ~Baby's UDS is negative.  Please consult CSW if current concerns arise or by [name]'s request.  CSW will monitor CDS results and make report to Child Protective Services if warranted.   Please contact the Clinical Social Worker if needs arise, or if [name] requests.  Blaine Hamper, MSW, LCSW Clinical Social Work [phone]

## 2019-11-06 NOTE — Discharge Summary (Signed)
Postpartum Discharge Summary     Patient Name: Lauren Griffith DOB: 10-Sep-1990 MRN: [id number]  Date of admission: 11/04/2019 Delivery date:11/05/2019  Delivering provider: Marlow Baars  Date of discharge: 11/06/2019  Admitting diagnosis: Indication for care in labor or delivery [O75.9]--Labor Intrauterine pregnancy: [redacted]w[redacted]d     Secondary diagnosis:  Active Problems:   Indication for care in labor or delivery    Discharge diagnosis: Term Pregnancy Delivered                                              Post partum procedures:None Augmentation: N/A Complications: None  Hospital course: Onset of Labor With Vaginal Delivery      29 y.o. yo [id number] at [redacted]w[redacted]d was admitted in Active Labor on 11/04/2019. Patient had an uncomplicated labor course as follows:  Membrane Rupture Time/Date: 2:23 AM ,11/05/2019   Delivery Method:Vaginal, Spontaneous  Episiotomy: None  Lacerations:  None  Patient had an uncomplicated postpartum course.  She is ambulating, tolerating a regular diet, passing flatus, and urinating well. Patient is discharged home in stable condition on 11/06/19.  Newborn Data: Birth date:11/05/2019  Birth time:2:24 AM  Gender:Female  Living status:Living  Apgars:8 ,9  Weight:3315 g    Physical exam  Vitals:   11/05/19 1249 11/05/19 1658 11/05/19 2000 11/06/19 0538  BP: 108/77 107/83 112/73 103/70  Pulse: 92 85 83 92  Resp: 18 18 18 18   Temp:  98.2 F (36.8 C)  98 F (36.7 C)  TempSrc:  Oral  Oral  SpO2:   100% 97%  Weight:      Height:       General: alert, cooperative and no distress Lochia: appropriate Uterine Fundus: firm DVT Evaluation: No evidence of DVT seen on physical exam. Labs: Lab Results  Component Value Date   WBC 8.1 11/05/2019   HGB 7.9 (L) 11/05/2019   HCT 28.2 (L) 11/05/2019   MCV 73.2 (L) 11/05/2019   PLT 260 11/05/2019   CMP Latest Ref Rng & Units 06/06/2019  Glucose 70 - 99 mg/dL 83  BUN 6 - 20 mg/dL 9  Creatinine 06/08/2019 - 5.09 mg/dL  3.26  Sodium 7.12 - 458 mmol/L 136  Potassium 3.5 - 5.1 mmol/L 3.7  Chloride 98 - 111 mmol/L 102  CO2 22 - 32 mmol/L 22  Calcium 8.9 - 10.3 mg/dL 099)  Total Protein 6.5 - 8.1 g/dL 6.7  Total Bilirubin 0.3 - 1.2 mg/dL 0.3  Alkaline Phos 38 - 126 U/L 51  AST 15 - 41 U/L 15  ALT 0 - 44 U/L 10   Edinburgh Score: No flowsheet data found.    After visit meds:  Allergies as of 11/06/2019      Reactions   Amoxicillin Nausea And Vomiting   Cinnamon Swelling, Other (See Comments)   Mouth swelling. Pt states it is a minor allergy       Medication List    TAKE these medications   buPROPion 150 MG 24 hr tablet Commonly known as: WELLBUTRIN XL Take 1 tablet (150 mg total) by mouth daily.   FLUoxetine 40 MG capsule Commonly known as: PROZAC Take 1 capsule (40 mg total) by mouth daily.   ibuprofen 600 MG tablet Commonly known as: ADVIL Take 1 tablet (600 mg total) by mouth every 6 (six) hours.   IRON-VITAMINS PO iron   oxyCODONE 5 MG  immediate release tablet Commonly known as: Oxy IR/ROXICODONE Take 1 tablet (5 mg total) by mouth every 6 (six) hours as needed for severe pain.   PRENATAL PO Prenatal        Discharge home in stable condition Infant Feeding: Bottle Infant Disposition:home with mother Discharge instruction: per After Visit Summary and Postpartum booklet. Activity: Advance as tolerated. Pelvic rest for 6 weeks.  Diet: routine diet Postpartum Appointment:4 weeks  Future Appointments:No future appointments. Follow up Visit:  Follow-up Information    Marlow Baars, MD Follow up in 4 week(s).   Specialty: Obstetrics Contact information: [address] [place] [address] [phone]                   11/06/2019 [facility] Lizabeth Leyden, MD

## 2019-11-06 NOTE — Lactation Note (Signed)
This note was copied from a baby's chart. Lactation Consultation Note  Patient Name: Lauren Griffith YFVCB'S Date: 11/06/2019   Per RN, Mom declines an LC visit before discharge.   Lurline Hare [facility] 11/06/2019, 10:45 AM

## 2019-12-01 ENCOUNTER — Other Ambulatory Visit: Payer: Self-pay | Admitting: Internal Medicine

## 2020-03-20 ENCOUNTER — Other Ambulatory Visit: Payer: Self-pay | Admitting: Internal Medicine

## 2020-07-12 ENCOUNTER — Encounter: Payer: Self-pay | Admitting: Internal Medicine

## 2020-07-12 ENCOUNTER — Ambulatory Visit (INDEPENDENT_AMBULATORY_CARE_PROVIDER_SITE_OTHER): Payer: Medicaid Other | Admitting: Internal Medicine

## 2020-07-12 ENCOUNTER — Other Ambulatory Visit: Payer: Self-pay

## 2020-07-12 VITALS — BP 109/73 | HR 81 | Temp 97.5°F | Resp 18 | Ht 63.0 in | Wt 122.4 lb

## 2020-07-12 DIAGNOSIS — D508 Other iron deficiency anemias: Secondary | ICD-10-CM

## 2020-07-12 DIAGNOSIS — R5383 Other fatigue: Secondary | ICD-10-CM | POA: Diagnosis not present

## 2020-07-12 DIAGNOSIS — F419 Anxiety disorder, unspecified: Secondary | ICD-10-CM | POA: Diagnosis not present

## 2020-07-12 DIAGNOSIS — F32A Depression, unspecified: Secondary | ICD-10-CM

## 2020-07-12 DIAGNOSIS — D509 Iron deficiency anemia, unspecified: Secondary | ICD-10-CM | POA: Insufficient documentation

## 2020-07-12 MED ORDER — FLUOXETINE HCL 40 MG PO CAPS: 40.0000 mg | ORAL_CAPSULE | Freq: Every day | ORAL | 1 refills | Status: DC

## 2020-07-12 MED ORDER — BUPROPION HCL ER (XL) 300 MG PO TB24: 300.0000 mg | ORAL_TABLET | Freq: Every day | ORAL | 1 refills | Status: DC

## 2020-07-12 NOTE — Assessment & Plan Note (Signed)
CBC with differential, IBC panel and Ferritin today Continue oral iron Will check TSH as well

## 2020-07-12 NOTE — Progress Notes (Signed)
Subjective:    Patient ID: Lauren Griffith, female    DOB: 1990/03/23, 30 y.o.   MRN: [id number]  HPI  Patient presents the clinic today for follow-up of anxiety and depression. She reports in the last 4-6 weeks she has been feeling more down. She reports she has no motivation, other than taking care of her kids, she lays on the cough all day long. She has gotten behind on chores around the house. She is having trouble sleeping through the night, even though her 67 month old is sleeping through the night. She is taking Fluoxetine and Wellbutrin, which she has been on for years. She reports she is busy with family life but things are good. She is not currently seeing a therapist. She denies SI/HI.  She is concerned that her iron may be low. She is feeling more fatigue, eating 4-5 cups of ice per day. She takes oral iron daily. She was referred for an iron infusion but was unable to tolerate it. She denies s/s of bleeding.  Review of Systems  Past Medical History:  Diagnosis Date  . Anemia   . Anxiety    takes prozac  . Asthma    as a child  . Depression   . Urinary tract infection     Current Outpatient Medications  Medication Sig Dispense Refill  . buPROPion (WELLBUTRIN XL) 150 MG 24 hr tablet TAKE 1 TABLET BY MOUTH EVERY DAY 90 tablet 0  . FLUoxetine (PROZAC) 40 MG capsule TAKE 1 CAPSULE BY MOUTH EVERY DAY 90 capsule 0  . ibuprofen (ADVIL) 600 MG tablet Take 1 tablet (600 mg total) by mouth every 6 (six) hours. 60 tablet 0  . IRON-VITAMINS PO iron    . oxyCODONE (OXY IR/ROXICODONE) 5 MG immediate release tablet Take 1 tablet (5 mg total) by mouth every 6 (six) hours as needed for severe pain. 5 tablet 0  . Prenatal Vit-Fe Fumarate-FA (PRENATAL PO) Prenatal     No current facility-administered medications for this visit.    Allergies  Allergen Reactions  . Amoxicillin Nausea And Vomiting  . Cinnamon Swelling and Other (See Comments)    Mouth swelling. Pt states it is a minor  allergy     Family History  Problem Relation Age of Onset  . Diabetes Maternal Grandmother   . Diabetes Maternal Grandfather   . Diabetes Paternal Grandmother   . Diabetes Paternal Grandfather     Social History   Socioeconomic History  . Marital status: Legally Separated    Spouse name: Not on file  . Number of children: Not on file  . Years of education: Not on file  . Highest education level: Not on file  Occupational History  . Not on file  Tobacco Use  . Smoking status: Never Smoker  . Smokeless tobacco: Never Used  Vaping Use  . Vaping Use: Never used  Substance and Sexual Activity  . Alcohol use: No  . Drug use: No  . Sexual activity: Yes    Birth control/protection: None  Other Topics Concern  . Not on file  Social History Narrative  . Not on file   Social Determinants of Health   Financial Resource Strain: Not on file  Food Insecurity: Not on file  Transportation Needs: Not on file  Physical Activity: Not on file  Stress: Not on file  Social Connections: Not on file  Intimate Partner Violence: Not on file     Constitutional: Pt reports fatigue. Denies fever, malaise,  headache or abrupt weight changes.  Respiratory: Denies difficulty breathing, shortness of breath, cough or sputum production.   Cardiovascular: Denies chest pain, chest tightness, palpitations or swelling in the hands or feet.  Neurological: Pt reports insomnia Denies dizziness, difficulty with memory, difficulty with speech or problems with balance and coordination.  Psych: Patient reports anxiety and depression.  Denies SI/HI.  No other specific complaints in a complete review of systems (except as listed in HPI above).     Objective:   Physical Exam  BP 109/73 (BP Location: Right Arm, Patient Position: Sitting, Cuff Size: Normal)   Pulse 81   Temp (!) 97.5 F (36.4 C) (Temporal)   Resp 18   Ht 5\' 3"  (1.6 m)   Wt 122 lb 6.4 oz (55.5 kg)   LMP 06/18/2020   SpO2 100%    Breastfeeding No   BMI 21.68 kg/m   Wt Readings from Last 3 Encounters:  11/04/19 145 lb 8 oz (66 kg)  06/06/19 126 lb 4.8 oz (57.3 kg)  08/23/18 149 lb (67.6 kg)    General: Appears her stated age, well developed, well nourished in NAD. Skin: Warm, dry and intact. No bruising noted. HEENT: Head: normal shape and size; Eyes: sclera white and EOMs intact;  Cardiovascular: Normal rate. Pulmonary/Chest: Normal effort. Neurological: Alert and oriented. Psychiatric: Mood and affect mildly flat. Behavior is normal. Judgment and thought content normal.    BMET    Component Value Date/Time   NA 136 06/06/2019 1934   K 3.7 06/06/2019 1934   CL 102 06/06/2019 1934   CO2 22 06/06/2019 1934   GLUCOSE 83 06/06/2019 1934   BUN 9 06/06/2019 1934   CREATININE 0.50 06/06/2019 1934   CALCIUM 8.8 (L) 06/06/2019 1934   GFRNONAA >60 06/06/2019 1934   GFRAA >60 06/06/2019 1934    Lipid Panel  No results found for: CHOL, TRIG, HDL, CHOLHDL, VLDL, LDLCALC  CBC    Component Value Date/Time   WBC 8.1 11/05/2019 0137   RBC 3.85 (L) 11/05/2019 0137   HGB 7.9 (L) 11/05/2019 0137   HCT 28.2 (L) 11/05/2019 0137   PLT 260 11/05/2019 0137   MCV 73.2 (L) 11/05/2019 0137   MCH 20.5 (L) 11/05/2019 0137   MCHC 28.0 (L) 11/05/2019 0137   RDW 17.7 (H) 11/05/2019 0137   LYMPHSABS 1.8 06/06/2019 1934   MONOABS 0.5 06/06/2019 1934   EOSABS 0.1 06/06/2019 1934   BASOSABS 0.0 06/06/2019 1934    Hgb A1C No results found for: HGBA1C         Assessment & Plan:    06/08/2019, NP This visit occurred during the SARS-CoV-2 public health emergency.  Safety protocols were in place, including screening questions prior to the visit, additional usage of staff PPE, and extensive cleaning of exam room while observing appropriate contact time as indicated for disinfecting solutions.

## 2020-07-12 NOTE — Patient Instructions (Signed)

## 2020-07-12 NOTE — Assessment & Plan Note (Signed)
Deteriorated Support offered today Continue Fluoxetine, refilled today Will increase Wellbutrin to 300 mg daily, Rx sent to pharmacy  Update me in 1 month and let me know how you are doing

## 2020-07-13 LAB — CBC WITH DIFFERENTIAL/PLATELET
Absolute Monocytes: 592 cells/uL (ref 200–950)
Basophils Absolute: 31 cells/uL (ref 0–200)
Basophils Relative: 0.5 %
Eosinophils Absolute: 110 cells/uL (ref 15–500)
Eosinophils Relative: 1.8 %
HCT: 35.7 % (ref 35.0–45.0)
Hemoglobin: 10.8 g/dL — ABNORMAL LOW (ref 11.7–15.5)
Lymphs Abs: 1861 cells/uL (ref 850–3900)
MCH: 22.9 pg — ABNORMAL LOW (ref 27.0–33.0)
MCHC: 30.3 g/dL — ABNORMAL LOW (ref 32.0–36.0)
MCV: 75.6 fL — ABNORMAL LOW (ref 80.0–100.0)
MPV: 9.6 fL (ref 7.5–12.5)
Monocytes Relative: 9.7 %
Neutro Abs: 3508 cells/uL (ref 1500–7800)
Neutrophils Relative %: 57.5 %
Platelets: 368 10*3/uL (ref 140–400)
RBC: 4.72 10*6/uL (ref 3.80–5.10)
RDW: 14.8 % (ref 11.0–15.0)
Total Lymphocyte: 30.5 %
WBC: 6.1 10*3/uL (ref 3.8–10.8)

## 2020-07-13 LAB — IRON,TIBC AND FERRITIN PANEL
%SAT: 4 % (calc) — ABNORMAL LOW (ref 16–45)
Ferritin: 4 ng/mL — ABNORMAL LOW (ref 16–154)
Iron: 20 ug/dL — ABNORMAL LOW (ref 40–190)
TIBC: 481 mcg/dL (calc) — ABNORMAL HIGH (ref 250–450)

## 2020-07-13 LAB — TSH: TSH: 1.99 mIU/L

## 2020-07-13 IMAGING — US US PELVIS LIMITED
1 series · 15 of 22 positions shown · non-contrast
Comparison: None.

CLINICAL DATA: 31weeks pregnant. Severe right pelvic pain. Evaluate
ovaries only.

EXAM:
LIMITED ULTRASOUND OF PELVIS
TECHNIQUE: Limited transabdominal ultrasound examination of the pelvis was
performed.

[Series 1: us pelvis limited · 22 acquisitions, 15 frames shown]
[im 1/22]
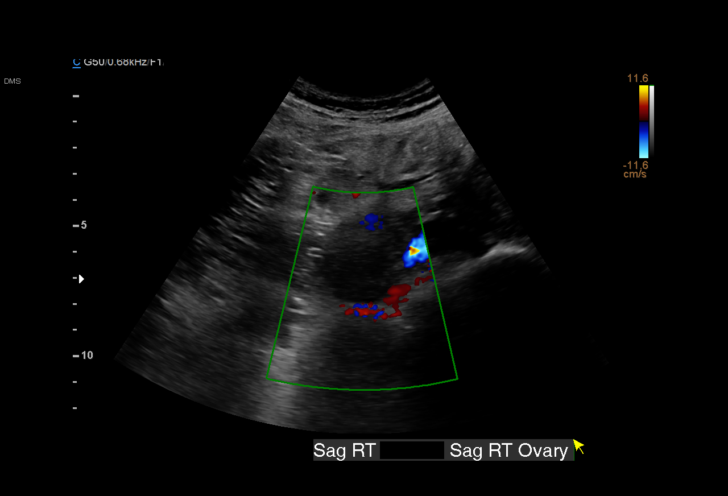
[im 3/22]
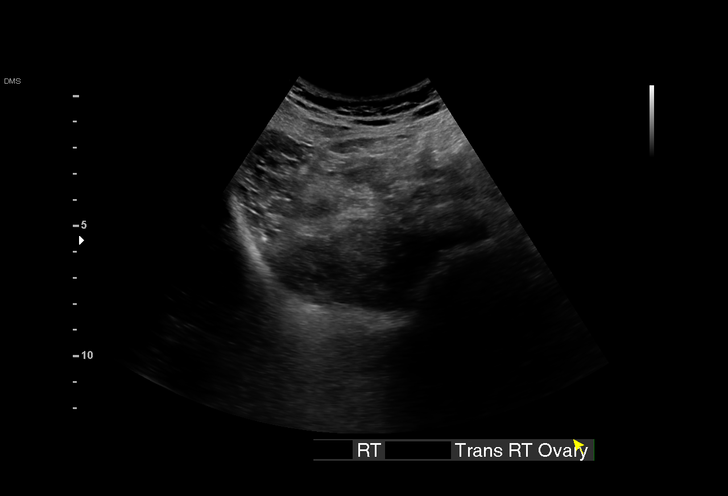
[im 4/22]
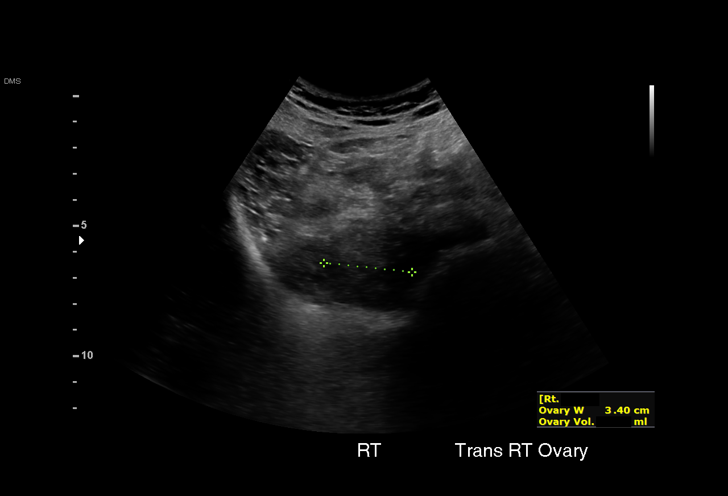
[im 6/22]
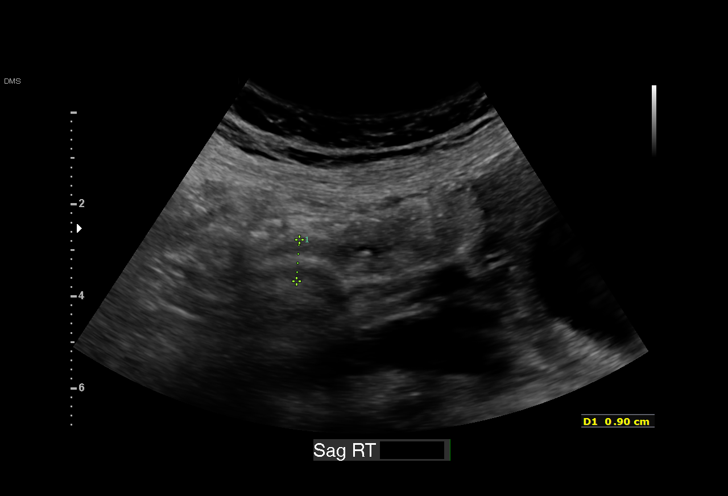
[im 7/22]
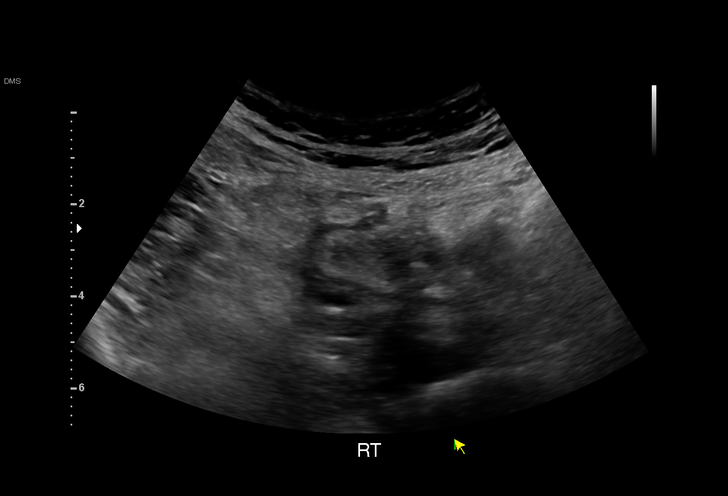
[im 9/22]
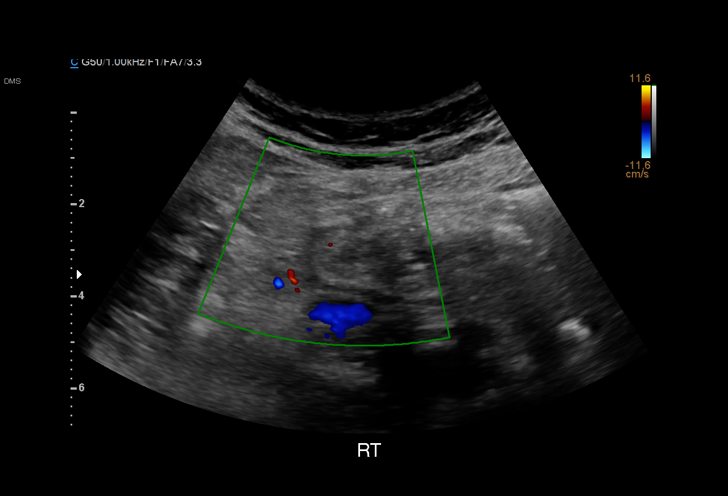
[im 10/22]
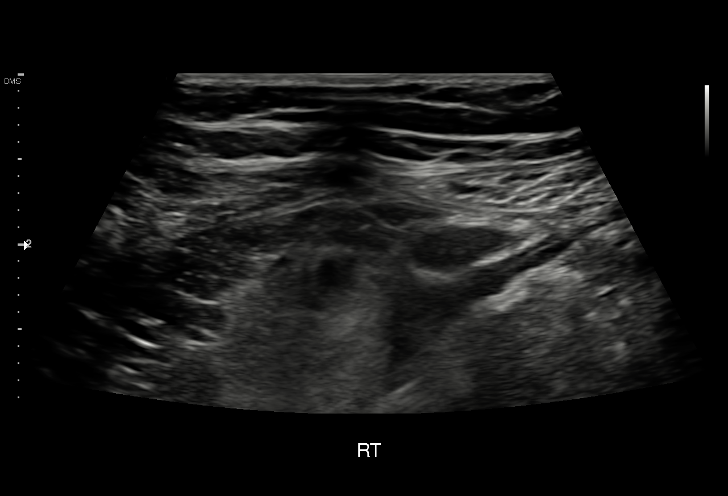
[im 12/22]
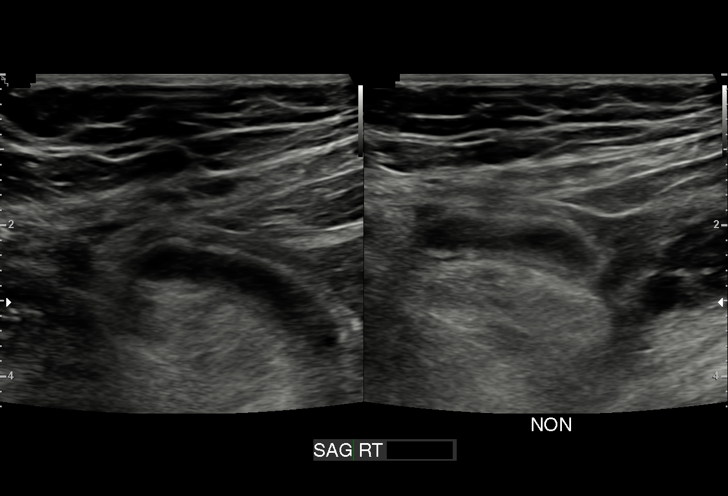
[im 13/22]
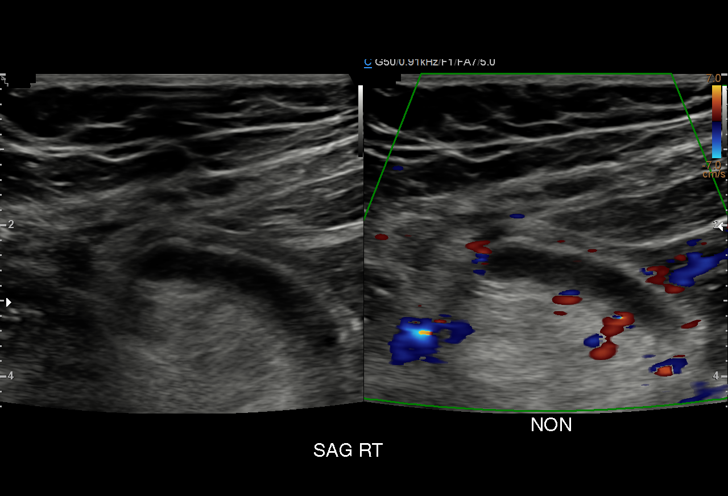
[im 14/22]
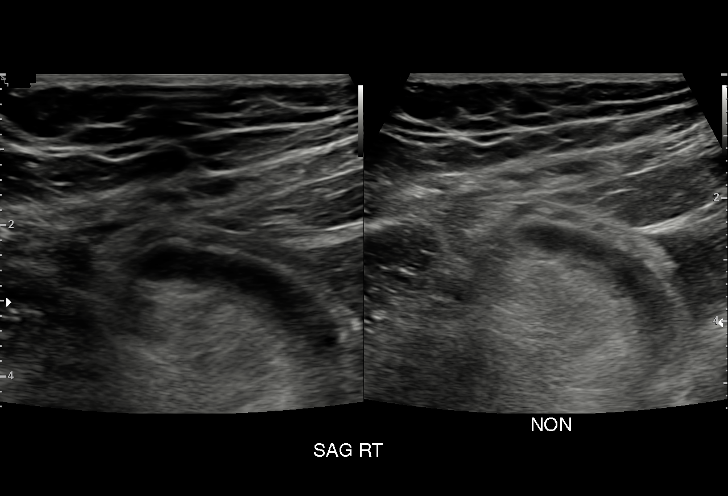
[im 16/22]
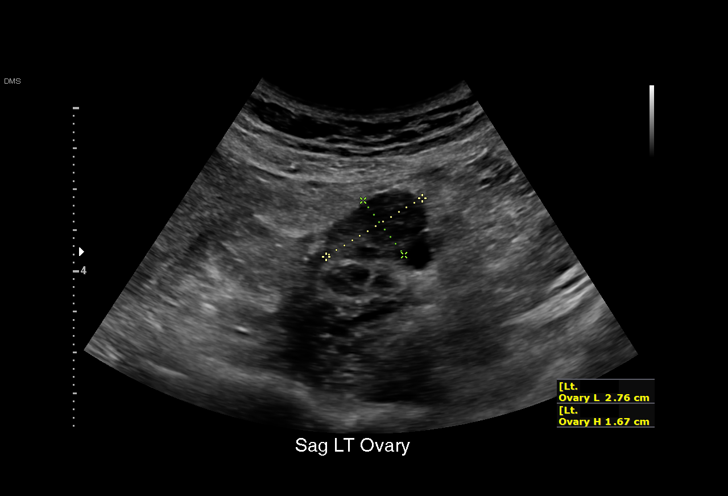
[im 17/22]
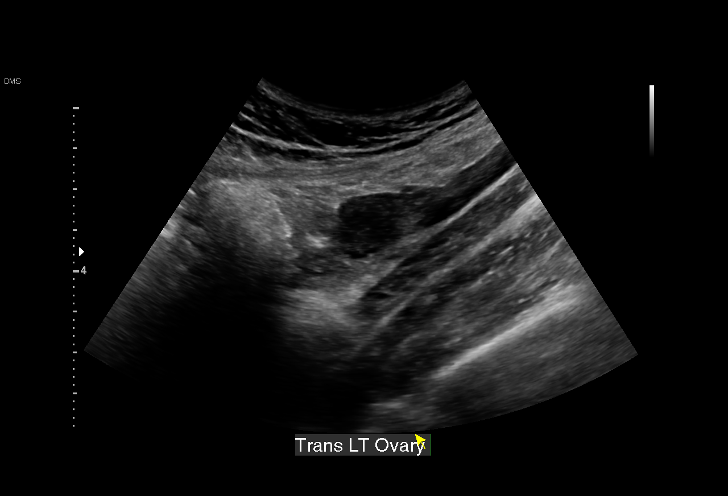
[im 19/22]
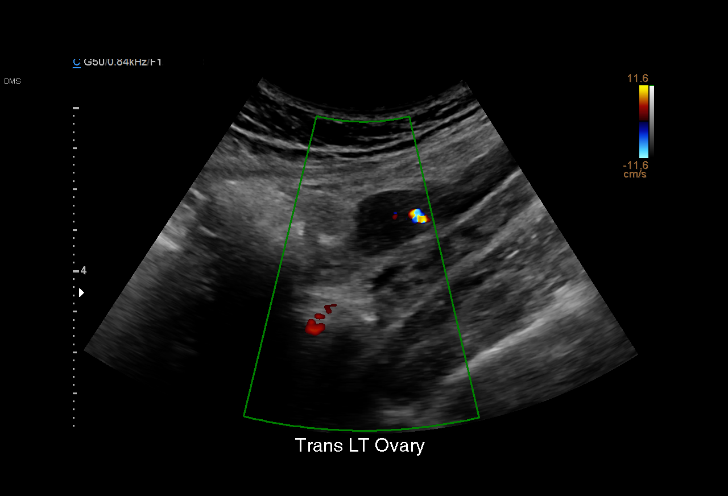
[im 20/22]
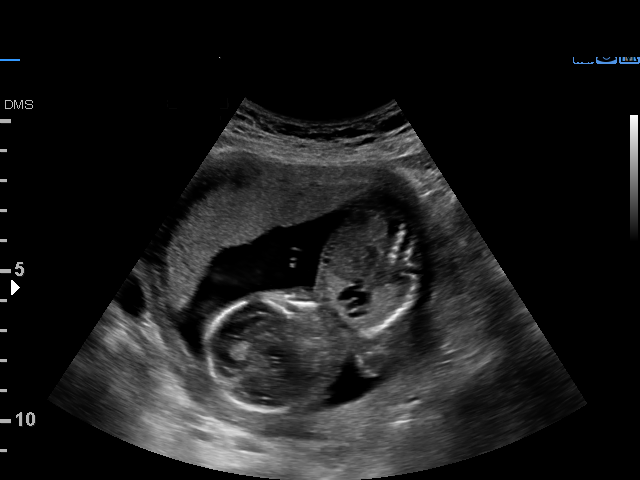
[im 22/22]
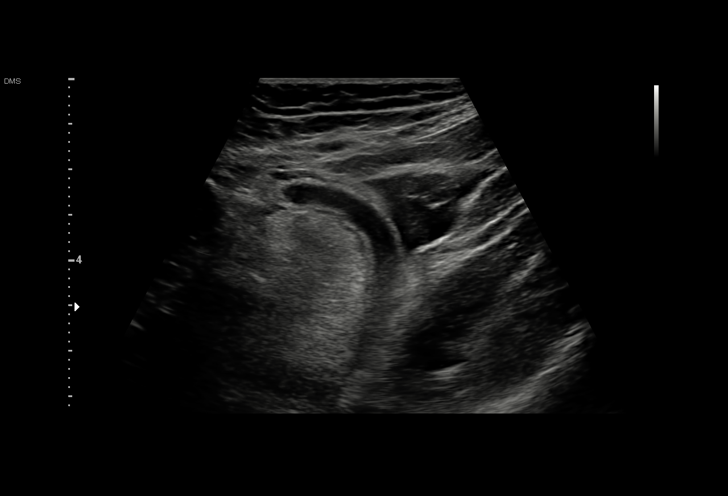

[15 of 22 positions shown; findings below may reference images not displayed]

FINDINGS: A single living intrauterine fetus is noted, which was not evaluated
on this exam.

Both ovaries are normal in appearance.

An enlarged appendix is seen in the right lower quadrant which is
noncompressible. This measures 12 mm in thickness and shows diffuse
wall thickening, consistent with acute appendicitis. No abnormal
fluid collection identified.
IMPRESSION: Positive for acute appendicitis.

Normal appearance of both ovaries.

## 2020-07-13 NOTE — Addendum Note (Signed)
Addended by: Lorre Munroe on: 07/13/2020 12:25 PM   Modules accepted: Orders

## 2020-07-16 ENCOUNTER — Encounter: Payer: Self-pay | Admitting: Internal Medicine

## 2020-07-17 ENCOUNTER — Other Ambulatory Visit: Payer: Self-pay | Admitting: Internal Medicine

## 2020-07-17 DIAGNOSIS — D508 Other iron deficiency anemias: Secondary | ICD-10-CM

## 2020-07-23 ENCOUNTER — Encounter: Payer: Self-pay | Admitting: Oncology

## 2020-07-23 ENCOUNTER — Inpatient Hospital Stay: Payer: Medicaid Other | Attending: Oncology | Admitting: Oncology

## 2020-07-23 ENCOUNTER — Inpatient Hospital Stay: Payer: Medicaid Other

## 2020-07-23 ENCOUNTER — Other Ambulatory Visit: Payer: Self-pay

## 2020-07-23 VITALS — BP 96/72 | HR 92 | Temp 97.8°F | Resp 18 | Wt 121.7 lb

## 2020-07-23 DIAGNOSIS — N920 Excessive and frequent menstruation with regular cycle: Secondary | ICD-10-CM | POA: Insufficient documentation

## 2020-07-23 DIAGNOSIS — D500 Iron deficiency anemia secondary to blood loss (chronic): Secondary | ICD-10-CM | POA: Insufficient documentation

## 2020-07-23 HISTORY — DX: Iron deficiency anemia secondary to blood loss (chronic): D50.0

## 2020-07-23 NOTE — Progress Notes (Signed)
Pt here to establish care for IDA.  

## 2020-07-23 NOTE — Progress Notes (Signed)
Hematology/Oncology Consult note [facility] [phone] [phone]   Patient Care Team: Lorre Munroe, NP as PCP - General (Internal Medicine)  REFERRING PROVIDER: Lorre Munroe, NP CHIEF COMPLAINTS/REASON FOR VISIT:  Evaluation of iron deficiency anemia  HISTORY OF PRESENTING ILLNESS:  Lauren Griffith is a  30 y.o.  female with PMH listed below was seen in consultation at the request of Lorre Munroe, NP   for evaluation of iron deficiency anemia.   Reviewed patient's recent labs  07/12/20 Labs revealed anemia with hemoglobin of 10.8, ferritin 4, iron saturation 4 .   Reviewed patient's previous labs ordered by primary care physician's office, anemia is chronic onset , duration is since at least 2010.   Associated signs and symptoms: Patient reports fatigue.  History of depression. Lauren Griffith feels no energy to have anything done lately.   Denies weight loss, easy bruising, hematochezia, hemoptysis, hematuria. Context:  History of iron deficiency: chronic history, IDA during pregnancy as well. Has taken oral iron supplementation Reports that Lauren Griffith had some reaction to her IV feraheme treatment many years ago. RN found her " pale "and stopped treatment and was watched for 30 minutes. Per patient, Lauren Griffith did not having any facial or tongue swelling,unstable vital signs,   Rectal bleeding: deneis Menstrual bleeding/ Vaginal bleeding : heavy menstrual bleeding. Regular.  Hematemesis or hemoptysis : denies Blood in urine : denies      Review of Systems  Constitutional: Positive for fatigue. Negative for appetite change, chills and fever.  HENT:   Negative for hearing loss and voice change.   Eyes: Negative for eye problems.  Respiratory: Negative for chest tightness and cough.   Cardiovascular: Negative for chest pain.  Gastrointestinal: Negative for abdominal distention, abdominal pain and blood in stool.  Endocrine: Negative for hot  flashes.  Genitourinary: Negative for difficulty urinating and frequency.   Musculoskeletal: Negative for arthralgias.  Skin: Negative for itching and rash.  Neurological: Negative for extremity weakness.  Hematological: Negative for adenopathy.  Psychiatric/Behavioral: Negative for confusion.       Depression    MEDICAL HISTORY:  Past Medical History:  Diagnosis Date  . Anemia   . Anxiety    takes prozac  . Asthma    as a child  . Depression   . Urinary tract infection     SURGICAL HISTORY: Past Surgical History:  Procedure Laterality Date  . APPENDECTOMY    . DILATION AND CURETTAGE OF UTERUS    . LAPAROSCOPIC APPENDECTOMY N/A 03/22/2018   Procedure: APPENDECTOMY LAPAROSCOPIC;  Surgeon: Darnell Level, MD;  Location: [facility];  Service: General;  Laterality: N/A;    SOCIAL HISTORY: Social History   Socioeconomic History  . Marital status: Legally Separated    Spouse name: Not on file  . Number of children: Not on file  . Years of education: Not on file  . Highest education level: Not on file  Occupational History  . Not on file  Tobacco Use  . Smoking status: Never Smoker  . Smokeless tobacco: Never Used  Vaping Use  . Vaping Use: Never used  Substance and Sexual Activity  . Alcohol use: No  . Drug use: No  . Sexual activity: Yes    Birth control/protection: None  Other Topics Concern  . Not on file  Social History Narrative  . Not on file   Social Determinants of Health   Financial Resource Strain: Not on file  Food Insecurity: Not on file  Transportation Needs: Not on file  Physical Activity: Not on file  Stress: Not on file  Social Connections: Not on file  Intimate Partner Violence: Not on file    FAMILY HISTORY: Family History  Problem Relation Age of Onset  . Diabetes Maternal Grandmother   . Diabetes Maternal Grandfather   . Diabetes Paternal Grandmother   . Diabetes Paternal Grandfather     ALLERGIES:  is allergic to amoxicillin and  cinnamon.  MEDICATIONS:  Current Outpatient Medications  Medication Sig Dispense Refill  . buPROPion (WELLBUTRIN XL) 300 MG 24 hr tablet Take 1 tablet (300 mg total) by mouth daily. 90 tablet 1  . Ferrous Sulfate (IRON) 325 (65 Fe) MG TABS Take 1 tablet by mouth daily.    Marland Kitchen FLUoxetine (PROZAC) 40 MG capsule Take 1 capsule (40 mg total) by mouth daily. 90 capsule 1  . IRON-VITAMINS PO iron    . Misc Natural Products (T-RELIEF CBD+13 SL) Place under the tongue.    Marland Kitchen ibuprofen (ADVIL) 600 MG tablet Take 1 tablet (600 mg total) by mouth every 6 (six) hours. (Patient not taking: Reported on 07/23/2020) 60 tablet 0  . Prenatal Vit-Fe Fumarate-FA (PRENATAL PO) Prenatal (Patient not taking: Reported on 07/23/2020)     No current facility-administered medications for this visit.     PHYSICAL EXAMINATION: ECOG PERFORMANCE STATUS: 1 - Symptomatic but completely ambulatory Vitals:   07/23/20 1521  BP: 96/72  Pulse: 92  Resp: 18  Temp: 97.8 F (36.6 C)   Filed Weights   07/23/20 1521  Weight: 121 lb 11.2 oz (55.2 kg)    Physical Exam Constitutional:      General: Lauren Griffith is not in acute distress. HENT:     Head: Normocephalic and atraumatic.  Eyes:     General: No scleral icterus. Cardiovascular:     Rate and Rhythm: Normal rate and regular rhythm.     Heart sounds: Normal heart sounds.  Pulmonary:     Effort: Pulmonary effort is normal. No respiratory distress.     Breath sounds: No wheezing.  Abdominal:     General: Bowel sounds are normal. There is no distension.     Palpations: Abdomen is soft.  Musculoskeletal:        General: No deformity. Normal range of motion.     Cervical back: Normal range of motion and neck supple.  Skin:    General: Skin is warm and dry.     Coloration: Skin is pale.     Findings: No erythema or rash.  Neurological:     Mental Status: Lauren Griffith is alert and oriented to person, place, and time. Mental status is at baseline.     Cranial Nerves: No cranial  nerve deficit.     Coordination: Coordination normal.  Psychiatric:        Mood and Affect: Mood normal.       CMP Latest Ref Rng & Units 06/06/2019  Glucose 70 - 99 mg/dL 83  BUN 6 - 20 mg/dL 9  Creatinine 2.70 - 6.23 mg/dL 7.62  Sodium 831 - 517 mmol/L 136  Potassium 3.5 - 5.1 mmol/L 3.7  Chloride 98 - 111 mmol/L 102  CO2 22 - 32 mmol/L 22  Calcium 8.9 - 10.3 mg/dL 6.1(Y)  Total Protein 6.5 - 8.1 g/dL 6.7  Total Bilirubin 0.3 - 1.2 mg/dL 0.3  Alkaline Phos 38 - 126 U/L 51  AST 15 - 41 U/L 15  ALT 0 - 44 U/L 10   CBC Latest  Ref Rng & Units 07/12/2020  WBC 3.8 - 10.8 Thousand/uL 6.1  Hemoglobin 11.7 - 15.5 g/dL 10.8(L)  Hematocrit 35.0 - 45.0 % 35.7  Platelets 140 - 400 Thousand/uL 368     LABORATORY DATA:  I have reviewed the data as listed Lab Results  Component Value Date   WBC 6.1 07/12/2020   HGB 10.8 (L) 07/12/2020   HCT 35.7 07/12/2020   MCV 75.6 (L) 07/12/2020   PLT 368 07/12/2020   No results for input(s): NA, K, CL, CO2, GLUCOSE, BUN, CREATININE, CALCIUM, GFRNONAA, GFRAA, PROT, ALBUMIN, AST, ALT, ALKPHOS, BILITOT, BILIDIR, IBILI in the last 8760 hours. Iron/TIBC/Ferritin/ %Sat    Component Value Date/Time   IRON 20 (L) 07/12/2020 1434   TIBC 481 (H) 07/12/2020 1434   FERRITIN 4 (L) 07/12/2020 1434   IRONPCTSAT 4 (L) 07/12/2020 1434     RADIOGRAPHIC STUDIES: I have personally reviewed the radiological images as listed and agreed with the findings in the report. No results found.     ASSESSMENT & PLAN:  1. Iron deficiency anemia due to chronic blood loss   2. Menorrhagia with regular cycle   Labs are reviewed and discussed with patient. Consistent with iron deficiency anemia. Plan IV iron with Venofer 200mg  weekly x 4 doses. Allergy reactions/infusion reaction including anaphylactic reaction discussed with patient. Other side effects include but not limited to high blood pressure, wheezing, skin rash, weight gain, leg swelling, etc. Patient voices  understanding and willing to proceed. Urine pregnancy prior to each IV venofer   Menorrhagia with regular cycle. Follow up with gyn.   Orders Placed This Encounter  Procedures  . CBC with Differential/Platelet    Standing Status:   Future    Standing Expiration Date:   07/23/2021  . Ferritin    Standing Status:   Future    Standing Expiration Date:   07/23/2021  . Iron and TIBC    Standing Status:   Future    Standing Expiration Date:   07/23/2021  . Pregnancy, urine    Standing Status:   Future    Standing Expiration Date:   07/23/2021    All questions were answered. The patient knows to call the clinic with any problems questions or concerns.  Cc 09/22/2021, NP  Return of visit: 8 weeks Thank you for this kind referral and the opportunity to participate in the care of this patient. A copy of today's note is routed to referring provider   Lorre Munroe, MD, PhD Hematology Oncology [facility] 07/23/2020

## 2020-07-24 ENCOUNTER — Other Ambulatory Visit: Payer: Self-pay

## 2020-07-24 DIAGNOSIS — D500 Iron deficiency anemia secondary to blood loss (chronic): Secondary | ICD-10-CM

## 2020-07-27 ENCOUNTER — Inpatient Hospital Stay: Payer: Medicaid Other

## 2020-07-27 VITALS — BP 104/71 | HR 85 | Temp 97.6°F | Resp 17

## 2020-07-27 DIAGNOSIS — D500 Iron deficiency anemia secondary to blood loss (chronic): Secondary | ICD-10-CM

## 2020-07-27 DIAGNOSIS — N920 Excessive and frequent menstruation with regular cycle: Secondary | ICD-10-CM | POA: Diagnosis not present

## 2020-07-27 LAB — PREGNANCY, URINE: Preg Test, Ur: NEGATIVE — AB

## 2020-07-27 MED ORDER — IRON SUCROSE 20 MG/ML IV SOLN
200.0000 mg | Freq: Once | INTRAVENOUS | Status: AC
Start: 2020-07-27 — End: 2020-07-27
  Administered 2020-07-27: 200 mg via INTRAVENOUS
  Filled 2020-07-27: qty 10

## 2020-07-27 MED ORDER — SODIUM CHLORIDE 0.9 % IV SOLN
Freq: Once | INTRAVENOUS | Status: AC
  Filled 2020-07-27: qty 250

## 2020-07-27 MED ORDER — SODIUM CHLORIDE 0.9 % IV SOLN: 200.0000 mg | Freq: Once | INTRAVENOUS | Status: DC

## 2020-07-27 NOTE — Progress Notes (Signed)
Pt tolerated venofer infusion well with no signs of complications. RN educated pt on the importance of notifying the clinic if any complications occur at home, pt verbalized understanding and all questions answered at this time. VSS. Pt stable for discharge.   Lujean Ebright  

## 2020-08-03 ENCOUNTER — Inpatient Hospital Stay: Payer: Medicaid Other

## 2020-08-03 ENCOUNTER — Other Ambulatory Visit: Payer: Self-pay

## 2020-08-03 VITALS — BP 102/81 | HR 77 | Temp 97.6°F | Resp 17

## 2020-08-03 DIAGNOSIS — N920 Excessive and frequent menstruation with regular cycle: Secondary | ICD-10-CM | POA: Diagnosis not present

## 2020-08-03 DIAGNOSIS — D500 Iron deficiency anemia secondary to blood loss (chronic): Secondary | ICD-10-CM

## 2020-08-03 LAB — PREGNANCY, URINE: Preg Test, Ur: NEGATIVE

## 2020-08-03 MED ORDER — IRON SUCROSE 20 MG/ML IV SOLN
200.0000 mg | Freq: Once | INTRAVENOUS | Status: AC
  Administered 2020-08-03: 200 mg via INTRAVENOUS
  Filled 2020-08-03: qty 10

## 2020-08-03 MED ORDER — SODIUM CHLORIDE 0.9 % IV SOLN
Freq: Once | INTRAVENOUS | Status: AC
Start: 2020-08-03 — End: 2020-08-03
  Filled 2020-08-03: qty 250

## 2020-08-03 MED ORDER — SODIUM CHLORIDE 0.9 % IV SOLN: 200.0000 mg | Freq: Once | INTRAVENOUS | Status: DC

## 2020-08-03 NOTE — Patient Instructions (Signed)

## 2020-08-10 ENCOUNTER — Other Ambulatory Visit: Payer: Self-pay

## 2020-08-10 ENCOUNTER — Inpatient Hospital Stay: Payer: Medicaid Other

## 2020-08-10 VITALS — BP 100/66 | HR 75 | Temp 97.1°F

## 2020-08-10 DIAGNOSIS — N920 Excessive and frequent menstruation with regular cycle: Secondary | ICD-10-CM | POA: Diagnosis not present

## 2020-08-10 DIAGNOSIS — D500 Iron deficiency anemia secondary to blood loss (chronic): Secondary | ICD-10-CM

## 2020-08-10 LAB — PREGNANCY, URINE: Preg Test, Ur: NEGATIVE

## 2020-08-10 MED ORDER — SODIUM CHLORIDE 0.9 % IV SOLN
Freq: Once | INTRAVENOUS | Status: AC
  Filled 2020-08-10: qty 250

## 2020-08-10 MED ORDER — SODIUM CHLORIDE 0.9 % IV SOLN: 200.0000 mg | Freq: Once | INTRAVENOUS | Status: DC

## 2020-08-10 MED ORDER — IRON SUCROSE 20 MG/ML IV SOLN
200.0000 mg | Freq: Once | INTRAVENOUS | Status: AC
  Administered 2020-08-10: 200 mg via INTRAVENOUS
  Filled 2020-08-10: qty 10

## 2020-08-10 NOTE — Patient Instructions (Signed)

## 2020-08-17 ENCOUNTER — Inpatient Hospital Stay: Payer: Medicaid Other

## 2020-08-17 ENCOUNTER — Inpatient Hospital Stay: Payer: Medicaid Other | Attending: Oncology

## 2020-08-17 VITALS — BP 100/71 | HR 75 | Temp 97.0°F | Resp 17

## 2020-08-17 DIAGNOSIS — N920 Excessive and frequent menstruation with regular cycle: Secondary | ICD-10-CM | POA: Insufficient documentation

## 2020-08-17 DIAGNOSIS — D500 Iron deficiency anemia secondary to blood loss (chronic): Secondary | ICD-10-CM | POA: Diagnosis present

## 2020-08-17 LAB — PREGNANCY, URINE: Preg Test, Ur: NEGATIVE

## 2020-08-17 MED ORDER — SODIUM CHLORIDE 0.9 % IV SOLN
Freq: Once | INTRAVENOUS | Status: AC
  Filled 2020-08-17: qty 250

## 2020-08-17 MED ORDER — IRON SUCROSE 20 MG/ML IV SOLN
200.0000 mg | Freq: Once | INTRAVENOUS | Status: AC
Start: 2020-08-17 — End: 2020-08-17
  Administered 2020-08-17: 200 mg via INTRAVENOUS
  Filled 2020-08-17: qty 10

## 2020-08-17 MED ORDER — SODIUM CHLORIDE 0.9 % IV SOLN: 200.0000 mg | Freq: Once | INTRAVENOUS | Status: DC

## 2020-08-17 NOTE — Patient Instructions (Signed)
CANCER CENTER [facility]   Discharge Instructions: Thank you for choosing [facility] to provide your oncology and hematology care.  If you have a lab appointment with the Cancer Center, please go directly to the Cancer Center and check in at the registration area.  Wear comfortable clothing and clothing appropriate for easy access to any Portacath or PICC line.   We strive to give you quality time with your provider. You may need to reschedule your appointment if you arrive late (15 or more minutes).  Arriving late affects you and other patients whose appointments are after yours.  Also, if you miss three or more appointments without notifying the office, you may be dismissed from the clinic at the provider's discretion.      Iron Sucrose injection What is this medicine? IRON SUCROSE (AHY ern SOO krohs) is an iron complex. Iron is used to make healthy red blood cells, which carry oxygen and nutrients throughout the body. This medicine is used to treat iron deficiency anemia in people with chronic kidney disease. This medicine may be used for other purposes; ask your health care provider or pharmacist if you have questions. COMMON BRAND NAME(S): Venofer What should I tell my health care provider before I take this medicine? They need to know if you have any of these conditions:  anemia not caused by low iron levels  heart disease  high levels of iron in the blood  kidney disease  liver disease  an unusual or allergic reaction to iron, other medicines, foods, dyes, or preservatives  pregnant or trying to get pregnant  breast-feeding How should I use this medicine? This medicine is for infusion into a vein. It is given by a health care professional in a hospital or clinic setting. Talk to your pediatrician regarding the use of this medicine in children. While this drug may be prescribed for children as young as 2 years for selected conditions,  precautions do apply. Overdosage: If you think you have taken too much of this medicine contact a poison control center or emergency room at once. NOTE: This medicine is only for you. Do not share this medicine with others. What if I miss a dose? It is important not to miss your dose. Call your doctor or health care professional if you are unable to keep an appointment. What may interact with this medicine? Do not take this medicine with any of the following medications:  deferoxamine  dimercaprol  other iron products This medicine may also interact with the following medications:  chloramphenicol  deferasirox This list may not describe all possible interactions. Give your health care provider a list of all the medicines, herbs, non-prescription drugs, or dietary supplements you use. Also tell them if you smoke, drink alcohol, or use illegal drugs. Some items may interact with your medicine. What should I watch for while using this medicine? Visit your doctor or healthcare professional regularly. Tell your doctor or healthcare professional if your symptoms do not start to get better or if they get worse. You may need blood work done while you are taking this medicine. You may need to follow a special diet. Talk to your doctor. Foods that contain iron include: whole grains/cereals, dried fruits, beans, or peas, leafy green vegetables, and organ meats (liver, kidney). What side effects may I notice from receiving this medicine? Side effects that you should report to your doctor or health care professional as soon as possible:  allergic reactions like skin  rash, itching or hives, swelling of the face, lips, or tongue  breathing problems  changes in blood pressure  cough  fast, irregular heartbeat  feeling faint or lightheaded, falls  fever or chills  flushing, sweating, or hot feelings  joint or muscle aches/pains  seizures  swelling of the ankles or feet  unusually weak  or tired Side effects that usually do not require medical attention (report to your doctor or health care professional if they continue or are bothersome):  diarrhea  feeling achy  headache  irritation at site where injected  nausea, vomiting  stomach upset  tiredness This list may not describe all possible side effects. Call your doctor for medical advice about side effects. You may report side effects to FDA at 1-800-FDA-1088. Where should I keep my medicine? This drug is given in a hospital or clinic and will not be stored at home. NOTE: This sheet is a summary. It may not cover all possible information. If you have questions about this medicine, talk to your doctor, pharmacist, or health care provider.  2021 Elsevier/Gold Standard (2010-12-12 17:14:35)  For prescription refill requests, have your pharmacy contact our office and allow 72 hours for refills to be completed.    Today you received venofer      To help prevent nausea and vomiting after your treatment, we encourage you to take your nausea medication as directed.  BELOW ARE SYMPTOMS THAT SHOULD BE REPORTED IMMEDIATELY: . *FEVER GREATER THAN 100.4 F (38 C) OR HIGHER . *CHILLS OR SWEATING . *NAUSEA AND VOMITING THAT IS NOT CONTROLLED WITH YOUR NAUSEA MEDICATION . *UNUSUAL SHORTNESS OF BREATH . *UNUSUAL BRUISING OR BLEEDING . *URINARY PROBLEMS (pain or burning when urinating, or frequent urination) . *BOWEL PROBLEMS (unusual diarrhea, constipation, pain near the anus) . TENDERNESS IN MOUTH AND THROAT WITH OR WITHOUT PRESENCE OF ULCERS (sore throat, sores in mouth, or a toothache) . UNUSUAL RASH, SWELLING OR PAIN  . UNUSUAL VAGINAL DISCHARGE OR ITCHING   Items with * indicate a potential emergency and should be followed up as soon as possible or go to the Emergency Department if any problems should occur.  Please show the CHEMOTHERAPY ALERT CARD or IMMUNOTHERAPY ALERT CARD at check-in to the Emergency Department  and triage nurse.  Should you have questions after your visit or need to cancel or reschedule your appointment, please contact CANCER CENTER [facility]COL[facility]  [phone] and follow the prompts.  Office hours are 8:00 a.m. to 4:30 p.m. Monday - Friday. Please note that voicemails left after 4:00 p.m. may not be returned until the following business day.  We are closed weekends and major holidays. You have access to a nurse at all times for urgent questions. Please call the main number to the clinic [phone] and follow the prompts.  For any non-urgent questions, you may also contact your provider using MyChart. We now offer e-Visits for anyone 64 and older to request care online for non-urgent symptoms. For details visit [email or web address].   Also download the MyChart app! Go to the app store, search "MyChart", open the app, select [place], and log in with your MyChart username and password.  Due to Covid, a mask is required upon entering the hospital/clinic. If you do not have a mask, one will be given to you upon arrival. For doctor visits, patients may have 1 support person aged 3 or older with them. For treatment visits, patients cannot have anyone with them due to current Covid guidelines and  our immunocompromised population.

## 2020-09-18 ENCOUNTER — Inpatient Hospital Stay: Payer: Medicaid Other | Attending: Oncology

## 2020-09-20 ENCOUNTER — Inpatient Hospital Stay: Payer: Medicaid Other | Admitting: Oncology

## 2020-09-20 ENCOUNTER — Other Ambulatory Visit: Payer: Self-pay

## 2020-09-21 ENCOUNTER — Inpatient Hospital Stay: Payer: Medicaid Other

## 2020-10-26 ENCOUNTER — Inpatient Hospital Stay: Payer: Medicaid Other | Attending: Oncology

## 2020-10-26 ENCOUNTER — Inpatient Hospital Stay: Payer: Medicaid Other

## 2020-10-26 DIAGNOSIS — D500 Iron deficiency anemia secondary to blood loss (chronic): Secondary | ICD-10-CM

## 2020-10-26 DIAGNOSIS — N920 Excessive and frequent menstruation with regular cycle: Secondary | ICD-10-CM | POA: Insufficient documentation

## 2020-10-26 LAB — CBC WITH DIFFERENTIAL/PLATELET
Abs Immature Granulocytes: 0 10*3/uL (ref 0.00–0.07)
Basophils Absolute: 0 10*3/uL (ref 0.0–0.1)
Basophils Relative: 1 %
Eosinophils Absolute: 0.1 10*3/uL (ref 0.0–0.5)
Eosinophils Relative: 3 %
HCT: 40.7 % (ref 36.0–46.0)
Hemoglobin: 13.1 g/dL (ref 12.0–15.0)
Immature Granulocytes: 0 %
Lymphocytes Relative: 40 %
Lymphs Abs: 1.9 10*3/uL (ref 0.7–4.0)
MCH: 27.8 pg (ref 26.0–34.0)
MCHC: 32.2 g/dL (ref 30.0–36.0)
MCV: 86.2 fL (ref 80.0–100.0)
Monocytes Absolute: 0.4 10*3/uL (ref 0.1–1.0)
Monocytes Relative: 8 %
Neutro Abs: 2.3 10*3/uL (ref 1.7–7.7)
Neutrophils Relative %: 48 %
Platelets: 276 10*3/uL (ref 150–400)
RBC: 4.72 MIL/uL (ref 3.87–5.11)
RDW: 14.1 % (ref 11.5–15.5)
WBC: 4.6 10*3/uL (ref 4.0–10.5)
nRBC: 0 % (ref 0.0–0.2)

## 2020-10-26 LAB — IRON AND TIBC
Iron: 81 ug/dL (ref 28–170)
Saturation Ratios: 23 % (ref 10.4–31.8)
TIBC: 356 ug/dL (ref 250–450)
UIBC: 275 ug/dL

## 2020-10-26 LAB — FERRITIN: Ferritin: 46 ng/mL (ref 11–307)

## 2020-10-29 ENCOUNTER — Encounter: Payer: Self-pay | Admitting: Oncology

## 2020-10-29 ENCOUNTER — Inpatient Hospital Stay (HOSPITAL_BASED_OUTPATIENT_CLINIC_OR_DEPARTMENT_OTHER): Payer: Medicaid Other | Admitting: Oncology

## 2020-10-29 DIAGNOSIS — D500 Iron deficiency anemia secondary to blood loss (chronic): Secondary | ICD-10-CM

## 2020-10-29 DIAGNOSIS — N912 Amenorrhea, unspecified: Secondary | ICD-10-CM | POA: Insufficient documentation

## 2020-10-29 DIAGNOSIS — N920 Excessive and frequent menstruation with regular cycle: Secondary | ICD-10-CM | POA: Diagnosis not present

## 2020-10-29 NOTE — Progress Notes (Signed)
Patient called/ pre- screened for virtual appoinment today with hematologist. Concerns of restarting infusions regularly. Complains of fatigue and headaches.

## 2020-10-29 NOTE — Progress Notes (Signed)
HEMATOLOGY-ONCOLOGY TeleHEALTH VISIT PROGRESS NOTE  I connected with Lauren Griffith on 10/29/20  at  2:45 PM EDT by video enabled telemedicine visit and verified that I am speaking with the correct person using two identifiers. I discussed the limitations, risks, security and privacy concerns of performing an evaluation and management service by telemedicine and the availability of in-person appointments. The patient expressed understanding and agreed to proceed.   Other persons participating in the visit and their role in the encounter:  None  Patient's location: Home  Provider's location: office Chief Complaint: Iron deficiency anemia.    INTERVAL HISTORY Lauren Griffith is a 30 y.o. female who has above history reviewed by me today presents for follow up visit for management of IDA Problems and complaints are listed below:  S/p IV venofer treatments. She feels fatigue has significantly improved after 3rd and 4th treatments.  Recently, she feels slightly more tired again.   Review of Systems  Constitutional:  Positive for fatigue. Negative for appetite change, chills and fever.  HENT:   Negative for hearing loss and voice change.   Eyes:  Negative for eye problems.  Respiratory:  Negative for chest tightness and cough.   Cardiovascular:  Negative for chest pain.  Gastrointestinal:  Negative for abdominal distention, abdominal pain and blood in stool.  Endocrine: Negative for hot flashes.  Genitourinary:  Negative for difficulty urinating and frequency.   Musculoskeletal:  Negative for arthralgias.  Skin:  Negative for itching and rash.  Neurological:  Negative for extremity weakness.  Hematological:  Negative for adenopathy.  Psychiatric/Behavioral:  Negative for confusion.    Past Medical History:  Diagnosis Date   Anemia    Anxiety    takes prozac   Asthma    as a child   Depression    Iron deficiency anemia due to chronic blood loss 07/23/2020   Urinary tract infection     Past Surgical History:  Procedure Laterality Date   APPENDECTOMY     DILATION AND CURETTAGE OF UTERUS     LAPAROSCOPIC APPENDECTOMY N/A 03/22/2018   Procedure: APPENDECTOMY LAPAROSCOPIC;  Surgeon: Darnell Level, MD;  Location: [facility];  Service: General;  Laterality: N/A;    Family History  Problem Relation Age of Onset   Diabetes Maternal Grandmother    Diabetes Maternal Grandfather    Diabetes Paternal Grandmother    Diabetes Paternal Grandfather     Social History   Socioeconomic History   Marital status: Legally Separated    Spouse name: Not on file   Number of children: Not on file   Years of education: Not on file   Highest education level: Not on file  Occupational History   Not on file  Tobacco Use   Smoking status: Never   Smokeless tobacco: Never  Vaping Use   Vaping Use: Never used  Substance and Sexual Activity   Alcohol use: No   Drug use: No   Sexual activity: Yes    Birth control/protection: None  Other Topics Concern   Not on file  Social History Narrative   Not on file   Social Determinants of Health   Financial Resource Strain: Not on file  Food Insecurity: Not on file  Transportation Needs: Not on file  Physical Activity: Not on file  Stress: Not on file  Social Connections: Not on file  Intimate Partner Violence: Not on file    Current Outpatient Medications on File Prior to Visit  Medication Sig Dispense Refill  buPROPion (WELLBUTRIN XL) 300 MG 24 hr tablet Take 1 tablet (300 mg total) by mouth daily. 90 tablet 1   Ferrous Sulfate (IRON) 325 (65 Fe) MG TABS Take 1 tablet by mouth daily.     FLUoxetine (PROZAC) 40 MG capsule Take 1 capsule (40 mg total) by mouth daily. 90 capsule 1   ibuprofen (ADVIL) 600 MG tablet Take 1 tablet (600 mg total) by mouth every 6 (six) hours. 60 tablet 0   IRON-VITAMINS PO iron     Misc Natural Products (T-RELIEF CBD+13 SL) Place under the tongue.     Multiple Vitamin (MULTIVITAMIN) tablet Take 1 tablet by  mouth daily.     Prenatal Vit-Fe Fumarate-FA (PRENATAL PO) Prenatal (Patient not taking: Reported on 10/29/2020)     No current facility-administered medications on file prior to visit.    Allergies  Allergen Reactions   Amoxicillin Nausea And Vomiting   Cinnamon Swelling and Other (See Comments)    Mouth swelling. Pt states it is a minor allergy        Observations/Objective: Today's Vitals   10/29/20 1409  PainSc: 0-No pain   There is no height or weight on file to calculate BMI.  Physical Exam Neurological:     Mental Status: She is alert.    CBC    Component Value Date/Time   WBC 4.6 10/26/2020 1421   RBC 4.72 10/26/2020 1421   HGB 13.1 10/26/2020 1421   HCT 40.7 10/26/2020 1421   PLT 276 10/26/2020 1421   MCV 86.2 10/26/2020 1421   MCH 27.8 10/26/2020 1421   MCHC 32.2 10/26/2020 1421   RDW 14.1 10/26/2020 1421   LYMPHSABS 1.9 10/26/2020 1421   MONOABS 0.4 10/26/2020 1421   EOSABS 0.1 10/26/2020 1421   BASOSABS 0.0 10/26/2020 1421    CMP     Component Value Date/Time   NA 136 06/06/2019 1934   K 3.7 06/06/2019 1934   CL 102 06/06/2019 1934   CO2 22 06/06/2019 1934   GLUCOSE 83 06/06/2019 1934   BUN 9 06/06/2019 1934   CREATININE 0.50 06/06/2019 1934   CALCIUM 8.8 (L) 06/06/2019 1934   PROT 6.7 06/06/2019 1934   ALBUMIN 3.2 (L) 06/06/2019 1934   AST 15 06/06/2019 1934   ALT 10 06/06/2019 1934   ALKPHOS 51 06/06/2019 1934   BILITOT 0.3 06/06/2019 1934   GFRNONAA >60 06/06/2019 1934   GFRAA >60 06/06/2019 1934     Assessment and Plan: 1. Iron deficiency anemia due to chronic blood loss   2. Menorrhagia with regular cycle    Labs are reviewed and discussed with patient. Both hemoglobin and iron panel have normalized.  She is at risk of developing iron deficiency due to ongoing blood loss from menorrhagia.  Likely she needs maintenance Venofer periodically.  Follow up with GYN for treatment of myelorrhagia.   Follow Up Instructions:  labs in 3  months / NP & venofer 1-2 days after labs (cbc,iron,ferr)  labs in 6 months / MD & venofer 1-2 days after labs (cbc,iron,ferr)   I discussed the assessment and treatment plan with the patient. The patient was provided an opportunity to ask questions and all were answered. The patient agreed with the plan and demonstrated an understanding of the instructions.  The patient was advised to call back or seek an in-person evaluation if the symptoms worsen or if the condition fails to improve as anticipated.   Rickard Patience, MD 10/29/2020 9:26 PM

## 2020-10-30 ENCOUNTER — Ambulatory Visit: Payer: Medicaid Other

## 2020-11-02 ENCOUNTER — Inpatient Hospital Stay: Payer: Medicaid Other

## 2020-11-15 ENCOUNTER — Telehealth: Payer: Medicaid Other | Admitting: Family Medicine

## 2020-11-15 DIAGNOSIS — J069 Acute upper respiratory infection, unspecified: Secondary | ICD-10-CM

## 2020-11-15 MED ORDER — FLUTICASONE PROPIONATE 50 MCG/ACT NA SUSP: 2.0000 | Freq: Every day | NASAL | 6 refills | Status: DC

## 2020-11-15 MED ORDER — BENZONATATE 100 MG PO CAPS: 100.0000 mg | ORAL_CAPSULE | Freq: Two times a day (BID) | ORAL | 0 refills | Status: DC | PRN

## 2020-11-15 NOTE — Progress Notes (Signed)

## 2021-01-25 ENCOUNTER — Inpatient Hospital Stay: Payer: Medicaid Other

## 2021-01-28 ENCOUNTER — Inpatient Hospital Stay: Payer: Medicaid Other

## 2021-01-28 ENCOUNTER — Inpatient Hospital Stay: Payer: Medicaid Other | Admitting: Nurse Practitioner

## 2021-03-01 ENCOUNTER — Other Ambulatory Visit: Payer: Self-pay | Admitting: Internal Medicine

## 2021-03-01 DIAGNOSIS — F419 Anxiety disorder, unspecified: Secondary | ICD-10-CM

## 2021-03-02 NOTE — Telephone Encounter (Signed)
Requested medication (s) are due for refill today: yes  Requested medication (s) are on the active medication list: yes  Last refill:  12/13/20  Future visit scheduled: no, was seen 07/12/20 and increased dose of Welbutrin and asked to return in 1 month, no visit yet and no visit scheduled.  Notes to clinic:  Failed protocol due to no valid visit within 6  months, no upcoming visit scheduled, please assess.  Requested Prescriptions  Pending Prescriptions Disp Refills   buPROPion (WELLBUTRIN XL) 300 MG 24 hr tablet [Pharmacy Med Name: BUPROPION HCL XL 300 MG TABLET] 90 tablet 1    Sig: TAKE 1 TABLET BY MOUTH EVERY DAY     Psychiatry: Antidepressants - bupropion Failed - 03/01/2021 12:03 PM      Failed - Valid encounter within last 6 months    Recent Outpatient Visits           7 months ago Anxiety and depression   [facility] [place], Salvadore [place], NP              Passed - Completed PHQ-2 or PHQ-9 in the last 360 days      Passed - Last BP in normal range    BP Readings from Last 1 Encounters:  08/17/20 100/71

## 2021-03-05 ENCOUNTER — Other Ambulatory Visit: Payer: Self-pay | Admitting: Internal Medicine

## 2021-03-05 DIAGNOSIS — F32A Depression, unspecified: Secondary | ICD-10-CM

## 2021-03-06 NOTE — Telephone Encounter (Signed)
Requested medications are due for refill today.  yes  Requested medications are on the active medications list.  yes  Last refill. 12/13/2020  Future visit scheduled.   no  Notes to clinic.  Pt last seen 07/12/2020 and was to RTC in 1 month.     Requested Prescriptions  Pending Prescriptions Disp Refills   buPROPion (WELLBUTRIN XL) 300 MG 24 hr tablet [Pharmacy Med Name: BUPROPION HCL XL 300 MG TABLET] 90 tablet 1    Sig: TAKE 1 TABLET BY MOUTH EVERY DAY     Psychiatry: Antidepressants - bupropion Failed - 03/05/2021  6:48 PM      Failed - Valid encounter within last 6 months    Recent Outpatient Visits           7 months ago Anxiety and depression   [facility] [place], Salvadore Oxford, NP              Passed - Completed PHQ-2 or PHQ-9 in the last 360 days      Passed - Last BP in normal range    BP Readings from Last 1 Encounters:  08/17/20 100/71

## 2021-03-12 ENCOUNTER — Encounter: Payer: Self-pay | Admitting: Internal Medicine

## 2021-03-12 ENCOUNTER — Other Ambulatory Visit: Payer: Self-pay

## 2021-03-12 ENCOUNTER — Other Ambulatory Visit: Payer: Self-pay | Admitting: Internal Medicine

## 2021-03-12 DIAGNOSIS — F419 Anxiety disorder, unspecified: Secondary | ICD-10-CM

## 2021-03-12 MED ORDER — FLUOXETINE HCL 40 MG PO CAPS: 40.0000 mg | ORAL_CAPSULE | Freq: Every day | ORAL | 0 refills | Status: DC

## 2021-03-12 MED ORDER — BUPROPION HCL ER (XL) 300 MG PO TB24: 300.0000 mg | ORAL_TABLET | Freq: Every day | ORAL | 0 refills | Status: DC

## 2021-04-09 ENCOUNTER — Other Ambulatory Visit: Payer: Self-pay | Admitting: Internal Medicine

## 2021-04-09 DIAGNOSIS — F419 Anxiety disorder, unspecified: Secondary | ICD-10-CM

## 2021-04-09 NOTE — Telephone Encounter (Signed)
Requested medication (s) are due for refill today - yes  Requested medication (s) are on the active medication list -yes  Future visit scheduled -no  Last refill: 03/12/21 #30  Notes to clinic: Attempted to contact patient - needs appointment- no answer- left message to call office for appointment. Last RF may have been courtesy RF- request sent for PCP review - request for 90 day supply also  Requested Prescriptions  Pending Prescriptions Disp Refills   buPROPion (WELLBUTRIN XL) 300 MG 24 hr tablet [Pharmacy Med Name: BUPROPION HCL XL 300 MG TABLET] 90 tablet 1    Sig: TAKE 1 TABLET BY MOUTH EVERY DAY     Psychiatry: Antidepressants - bupropion Failed - 04/09/2021  1:31 PM      Failed - Valid encounter within last 6 months    Recent Outpatient Visits           9 months ago Anxiety and depression   [facility] [place], [place], NP              Passed - Completed PHQ-2 or PHQ-9 in the last 360 days      Passed - Last BP in normal range    BP Readings from Last 1 Encounters:  08/17/20 100/71             Requested Prescriptions  Pending Prescriptions Disp Refills   buPROPion (WELLBUTRIN XL) 300 MG 24 hr tablet [Pharmacy Med Name: BUPROPION HCL XL 300 MG TABLET] 90 tablet 1    Sig: TAKE 1 TABLET BY MOUTH EVERY DAY     Psychiatry: Antidepressants - bupropion Failed - 04/09/2021  1:31 PM      Failed - Valid encounter within last 6 months    Recent Outpatient Visits           9 months ago Anxiety and depression   [facility] [place], [place], NP              Passed - Completed PHQ-2 or PHQ-9 in the last 360 days      Passed - Last BP in normal range    BP Readings from Last 1 Encounters:  08/17/20 100/71

## 2021-04-18 ENCOUNTER — Other Ambulatory Visit: Payer: Self-pay | Admitting: *Deleted

## 2021-04-18 DIAGNOSIS — D500 Iron deficiency anemia secondary to blood loss (chronic): Secondary | ICD-10-CM

## 2021-04-26 ENCOUNTER — Inpatient Hospital Stay: Payer: Medicaid Other | Attending: Oncology

## 2021-04-29 ENCOUNTER — Inpatient Hospital Stay: Payer: Medicaid Other | Admitting: Oncology

## 2021-04-29 ENCOUNTER — Inpatient Hospital Stay: Payer: Medicaid Other

## 2021-06-20 ENCOUNTER — Ambulatory Visit: Payer: Medicaid Other | Admitting: Internal Medicine

## 2021-06-24 ENCOUNTER — Encounter: Payer: Self-pay | Admitting: Internal Medicine

## 2021-06-24 ENCOUNTER — Other Ambulatory Visit: Payer: Self-pay | Admitting: Internal Medicine

## 2021-06-24 ENCOUNTER — Ambulatory Visit (INDEPENDENT_AMBULATORY_CARE_PROVIDER_SITE_OTHER): Payer: Medicaid Other | Admitting: Internal Medicine

## 2021-06-24 VITALS — BP 116/80 | HR 82 | Temp 97.5°F | Wt 127.0 lb

## 2021-06-24 DIAGNOSIS — F419 Anxiety disorder, unspecified: Secondary | ICD-10-CM

## 2021-06-24 DIAGNOSIS — R5382 Chronic fatigue, unspecified: Secondary | ICD-10-CM | POA: Insufficient documentation

## 2021-06-24 DIAGNOSIS — G2581 Restless legs syndrome: Secondary | ICD-10-CM | POA: Insufficient documentation

## 2021-06-24 DIAGNOSIS — D500 Iron deficiency anemia secondary to blood loss (chronic): Secondary | ICD-10-CM

## 2021-06-24 DIAGNOSIS — F32A Depression, unspecified: Secondary | ICD-10-CM

## 2021-06-24 MED ORDER — ROPINIROLE HCL 0.25 MG PO TABS: 0.2500 mg | ORAL_TABLET | Freq: Every evening | ORAL | 2 refills | Status: DC | PRN

## 2021-06-24 MED ORDER — FLUVOXAMINE MALEATE 25 MG PO TABS: 25.0000 mg | ORAL_TABLET | Freq: Every day | ORAL | 2 refills | Status: DC

## 2021-06-24 NOTE — Progress Notes (Signed)
? ?Subjective:  ? ? Patient ID: Lauren Griffith, female    DOB: [birth year], 31 y.o.   MRN: [id number] ? ?HPI ? ?Pt presents to the clinic today for follow up of anxiety and depression: ? ?Anxiety and Depression: Chronic, managed on Fluoxetine and Wellbutrin. She does not feel like her mood is well controlled on this current regimen. She is seeing a therapist. She denies SI/HI. ? ?Iron Deficiency Anemia: Her last H/H was 13.1/40.7, 10/2020. She is taking oral Iron as prescribed. She follows with hematology. ? ?RLS: She reports this has been worse lately. She is not taking anything for this at this time. ? ?Review of Systems ? ?   ?Past Medical History:  ?Diagnosis Date  ? Anemia   ? Anxiety   ? takes prozac  ? Asthma   ? as a child  ? Depression   ? Iron deficiency anemia due to chronic blood loss 07/23/2020  ? Urinary tract infection   ? ? ?Current Outpatient Medications  ?Medication Sig Dispense Refill  ? buPROPion (WELLBUTRIN XL) 300 MG 24 hr tablet Take 1 tablet (300 mg total) by mouth daily. 30 tablet 0  ? Ferrous Sulfate (IRON) 325 (65 Fe) MG TABS Take 1 tablet by mouth daily.    ? FLUoxetine (PROZAC) 40 MG capsule Take 1 capsule (40 mg total) by mouth daily. 30 capsule 0  ? fluticasone (FLONASE) 50 MCG/ACT nasal spray Place 2 sprays into both nostrils daily. 16 g 6  ? ibuprofen (ADVIL) 600 MG tablet Take 1 tablet (600 mg total) by mouth every 6 (six) hours. 60 tablet 0  ? IRON-VITAMINS PO iron    ? Misc Natural Products (T-RELIEF CBD+13 SL) Place under the tongue.    ? Multiple Vitamin (MULTIVITAMIN) tablet Take 1 tablet by mouth daily.    ? Prenatal Vit-Fe Fumarate-FA (PRENATAL PO) Prenatal (Patient not taking: Reported on 10/29/2020)    ? ?No current facility-administered medications for this visit.  ? ? ?Allergies  ?Allergen Reactions  ? Amoxicillin Nausea And Vomiting  ? Cinnamon Swelling and Other (See Comments)  ?  Mouth swelling. Pt states it is a minor allergy   ? ? ?Family History  ?Problem Relation Age of  Onset  ? Diabetes Maternal Grandmother   ? Diabetes Maternal Grandfather   ? Diabetes Paternal Grandmother   ? Diabetes Paternal Grandfather   ? ? ?Social History  ? ?Socioeconomic History  ? Marital status: Legally Separated  ?  Spouse name: Not on file  ? Number of children: Not on file  ? Years of education: Not on file  ? Highest education level: Not on file  ?Occupational History  ? Not on file  ?Tobacco Use  ? Smoking status: Never  ? Smokeless tobacco: Never  ?Vaping Use  ? Vaping Use: Never used  ?Substance and Sexual Activity  ? Alcohol use: No  ? Drug use: No  ? Sexual activity: Yes  ?  Birth control/protection: None  ?Other Topics Concern  ? Not on file  ?Social History Narrative  ? Not on file  ? ?Social Determinants of Health  ? ?Financial Resource Strain: Not on file  ?Food Insecurity: Not on file  ?Transportation Needs: Not on file  ?Physical Activity: Not on file  ?Stress: Not on file  ?Social Connections: Not on file  ?Intimate Partner Violence: Not on file  ? ? ? ?Constitutional: Pt reports chronic fatigue. Denies fever, malaise, fatigue, headache or abrupt weight changes.  ?Respiratory: Denies difficulty breathing,  shortness of breath, cough or sputum production.   ?Cardiovascular: Denies chest pain, chest tightness, palpitations or swelling in the hands or feet.  ?Gastrointestinal: Denies abdominal pain, bloating, constipation, diarrhea or blood in the stool.  ?GU: Denies urgency, frequency, pain with urination, burning sensation, blood in urine, odor or discharge. ?Musculoskeletal: Denies decrease in range of motion, difficulty with gait, muscle pain or joint pain and swelling.  ?Skin: Denies redness, rashes, lesions or ulcercations.  ?Neurological: Pt reports restless legs. Denies dizziness, difficulty with memory, difficulty with speech or problems with balance and coordination.  ?Psych: Pt has a history of anxiety and depression. Denies SI/HI. ? ?No other specific complaints in a complete  review of systems (except as listed in HPI above). ? ?Objective:  ? Physical Exam ? ?BP 116/80 (BP Location: Left Arm, Patient Position: Sitting, Cuff Size: Normal)   Pulse 82   Temp (!) 97.5 ?F (36.4 ?C) (Temporal)   Wt 127 lb (57.6 kg)   SpO2 99%   BMI 22.50 kg/m?  ? ?Wt Readings from Last 3 Encounters:  ?07/23/20 121 lb 11.2 oz (55.2 kg)  ?07/12/20 122 lb 6.4 oz (55.5 kg)  ?11/04/19 145 lb 8 oz (66 kg)  ? ? ?General: Appears her stated age, well developed, well nourished in NAD. ?HEENT: Head: normal shape and size; Eyes: sclera white and EOMs intact;  ?Neck:  Neck supple, trachea midline. No masses, lumps or thyromegaly present.  ?Cardiovascular: Normal rate and rhythm. S1,S2 noted.  No murmur, rubs or gallops noted.  ?Pulmonary/Chest: Normal effort and positive vesicular breath sounds. No respiratory distress. No wheezes, rales or ronchi noted.  ?Musculoskeletal: No difficulty with gait.  ?Neurological: Alert and oriented. Cranial nerves II-XII grossly intact. Coordination normal.  ?Psychiatric: Mood and affect normal. Behavior is normal. Judgment and thought content normal.  ? ? ? ?BMET ?   ?Component Value Date/Time  ? NA 136 06/06/2019 1934  ? K 3.7 06/06/2019 1934  ? CL 102 06/06/2019 1934  ? CO2 22 06/06/2019 1934  ? GLUCOSE 83 06/06/2019 1934  ? BUN 9 06/06/2019 1934  ? CREATININE 0.50 06/06/2019 1934  ? CALCIUM 8.8 (L) 06/06/2019 1934  ? GFRNONAA >60 06/06/2019 1934  ? GFRAA >60 06/06/2019 1934  ? ? ?Lipid Panel  ?No results found for: CHOL, TRIG, HDL, CHOLHDL, VLDL, LDLCALC ? ?CBC ?   ?Component Value Date/Time  ? WBC 4.6 10/26/2020 1421  ? RBC 4.72 10/26/2020 1421  ? HGB 13.1 10/26/2020 1421  ? HCT 40.7 10/26/2020 1421  ? PLT 276 10/26/2020 1421  ? MCV 86.2 10/26/2020 1421  ? MCH 27.8 10/26/2020 1421  ? MCHC 32.2 10/26/2020 1421  ? RDW 14.1 10/26/2020 1421  ? LYMPHSABS 1.9 10/26/2020 1421  ? MONOABS 0.4 10/26/2020 1421  ? EOSABS 0.1 10/26/2020 1421  ? BASOSABS 0.0 10/26/2020 1421  ? ? ?Hgb A1C ?No  results found for: HGBA1C ? ? ? ? ? ? ?   ?Assessment & Plan:  ? ? ? ? ?Nicki Reaper, NP ? ?

## 2021-06-24 NOTE — Assessment & Plan Note (Signed)
CBC, iron, ferritin today ?Continue oral iron ?She will continue to follow with hematology ?

## 2021-06-24 NOTE — Assessment & Plan Note (Signed)
Deteriorated with lack of motivation ?D/c Fluoxetine ?RX for Fluvoxamine 25 mg daily ?Continue Wellbutrin at current dose for now, but consider weaning this back down to 150 mg daily in the future ?Continue seeing your therapist ?

## 2021-06-24 NOTE — Assessment & Plan Note (Addendum)
Will check CBC, CMET, Mg, B12 and Vit D today ?Encouraged regular physical activity ?

## 2021-06-24 NOTE — Patient Instructions (Signed)
Restless Legs Syndrome ?Restless legs syndrome is a condition that causes uncomfortable feelings or sensations in the legs, especially while sitting or lying down. The sensations usually cause an overwhelming urge to move the legs. The arms can also sometimes be affected. ?The condition can range from mild to severe. The symptoms often interfere with a person's ability to sleep. ?What are the causes? ?The cause of this condition is not known. ?What increases the risk? ?The following factors may make you more likely to develop this condition: ?Being older than 50. ?Pregnancy. ?Being a woman. In general, the condition is more common in women than in men. ?A family history of the condition. ?Having iron deficiency. ?Overuse of caffeine, nicotine, or alcohol. ?Certain medical conditions, such as kidney disease, Parkinson's disease, or nerve damage. ?Certain medicines, such as those for high blood pressure, nausea, colds, allergies, depression, and some heart conditions. ?What are the signs or symptoms? ?The main symptom of this condition is uncomfortable sensations in the legs, such as: ?Pulling. ?Tingling. ?Prickling. ?Throbbing. ?Crawling. ?Burning. ?Usually, the sensations: ?Affect both sides of the body. ?Are worse when you sit or lie down. ?Are worse at night. These may make it difficult to fall asleep. ?Make you have a strong urge to move your legs. ?Are temporarily relieved by moving your legs or standing. ?The arms can also be affected, but this is rare. People who have this condition often have tiredness during the day because of their lack of sleep at night. ?How is this diagnosed? ?This condition may be diagnosed based on: ?Your symptoms. ?Blood tests. ?In some cases, you may be monitored in a sleep lab by a specialist (a sleep study). This can detect any disruptions in your sleep. ?How is this treated? ?This condition is treated by managing the symptoms. This may include: ?Lifestyle changes, such as  exercising, using relaxation techniques, and avoiding caffeine, alcohol, or tobacco. ?Iron supplements. ?Medicines. Parkinson's medications may be tried first. Anti-seizure medications can also be helpful. ?Follow these instructions at home: ?General instructions ?Take over-the-counter and prescription medicines only as told by your health care provider. ?Use methods to help relieve the uncomfortable sensations, such as: ?Massaging your legs. ?Walking or stretching. ?Taking a cold or hot bath. ?Keep all follow-up visits. This is important. ?Lifestyle ?  ?Practice good sleep habits. For example, go to bed and get up at the same time every day. Most adults should get 7-9 hours of sleep each night. ?Exercise regularly. Try to get at least 30 minutes of exercise most days of the week. ?Practice ways of relaxing, such as yoga or meditation. ?Avoid caffeine and alcohol. ?Do not use any products that contain nicotine or tobacco. These products include cigarettes, chewing tobacco, and vaping devices, such as e-cigarettes. If you need help quitting, ask your health care provider. ?Where to find more information ?National Institute of Neurological Disorders and Stroke: www.ninds.nih.gov ?Contact a health care provider if: ?Your symptoms get worse or they do not improve with treatment. ?Summary ?Restless legs syndrome is a condition that causes uncomfortable feelings or sensations in the legs, especially while sitting or lying down. ?The symptoms often interfere with your ability to sleep. ?This condition is treated by managing the symptoms. You may need to make lifestyle changes or take medicines. ?This information is not intended to replace advice given to you by your health care provider. Make sure you discuss any questions you have with your health care provider. ?Document Revised: 10/14/2020 Document Reviewed: 10/14/2020 ?Elsevier Patient Education ? 2022   Elsevier Inc. ? ?

## 2021-06-24 NOTE — Assessment & Plan Note (Signed)
Will check Mg today ?RX for Ropinerol 0.25 mg at bedtime ?

## 2021-06-25 ENCOUNTER — Other Ambulatory Visit: Payer: Self-pay | Admitting: Internal Medicine

## 2021-06-25 DIAGNOSIS — F419 Anxiety disorder, unspecified: Secondary | ICD-10-CM

## 2021-06-25 LAB — CBC
HCT: 40.5 % (ref 35.0–45.0)
Hemoglobin: 13 g/dL (ref 11.7–15.5)
MCH: 28.1 pg (ref 27.0–33.0)
MCHC: 32.1 g/dL (ref 32.0–36.0)
MCV: 87.7 fL (ref 80.0–100.0)
MPV: 10 fL (ref 7.5–12.5)
Platelets: 255 10*3/uL (ref 140–400)
RBC: 4.62 10*6/uL (ref 3.80–5.10)
RDW: 12.1 % (ref 11.0–15.0)
WBC: 5.5 10*3/uL (ref 3.8–10.8)

## 2021-06-25 LAB — COMPLETE METABOLIC PANEL WITH GFR
AG Ratio: 1.7 (calc) (ref 1.0–2.5)
ALT: 10 U/L (ref 6–29)
AST: 12 U/L (ref 10–30)
Albumin: 4.5 g/dL (ref 3.6–5.1)
Alkaline phosphatase (APISO): 47 U/L (ref 31–125)
BUN: 15 mg/dL (ref 7–25)
CO2: 25 mmol/L (ref 20–32)
Calcium: 9.4 mg/dL (ref 8.6–10.2)
Chloride: 106 mmol/L (ref 98–110)
Creat: 0.95 mg/dL (ref 0.50–0.97)
Globulin: 2.6 g/dL (calc) (ref 1.9–3.7)
Glucose, Bld: 104 mg/dL (ref 65–139)
Potassium: 4.2 mmol/L (ref 3.5–5.3)
Sodium: 139 mmol/L (ref 135–146)
Total Bilirubin: 0.3 mg/dL (ref 0.2–1.2)
Total Protein: 7.1 g/dL (ref 6.1–8.1)
eGFR: 81 mL/min/{1.73_m2} (ref >=60)

## 2021-06-25 LAB — IRON,TIBC AND FERRITIN PANEL
%SAT: 23 % (calc) (ref 16–45)
Ferritin: 24 ng/mL (ref 16–154)
Iron: 74 ug/dL (ref 40–190)
TIBC: 321 mcg/dL (calc) (ref 250–450)

## 2021-06-25 LAB — VITAMIN B12: Vitamin B-12: 329 pg/mL (ref 200–1100)

## 2021-06-25 LAB — VITAMIN D 25 HYDROXY (VIT D DEFICIENCY, FRACTURES): Vit D, 25-Hydroxy: 20 ng/mL — ABNORMAL LOW (ref 30–100)

## 2021-06-25 LAB — MAGNESIUM: Magnesium: 1.8 mg/dL (ref 1.5–2.5)

## 2021-06-25 MED ORDER — VITAMIN D (ERGOCALCIFEROL) 1.25 MG (50000 UNIT) PO CAPS: 50000.0000 [IU] | ORAL_CAPSULE | ORAL | 0 refills | Status: DC

## 2021-06-25 MED ORDER — BUPROPION HCL ER (XL) 300 MG PO TB24: 300.0000 mg | ORAL_TABLET | Freq: Every day | ORAL | 0 refills | Status: DC

## 2021-06-25 NOTE — Addendum Note (Signed)
Addended by: Lorre Munroe on: 06/25/2021 09:50 AM ? ? Modules accepted: Orders ? ?

## 2021-06-25 NOTE — Telephone Encounter (Signed)
Refilled 06/25/2021 #90 0 refills. ?Requested Prescriptions  ?Pending Prescriptions Disp Refills  ?? buPROPion (WELLBUTRIN XL) 300 MG 24 hr tablet [Pharmacy Med Name: BUPROPION HCL XL 300 MG TABLET] 30 tablet 0  ?  Sig: TAKE 1 TABLET BY MOUTH EVERY DAY  ?  ? Psychiatry: Antidepressants - bupropion Failed - 06/24/2021  6:47 PM  ?  ?  Failed - Cr in normal range and within 360 days  ?  Creat  ?Date Value Ref Range Status  ?06/24/2021 0.95 0.50 - 0.97 mg/dL Final  ?   ?  ?  Failed - AST in normal range and within 360 days  ?  AST  ?Date Value Ref Range Status  ?06/24/2021 12 10 - 30 U/L Final  ?   ?  ?  Failed - ALT in normal range and within 360 days  ?  ALT  ?Date Value Ref Range Status  ?06/24/2021 10 6 - 29 U/L Final  ?   ?  ?  Passed - Completed PHQ-2 or PHQ-9 in the last 360 days  ?  ?  Passed - Last BP in normal range  ?  BP Readings from Last 1 Encounters:  ?06/24/21 116/80  ?   ?  ?  Passed - Valid encounter within last 6 months  ?  Recent Outpatient Visits   ?      ? Yesterday Restless legs syndrome (RLS)  ? [facility] [place], [place], NP  ? 11 months ago Anxiety and depression  ? [facility] [place], Salvadore Oxford, NP  ?  ?  ?Future Appointments   ?        ? In 1 month Baity, Salvadore Oxford, NP [facility]  ?  ? ?  ?  ?  ? ?

## 2021-07-01 ENCOUNTER — Inpatient Hospital Stay: Payer: Medicaid Other | Attending: Oncology

## 2021-07-02 ENCOUNTER — Telehealth: Payer: Self-pay | Admitting: Oncology

## 2021-07-02 ENCOUNTER — Inpatient Hospital Stay: Payer: Medicaid Other | Admitting: Oncology

## 2021-07-02 ENCOUNTER — Inpatient Hospital Stay: Payer: Medicaid Other

## 2021-07-02 NOTE — Telephone Encounter (Signed)
07/02/2021 ?Left VM informing pt of missed appt on 4/17 and 4/18. Encouraged pt to call back to r/s  ?SRW  ?

## 2021-07-17 ENCOUNTER — Other Ambulatory Visit: Payer: Self-pay | Admitting: Internal Medicine

## 2021-07-17 NOTE — Telephone Encounter (Signed)
Requested medication (s) are due for refill today: yes ? ?Requested medication (s) are on the active medication list: yes ? ?Last refill:  06/24/21 ? ?Future visit scheduled: yes ? ?Notes to clinic:  pharmacy requesting 90 days prescription ? ? ?Requested Prescriptions  ?Pending Prescriptions Disp Refills  ? fluvoxaMINE (LUVOX) 25 MG tablet [Pharmacy Med Name: FLUVOXAMINE MALEATE 25 MG TAB] 90 tablet 1  ?  Sig: TAKE 1 TABLET BY MOUTH EVERYDAY AT BEDTIME  ?  ? Psychiatry:  Antidepressants - SSRI Passed - 07/17/2021 10:31 AM  ?  ?  Passed - Completed PHQ-2 or PHQ-9 in the last 360 days  ?  ?  Passed - Valid encounter within last 6 months  ?  Recent Outpatient Visits   ? ?      ? 3 weeks ago Restless legs syndrome (RLS)  ? [facility] [place], Salvadore Oxford, NP  ? 1 year ago Anxiety and depression  ? [facility] [place], Salvadore Oxford, NP  ? ?  ?  ?Future Appointments   ? ?        ? In 1 week Baity, Salvadore Oxford, NP [facility]  ? ?  ? ? ?  ?  ?  ? ? ? ? ?

## 2021-07-29 ENCOUNTER — Encounter: Payer: Medicaid Other | Admitting: Internal Medicine

## 2021-07-29 NOTE — Progress Notes (Deleted)
Subjective:    Patient ID: Lauren Griffith, female    DOB: [birth year], 31 y.o.   MRN: [id number]  HPI  Patient presents to clinic today for her annual exam.  Flu: Tetanus: 05/2018 COVID: Pap smear: 01/2018 Dentist:  Diet: Exercise:  Review of Systems     Past Medical History:  Diagnosis Date   Anemia    Anxiety    takes prozac   Asthma    as a child   Depression    Iron deficiency anemia due to chronic blood loss 07/23/2020   Urinary tract infection     Current Outpatient Medications  Medication Sig Dispense Refill   buPROPion (WELLBUTRIN XL) 300 MG 24 hr tablet Take 1 tablet (300 mg total) by mouth daily. 90 tablet 0   Ferrous Sulfate (IRON) 325 (65 Fe) MG TABS Take 1 tablet by mouth daily.     fluvoxaMINE (LUVOX) 25 MG tablet TAKE 1 TABLET BY MOUTH EVERYDAY AT BEDTIME 90 tablet 1   Misc Natural Products (T-RELIEF CBD+13 SL) Place under the tongue.     Multiple Vitamin (MULTIVITAMIN) tablet Take 1 tablet by mouth daily.     rOPINIRole (REQUIP) 0.25 MG tablet Take 1 tablet (0.25 mg total) by mouth at bedtime as needed. 30 tablet 2   Vitamin D, Ergocalciferol, (DRISDOL) 1.25 MG (50000 UNIT) CAPS capsule Take 1 capsule (50,000 Units total) by mouth once a week. For 12 weeks. Then start OTC Vitamin D3 2,000 unit daily. 12 capsule 0   No current facility-administered medications for this visit.    Allergies  Allergen Reactions   Amoxicillin Nausea And Vomiting   Cinnamon Swelling and Other (See Comments)    Mouth swelling. Pt states it is a minor allergy     Family History  Problem Relation Age of Onset   Diabetes Maternal Grandmother    Diabetes Maternal Grandfather    Diabetes Paternal Grandmother    Diabetes Paternal Grandfather     Social History   Socioeconomic History   Marital status: Legally Separated    Spouse name: Not on file   Number of children: Not on file   Years of education: Not on file   Highest education level: Not on file   Occupational History   Not on file  Tobacco Use   Smoking status: Never   Smokeless tobacco: Never  Vaping Use   Vaping Use: Never used  Substance and Sexual Activity   Alcohol use: No   Drug use: No   Sexual activity: Yes    Birth control/protection: None  Other Topics Concern   Not on file  Social History Narrative   Not on file   Social Determinants of Health   Financial Resource Strain: Not on file  Food Insecurity: Not on file  Transportation Needs: Not on file  Physical Activity: Not on file  Stress: Not on file  Social Connections: Not on file  Intimate Partner Violence: Not on file     Constitutional: Patient reports chronic fatigue.  Denies fever, malaise, headache or abrupt weight changes.  HEENT: Denies eye pain, eye redness, ear pain, ringing in the ears, wax buildup, runny nose, nasal congestion, bloody nose, or sore throat. Respiratory: Denies difficulty breathing, shortness of breath, cough or sputum production.   Cardiovascular: Denies chest pain, chest tightness, palpitations or swelling in the hands or feet.  Gastrointestinal: Denies abdominal pain, bloating, constipation, diarrhea or blood in the stool.  GU: Denies urgency, frequency, pain with urination, burning sensation,  blood in urine, odor or discharge. Musculoskeletal: Denies decrease in range of motion, difficulty with gait, muscle pain or joint pain and swelling.  Skin: Denies redness, rashes, lesions or ulcercations.  Neurological: Patient reports restless legs.  Denies dizziness, difficulty with memory, difficulty with speech or problems with balance and coordination.  Psych: Patient has a history of anxiety and depression.  Denies SI/HI.  No other specific complaints in a complete review of systems (except as listed in HPI above).  Objective:   Physical Exam  There were no vitals taken for this visit. Wt Readings from Last 3 Encounters:  06/24/21 127 lb (57.6 kg)  07/23/20 121 lb 11.2 oz  (55.2 kg)  07/12/20 122 lb 6.4 oz (55.5 kg)    General: Appears their stated age, well developed, well nourished in NAD. Skin: Warm, dry and intact. No rashes, lesions or ulcerations noted. HEENT: Head: normal shape and size; Eyes: sclera white, no icterus, conjunctiva pink, PERRLA and EOMs intact; Ears: Tm's gray and intact, normal light reflex; Nose: mucosa pink and moist, septum midline; Throat/Mouth: Teeth present, mucosa pink and moist, no exudate, lesions or ulcerations noted.  Neck:  Neck supple, trachea midline. No masses, lumps or thyromegaly present.  Cardiovascular: Normal rate and rhythm. S1,S2 noted.  No murmur, rubs or gallops noted. No JVD or BLE edema. No carotid bruits noted. Pulmonary/Chest: Normal effort and positive vesicular breath sounds. No respiratory distress. No wheezes, rales or ronchi noted.  Abdomen: Soft and nontender. Normal bowel sounds. No distention or masses noted. Liver, spleen and kidneys non palpable. Musculoskeletal: Normal range of motion. No signs of joint swelling. No difficulty with gait.  Neurological: Alert and oriented. Cranial nerves II-XII grossly intact. Coordination normal.  Psychiatric: Mood and affect normal. Behavior is normal. Judgment and thought content normal.     BMET    Component Value Date/Time   NA 139 06/24/2021 1507   K 4.2 06/24/2021 1507   CL 106 06/24/2021 1507   CO2 25 06/24/2021 1507   GLUCOSE 104 06/24/2021 1507   BUN 15 06/24/2021 1507   CREATININE 0.95 06/24/2021 1507   CALCIUM 9.4 06/24/2021 1507   GFRNONAA >60 06/06/2019 1934   GFRAA >60 06/06/2019 1934    Lipid Panel  No results found for: CHOL, TRIG, HDL, CHOLHDL, VLDL, LDLCALC  CBC    Component Value Date/Time   WBC 5.5 06/24/2021 1507   RBC 4.62 06/24/2021 1507   HGB 13.0 06/24/2021 1507   HCT 40.5 06/24/2021 1507   PLT 255 06/24/2021 1507   MCV 87.7 06/24/2021 1507   MCH 28.1 06/24/2021 1507   MCHC 32.1 06/24/2021 1507   RDW 12.1 06/24/2021  1507   LYMPHSABS 1.9 10/26/2020 1421   MONOABS 0.4 10/26/2020 1421   EOSABS 0.1 10/26/2020 1421   BASOSABS 0.0 10/26/2020 1421    Hgb A1C No results found for: HGBA1C          Assessment & Plan:   Preventative Health Maintenance:  Encouraged her to get a flu shot in the fall Tetanus UTD Encouraged her to get her COVID-vaccine Pap smear Encouraged her to consume a balanced diet and exercise regimen Advised her to see a dentist annually We will check lipid and hep C today  RTC in 6 months, follow-up chronic conditions Nicki Reaper, NP

## 2021-08-22 ENCOUNTER — Emergency Department: Payer: Medicaid Other

## 2021-08-22 ENCOUNTER — Other Ambulatory Visit: Payer: Self-pay

## 2021-08-22 ENCOUNTER — Ambulatory Visit: Payer: Self-pay | Admitting: *Deleted

## 2021-08-22 ENCOUNTER — Emergency Department
Admission: EM | Admit: 2021-08-22 | Discharge: 2021-08-22 | Disposition: A | Discharge status: Home / Self Care | Payer: Medicaid Other | Attending: Emergency Medicine | Admitting: Emergency Medicine

## 2021-08-22 DIAGNOSIS — R42 Dizziness and giddiness: Secondary | ICD-10-CM

## 2021-08-22 DIAGNOSIS — R479 Unspecified speech disturbances: Secondary | ICD-10-CM

## 2021-08-22 DIAGNOSIS — R519 Headache, unspecified: Secondary | ICD-10-CM | POA: Diagnosis not present

## 2021-08-22 LAB — CBC
HCT: 40.3 % (ref 36.0–46.0)
Hemoglobin: 12.7 g/dL (ref 12.0–15.0)
MCH: 27.5 pg (ref 26.0–34.0)
MCHC: 31.5 g/dL (ref 30.0–36.0)
MCV: 87.4 fL (ref 80.0–100.0)
Platelets: 291 K/uL (ref 150–400)
RBC: 4.61 MIL/uL (ref 3.87–5.11)
RDW: 12.4 % (ref 11.5–15.5)
WBC: 5.3 K/uL (ref 4.0–10.5)
nRBC: 0 % (ref 0.0–0.2)

## 2021-08-22 LAB — COMPREHENSIVE METABOLIC PANEL
ALT: 12 U/L (ref 0–44)
AST: 39 U/L (ref 15–41)
Albumin: 4.6 g/dL (ref 3.5–5.0)
Alkaline Phosphatase: 52 U/L (ref 38–126)
Anion gap: 7 (ref 5–15)
BUN: 16 mg/dL (ref 6–20)
CO2: 26 mmol/L (ref 22–32)
Calcium: 9.4 mg/dL (ref 8.9–10.3)
Chloride: 106 mmol/L (ref 98–111)
Creatinine, Ser: 0.72 mg/dL (ref 0.44–1.00)
GFR, Estimated: >60 mL/min (ref >60)
Glucose, Bld: 103 mg/dL — ABNORMAL HIGH (ref 70–99)
Potassium: 3.9 mmol/L (ref 3.5–5.1)
Sodium: 139 mmol/L (ref 135–145)
Total Bilirubin: 0.7 mg/dL (ref 0.3–1.2)
Total Protein: 8.2 g/dL — ABNORMAL HIGH (ref 6.5–8.1)

## 2021-08-22 LAB — DIFFERENTIAL
Abs Immature Granulocytes: 0.01 10*3/uL (ref 0.00–0.07)
Basophils Absolute: 0 10*3/uL (ref 0.0–0.1)
Basophils Relative: 1 %
Eosinophils Absolute: 0.1 10*3/uL (ref 0.0–0.5)
Eosinophils Relative: 2 %
Immature Granulocytes: 0 %
Lymphocytes Relative: 27 %
Lymphs Abs: 1.4 10*3/uL (ref 0.7–4.0)
Monocytes Absolute: 0.4 10*3/uL (ref 0.1–1.0)
Monocytes Relative: 7 %
Neutro Abs: 3.3 10*3/uL (ref 1.7–7.7)
Neutrophils Relative %: 63 %

## 2021-08-22 LAB — PROTIME-INR
INR: 1 (ref 0.8–1.2)
Prothrombin Time: 13.2 seconds (ref 11.4–15.2)

## 2021-08-22 LAB — TROPONIN I (HIGH SENSITIVITY): Troponin I (High Sensitivity): 3 ng/L (ref <18)

## 2021-08-22 LAB — APTT: aPTT: 28 seconds (ref 24–36)

## 2021-08-22 MED ORDER — METOCLOPRAMIDE HCL 5 MG/ML IJ SOLN
10.0000 mg | Freq: Once | INTRAMUSCULAR | Status: AC
Start: 2021-08-22 — End: 2021-08-22
  Administered 2021-08-22: 10 mg via INTRAMUSCULAR
  Filled 2021-08-22: qty 2

## 2021-08-22 NOTE — ED Triage Notes (Signed)
Pt c/o waking up feeling dizzy and when she called her mother to tell her what was going on she had a hard time finding her words, pt is speaking in complete sentences with no slurred speech at this time. No noted facial droop or drift, no loss of sensation. States she felt fine when she went to bed last night. Pt states she was recently placed on some new medication and is concerned it is due to that.

## 2021-08-22 NOTE — ED Notes (Signed)
Talked with lab, will add on trop

## 2021-08-22 NOTE — Telephone Encounter (Addendum)
  Chief Complaint: Very dizzy, bad headache, unbalanced, lightheaded, poor appetite worse over last 24 hrs.  Almost passed out had to lay down on floor.  Started on Fluboxamine over a month ago.   But was fine until Tues. Symptoms: above Frequency: Since Tues but worse over last 24 hrs Pertinent Negatives: Patient denies visual changes or having to hold onto things to ambulate. Disposition: [x] ED /[] Urgent Care (no appt availability in office) / [] Appointment(In office/virtual)/ []  [place] [facility]/ [] Home Care/ [] Refused Recommended Disposition /[] [place] [facility]/ []  Follow-up with PCP Additional Notes: Mother taking her to [facility] now.   Information sent to [facility]  Called into [facility] and spoke with making her aware of the ED referral.   She will let know.

## 2021-08-22 NOTE — Telephone Encounter (Signed)
Reason for Disposition  Patient sounds very sick or weak to the triager    Referred to ED  Answer Assessment - Initial Assessment Questions 1. SYMPTOM: "What is the main symptom you are concerned about?" (e.g., weakness, numbness)     Started new medication.   But last 24 hrs very dizzy, lightheaded, no appetite.   My balance is off.      I forgot what I was going to say this morning.   I've been on the medicine for a couple of weeks.   I missed 3 days in  a row and then restarted it Sunday night.    Symptoms started yesterday. Medication is Fluboxamine started over a month ago.   This is the one I missed 3 days in a row and restarted  Also Wellbutrin was stopped 3 days in a row and restarted Sunday night.   It's very dangerous to combine these.    2. ONSET: "When did this start?" (minutes, hours, days; while sleeping)     I'm very dizzy and lightheaded this morning.   I felt very unstable going to the bathroom this morning.     I was doing better after starting these but now I feel this way.   3. LAST NORMAL: "When was the last time you (the patient) were normal (no symptoms)?"     Tuesday I started feeling jittery and these symptoms started.   Tues. I felt like I was going to pass out and had to lay down on the floor to get it to pass.   I was down 10 min.    I felt very dizzy when I got up so I went to bed. 4. PATTERN "Does this come and go, or has it been constant since it started?"  "Is it present now?"     Constant since Tues. 5. CARDIAC SYMPTOMS: "Have you had any of the following symptoms: chest pain, difficulty breathing, palpitations?"     No 6. NEUROLOGIC SYMPTOMS: "Have you had any of the following symptoms: headache, dizziness, vision loss, double vision, changes in speech, unsteady on your feet?"     Bad headache last night, I'm in bed now.   I'm very unbalanced this morning.    7. OTHER SYMPTOMS: "Do you have any other symptoms?"     See above 8. PREGNANCY: "Is there any chance  you are pregnant?" "When was your last menstrual period?"     Not asked  Protocols used: Neurologic Deficit-A-AH

## 2021-08-22 NOTE — Discharge Instructions (Signed)
Discontinue taking the fluvoxamine.  You may continue to take the Wellbutrin.  Contact your regular doctor who prescribes these medications in order to determine whether you should restart the fluvoxamine or switch to a different medicine.  Your MRI and lab work-up are normal.  Return to the ER immediately for new, worsening, or persistent severe dizziness or lightheadedness, severe headache, vomiting, difficulty breathing, weakness or numbness, changes in your vision, recurrent changes or problems with speaking, or any other new or worsening symptoms that concern you.

## 2021-08-22 NOTE — ED Notes (Signed)
Pt in bed, pt denies pain, pt to MRI

## 2021-08-22 NOTE — ED Provider Notes (Signed)
[facility] Provider Note    Event Date/Time   First MD Initiated Contact with Patient 08/22/21 1013     (approximate)   History   Dizziness   HPI  Lauren Griffith is a 31 y.o. female with history of depression but no other active medical problems who presents with an episode this morning in which her speech or understanding seemed abnormal when she was on the phone with her mother.  The patient states that she felt like she could not understand or speak clearly for a moment and had difficulty finding words.  The symptoms have now resolved she states that since yesterday she has been feeling somewhat lightheaded, tired or drained, especially when standing up.  She states that about 6 weeks ago she was switched from Prozac to Luvox.  She is also on Wellbutrin but this has not changed.  The patient missed 3 days of her medication last week but then started taking it again 5 days ago and has been taking it consistently since then.  The patient reports a frontal headache.  She denies fever or chills, vomiting or diarrhea, chest or abdominal pain, difficulty breathing, or other acute symptoms.   Physical Exam   Triage Vital Signs: ED Triage Vitals [08/22/21 0933]  Enc Vitals Group     BP 117/86     Pulse Rate 83     Resp 16     Temp 98.3 F (36.8 C)     Temp Source Oral     SpO2 98 %     Weight 130 lb (59 kg)     Height 5\' 3"  (1.6 m)     Head Circumference      Peak Flow      Pain Score 0     Pain Loc      Pain Edu?      Excl. in [place]?     Most recent vital signs: Vitals:   08/22/21 1545 08/22/21 1600  BP:  95/75  Pulse: 81 72  Resp: 16 17  Temp:    SpO2: 97% 97%     General: Alert and oriented, well-appearing. CV:  Good peripheral perfusion.  Resp:  Normal effort.  Abd:  No distention.  Other:  EOMI.  PERRLA.  No facial droop.  Cranial nerves III through XII grossly intact.  5/5 motor strength and intact sensation to all 4 extremities.  No  pronator drift.  No ataxia on finger-to-nose.  Normal speech and understanding, no ataxia.   ED Results / Procedures / Treatments   Labs (all labs ordered are listed, but only abnormal results are displayed) Labs Reviewed  COMPREHENSIVE METABOLIC PANEL - Abnormal; Notable for the following components:      Result Value   Glucose, Bld 103 (*)    Total Protein 8.2 (*)    All other components within normal limits  APTT  CBC  DIFFERENTIAL  PROTIME-INR  POC URINE PREG, ED  TROPONIN I (HIGH SENSITIVITY)  TROPONIN I (HIGH SENSITIVITY)     EKG  ED ECG REPORT I, Arta Silence, the attending physician, personally viewed and interpreted this ECG.  Date: 08/22/2021 EKG Time: 09 37 Rate: 91 Rhythm: normal sinus rhythm QRS Axis: normal Intervals: normal ST/T Wave abnormalities: Nonspecific T wave flattening Narrative Interpretation: Nonspecific abnormalities with no evidence of acute ischemia   RADIOLOGY  MR brain: I independently viewed and interpreted the images; there is no ICH, mass effect, or other acute abnormality.  Radiology report  confirms no acute findings.  PROCEDURES:  Critical Care performed: No  Procedures   MEDICATIONS ORDERED IN ED: Medications  metoCLOPramide (REGLAN) injection 10 mg (10 mg Intramuscular Given 08/22/21 1505)     IMPRESSION / MDM / ASSESSMENT AND PLAN / ED COURSE  I reviewed the triage vital signs and the nursing notes.  31 year old female with PMH as noted above presents with generalized lightheadedness and weakness since yesterday along with a brief episode of possible aphasia this morning that is now resolved.  On exam the patient is well-appearing.  Her vital signs are normal.  The physical exam is unremarkable.  Thorough neurologic exam is normal.  The patient has had a recent change in her antidepressants and also missed a few doses last week, then restarted the medication.  Differential diagnosis includes, but is not limited  to, medication side effect, dehydration, electrolyte abnormality, other metabolic cause, complex migraine, near syncope.  Given the patient's age and lack of risk factors I have an extremely low suspicion for acute stroke.  There is no evidence of ICH.  The patient was concerned about possible serotonin syndrome, however her temperature is normal she is alert and oriented, and has been on stable doses of her medications for several weeks.  Patient's presentation is most consistent with acute complicated illness / injury requiring diagnostic workup.  Basic labs have been obtained and are unremarkable.  Electrolytes are normal.  There is no leukocytosis.  The patient was initially ordered for a CT, however given that her NIH stroke scale 0 and her symptoms are resolved, she does not require emergent imaging and consequent radiation exposure.  I consulted Dr. Quinn Axe from neurology.  She agrees with this, and recommends an MRI of the brain without contrast instead of CT.  If this is negative, she does not recommend further neurologic work-up or intervention.  The patient is on the cardiac monitor to evaluate for evidence of arrhythmia and/or significant heart rate changes.  ----------------------------------------- 4:39 PM on 08/22/2021 -----------------------------------------  MRI is no acute abnormality.  Given the patient's nonspecific T wave abnormalities on the EKG I also ordered a troponin which is negative.  The patient is feeling somewhat better although still has a headache.  I ordered a dose of Reglan.  She has not had any recurrence of the speech disturbance or any other acute neurological symptoms.  There is no evidence of acute neurologic etiology.  As above, there is no evidence of serotonin syndrome, NMS, or other life-threatening acute complication of her medications  At this time, she is stable for discharge home.  I counseled her on the results of the work-up.  I recommend she follow-up  with her primary care doctor.  I advised her to discontinue the Luvox for now, continue Wellbutrin since she has been on it for a long time, and discussed the further plan for these medications with her doctor.  I gave her very thorough return precautions and she expresses understanding.    FINAL CLINICAL IMPRESSION(S) / ED DIAGNOSES   Final diagnoses:  Lightheadedness  Nonintractable headache, unspecified chronicity pattern, unspecified headache type  Speech disturbance, unspecified type     Rx / DC Orders   ED Discharge Orders     None        Note:  This document was prepared using Dragon voice recognition software and may include unintentional dictation errors.    Arta Silence, MD 08/22/21 [phone]

## 2021-09-07 ENCOUNTER — Encounter: Payer: Self-pay | Admitting: Oncology

## 2021-09-09 ENCOUNTER — Other Ambulatory Visit: Payer: Self-pay | Admitting: Internal Medicine

## 2021-09-20 ENCOUNTER — Other Ambulatory Visit: Payer: Self-pay | Admitting: Internal Medicine

## 2021-09-20 DIAGNOSIS — F32A Depression, unspecified: Secondary | ICD-10-CM

## 2021-09-20 NOTE — Telephone Encounter (Signed)
Requested Prescriptions  Pending Prescriptions Disp Refills  . rOPINIRole (REQUIP) 0.25 MG tablet [Pharmacy Med Name: ROPINIROLE HCL 0.25 MG TABLET] 90 tablet     Sig: TAKE 1 TABLET (0.25 MG TOTAL) BY MOUTH AT BEDTIME AS NEEDED.     Neurology:  Parkinsonian Agents Passed - 09/20/2021  1:53 AM      Passed - Last BP in normal range    BP Readings from Last 1 Encounters:  08/22/21 95/75         Passed - Last Heart Rate in normal range    Pulse Readings from Last 1 Encounters:  08/22/21 72         Passed - Valid encounter within last 12 months    Recent Outpatient Visits          2 months ago Restless legs syndrome (RLS)   [facility] [place], Salvadore Oxford, NP   1 year ago Anxiety and depression   [facility] [place], [place] W, NP             . buPROPion (WELLBUTRIN XL) 300 MG 24 hr tablet [Pharmacy Med Name: BUPROPION HCL XL 300 MG TABLET] 90 tablet 0    Sig: TAKE 1 TABLET BY MOUTH EVERY DAY     Psychiatry: Antidepressants - bupropion Passed - 09/20/2021  1:53 AM      Passed - Cr in normal range and within 360 days    Creat  Date Value Ref Range Status  06/24/2021 0.95 0.50 - 0.97 mg/dL Final   Creatinine, Ser  Date Value Ref Range Status  08/22/2021 0.72 0.44 - 1.00 mg/dL Final         Passed - AST in normal range and within 360 days    AST  Date Value Ref Range Status  08/22/2021 39 15 - 41 U/L Final         Passed - ALT in normal range and within 360 days    ALT  Date Value Ref Range Status  08/22/2021 12 0 - 44 U/L Final         Passed - Completed PHQ-2 or PHQ-9 in the last 360 days      Passed - Last BP in normal range    BP Readings from Last 1 Encounters:  08/22/21 95/75         Passed - Valid encounter within last 6 months    Recent Outpatient Visits          2 months ago Restless legs syndrome (RLS)   [facility] Lorre Munroe, NP   1 year ago Anxiety and depression   [facility],  Salvadore Oxford, NP

## 2021-11-07 ENCOUNTER — Ambulatory Visit
Admission: EM | Admit: 2021-11-07 | Discharge: 2021-11-07 | Disposition: A | Discharge status: Home / Self Care | Payer: Medicaid Other | Attending: Emergency Medicine | Admitting: Emergency Medicine

## 2021-11-07 DIAGNOSIS — J029 Acute pharyngitis, unspecified: Secondary | ICD-10-CM

## 2021-11-07 MED ORDER — LIDOCAINE VISCOUS HCL 2 % MT SOLN: 15.0000 mL | OROMUCOSAL | 0 refills | Status: DC | PRN

## 2021-11-07 MED ORDER — AMOXICILLIN-POT CLAVULANATE 875-125 MG PO TABS: 1.0000 | ORAL_TABLET | Freq: Two times a day (BID) | ORAL | 0 refills | Status: AC

## 2021-11-07 MED ORDER — FLUCONAZOLE 150 MG PO TABS: 150.0000 mg | ORAL_TABLET | Freq: Every day | ORAL | 0 refills | Status: AC

## 2021-11-07 NOTE — ED Provider Notes (Signed)
Lauren Griffith    CSN: [id number] Arrival date & time: 11/07/21  1801      History   Chief Complaint Chief Complaint  Patient presents with   Sore Throat    HPI Lauren Griffith is a 31 y.o. female.   Patient presents with a sore throat that is persisting and worsening over the last 6 days.  Painful swallowing difficulty tolerating food but able to tolerate fluids.  Known exposure to strep.  Completed an E-visit 4 days ago and started on amoxicillin which has been ineffective.  Additionally has been taking Tylenol and ibuprofen consistently which has been helpful in managing pain.  Denies fevers, chills, body aches, ear pain, coughing, headache, shortness of breath, wheezing.  Past Medical History:  Diagnosis Date   Anemia    Anxiety    takes prozac   Asthma    as a child   Depression    Iron deficiency anemia due to chronic blood loss 07/23/2020   Urinary tract infection     Patient Active Problem List   Diagnosis Date Noted   Chronic fatigue 06/24/2021   Restless legs syndrome 06/24/2021   Iron deficiency anemia due to chronic blood loss 07/23/2020   Anxiety and depression 01/16/2014    Past Surgical History:  Procedure Laterality Date   APPENDECTOMY     DILATION AND CURETTAGE OF UTERUS     LAPAROSCOPIC APPENDECTOMY N/A 03/22/2018   Procedure: APPENDECTOMY LAPAROSCOPIC;  Surgeon: Darnell Level, MD;  Location: [facility];  Service: General;  Laterality: N/A;    OB History     Gravida  6   Para  5   Term  5   Preterm      AB  1   Living  5      SAB      IAB  1   Ectopic      Multiple  0   Live Births  5            Home Medications    Prior to Admission medications   Medication Sig Start Date End Date Taking? Authorizing Provider  amoxicillin-clavulanate (AUGMENTIN) 875-125 MG tablet Take 1 tablet by mouth every 12 (twelve) hours for 10 days. 11/07/21 11/17/21 Yes Makaila Windle R, NP  fluconazole (DIFLUCAN) 150 MG tablet Take 1 tablet  (150 mg total) by mouth daily for 2 doses. 11/07/21 11/09/21 Yes Dianely Krehbiel R, NP  lidocaine (XYLOCAINE) 2 % solution Use as directed 15 mLs in the mouth or throat every 4 (four) hours as needed for mouth pain. 11/07/21  Yes Erion Weightman, Hansel Starling R, NP  buPROPion (WELLBUTRIN XL) 300 MG 24 hr tablet TAKE 1 TABLET BY MOUTH EVERY DAY 09/20/21   Lorre Munroe, NP  Ferrous Sulfate (IRON) 325 (65 Fe) MG TABS Take 1 tablet by mouth daily.    [provider]  fluvoxaMINE (LUVOX) 25 MG tablet TAKE 1 TABLET BY MOUTH EVERYDAY AT BEDTIME 07/18/21   Baity, Salvadore Oxford, NP  Misc Natural Products (T-RELIEF CBD+13 SL) Place under the tongue.    [provider]  Multiple Vitamin (MULTIVITAMIN) tablet Take 1 tablet by mouth daily.    [provider]  rOPINIRole (REQUIP) 0.25 MG tablet TAKE 1 TABLET (0.25 MG TOTAL) BY MOUTH AT BEDTIME AS NEEDED. 09/20/21   Lorre Munroe, NP  Vitamin D, Ergocalciferol, (DRISDOL) 1.25 MG (50000 UNIT) CAPS capsule TAKE 1 CAPSULE BY MOUTH ONCE A WEEK. FOR 12 WEEKS. THEN START OTC VITAMIN D3 2,000  UNIT DAILY. 09/10/21   Lorre Munroe, NP    Family History Family History  Problem Relation Age of Onset   Diabetes Maternal Grandmother    Diabetes Maternal Grandfather    Diabetes Paternal Grandmother    Diabetes Paternal Grandfather     Social History Social History   Tobacco Use   Smoking status: Never   Smokeless tobacco: Never  Vaping Use   Vaping Use: Never used  Substance Use Topics   Alcohol use: No   Drug use: No     Allergies   Cinnamon   Review of Systems Review of Systems Defer to HPI   Physical Exam Triage Vital Signs ED Triage Vitals  Enc Vitals Group     BP 11/07/21 1810 115/80     Pulse Rate 11/07/21 1810 83     Resp 11/07/21 1810 20     Temp 11/07/21 1810 99.1 F (37.3 C)     Temp src --      SpO2 11/07/21 1810 98 %     Weight --      Height --      Head Circumference --      Peak Flow --      Pain Score 11/07/21 1808 9      Pain Loc --      Pain Edu? --      Excl. in GC? --    No data found.  Updated Vital Signs BP 115/80   Pulse 83   Temp 99.1 F (37.3 C)   Resp 20   LMP 10/10/2021   SpO2 98%   Visual Acuity Right Eye Distance:   Left Eye Distance:   Bilateral Distance:    Right Eye Near:   Left Eye Near:    Bilateral Near:     Physical Exam Constitutional:      Appearance: She is well-developed.  HENT:     Right Ear: Tympanic membrane and ear canal normal.     Left Ear: Tympanic membrane and ear canal normal.     Nose: No congestion or rhinorrhea.     Mouth/Throat:     Mouth: Mucous membranes are moist.     Pharynx: Posterior oropharyngeal erythema present.     Tonsils: No tonsillar exudate. 1+ on the right. 1+ on the left.  Cardiovascular:     Rate and Rhythm: Normal rate and regular rhythm.     Heart sounds: Normal heart sounds.  Pulmonary:     Effort: Pulmonary effort is normal.     Breath sounds: Normal breath sounds.  Musculoskeletal:     Cervical back: Normal range of motion.  Lymphadenopathy:     Cervical: Cervical adenopathy present.  Skin:    General: Skin is warm and dry.  Neurological:     General: No focal deficit present.     Mental Status: She is alert and oriented to person, place, and time.      UC Treatments / Results  Labs (all labs ordered are listed, but only abnormal results are displayed) Labs Reviewed - No data to display  EKG   Radiology No results found.  Procedures Procedures (including critical care time)  Medications Ordered in UC Medications - No data to display  Initial Impression / Assessment and Plan / UC Course  I have reviewed the triage vital signs and the nursing notes.  Pertinent labs & imaging results that were available during my care of the patient were reviewed by me and considered in my  medical decision making (see chart for details).  Pharyngitis  Vital signs are stable, patient is in no signs of distress nor  toxic ill-appearing, tonsils are mildly enlarged with no exudate or erythema noted to the oropharynx, discussed with patient, etiology of symptoms are most likely viral however as she has been taking amoxicillin unable to test for strep as it will skew the results, as she endorses that symptoms are worsening we will continue to provide bacterial coverage, monitor cefazolin discontinued and prescribed Augmentin for 10 days as well as viscous lidocaine and Diflucan prophylactically, may continue use of over-the-counter analgesics and given precautions to follow-up if symptoms continue to worsen Final Clinical Impressions(s) / UC Diagnoses   Final diagnoses:  Pharyngitis, unspecified etiology     Discharge Instructions      Your symptoms today are most likely caused by a virus which is why the antibiotic has been ineffective however as you have been taking amoxicillin we are unable to test you for strep as it may skew the results since symptoms are worsening we will continue to provide you bacterial coverage  Stop use of amoxicillin and begin use of Augmentin which covers for additional bacteria, take every morning and every evening for 10 days  You may gargle and spit lidocaine solution as needed every 4 hours to give a temporary numbing effect through your throat  Continue consistent use of Tylenol and ibuprofen for management of pain  May attempt salt water gargles, Listerine gargles, throat lozenges, warm to cool liquids to preference, over-the-counter Chloraseptic spray  If your symptoms continue to persist or worsen please follow-up   ED Prescriptions     Medication Sig Dispense Auth. Provider   amoxicillin-clavulanate (AUGMENTIN) 875-125 MG tablet Take 1 tablet by mouth every 12 (twelve) hours for 10 days. 20 tablet Lizbet Cirrincione R, NP   lidocaine (XYLOCAINE) 2 % solution Use as directed 15 mLs in the mouth or throat every 4 (four) hours as needed for mouth pain. 100 mL Antwaun Buth,  Sandro Burgo R, NP   fluconazole (DIFLUCAN) 150 MG tablet Take 1 tablet (150 mg total) by mouth daily for 2 doses. 2 tablet Valinda Hoar, NP      PDMP not reviewed this encounter.   Valinda Hoar, [place] 11/07/21 [phone]

## 2021-11-07 NOTE — ED Triage Notes (Signed)
Pt presents with c/o sore throat that began on Saturday , did tele-visit on Monday and began amoxicillin but pain has became worse

## 2021-11-07 NOTE — Discharge Instructions (Signed)
Your symptoms today are most likely caused by a virus which is why the antibiotic has been ineffective however as you have been taking amoxicillin we are unable to test you for strep as it may skew the results since symptoms are worsening we will continue to provide you bacterial coverage  Stop use of amoxicillin and begin use of Augmentin which covers for additional bacteria, take every morning and every evening for 10 days  You may gargle and spit lidocaine solution as needed every 4 hours to give a temporary numbing effect through your throat  Continue consistent use of Tylenol and ibuprofen for management of pain  May attempt salt water gargles, Listerine gargles, throat lozenges, warm to cool liquids to preference, over-the-counter Chloraseptic spray  If your symptoms continue to persist or worsen please follow-up

## 2021-12-17 ENCOUNTER — Other Ambulatory Visit: Payer: Self-pay | Admitting: Internal Medicine

## 2021-12-17 NOTE — Telephone Encounter (Signed)
Requested Prescriptions  Pending Prescriptions Disp Refills  . rOPINIRole (REQUIP) 0.25 MG tablet [Pharmacy Med Name: ROPINIROLE HCL 0.25 MG TABLET] 90 tablet 1    Sig: TAKE 1 TABLET (0.25 MG TOTAL) BY MOUTH AT BEDTIME AS NEEDED.     Neurology:  Parkinsonian Agents Passed - 12/17/2021  1:30 PM      Passed - Last BP in normal range    BP Readings from Last 1 Encounters:  11/07/21 115/80         Passed - Last Heart Rate in normal range    Pulse Readings from Last 1 Encounters:  11/07/21 83         Passed - Valid encounter within last 12 months    Recent Outpatient Visits          5 months ago Restless legs syndrome (RLS)   [facility] [place], Coralie Keens, NP   1 year ago Anxiety and depression   [facility] [place], Coralie Keens, NP

## 2022-01-25 ENCOUNTER — Other Ambulatory Visit: Payer: Self-pay | Admitting: Internal Medicine

## 2022-01-25 DIAGNOSIS — F419 Anxiety disorder, unspecified: Secondary | ICD-10-CM

## 2022-01-27 NOTE — Telephone Encounter (Signed)
Patient called, left VM to return the call to the office to scheduled an appt for medication refill request.   

## 2022-01-27 NOTE — Telephone Encounter (Signed)
Requested Prescriptions  Pending Prescriptions Disp Refills   fluvoxaMINE (LUVOX) 25 MG tablet [Pharmacy Med Name: FLUVOXAMINE MALEATE 25 MG TAB] 30 tablet 0    Sig: TAKE 1 TABLET BY MOUTH EVERYDAY AT BEDTIME     Psychiatry:  Antidepressants - SSRI Failed - 01/25/2022 12:20 PM      Failed - Valid encounter within last 6 months    Recent Outpatient Visits           7 months ago Restless legs syndrome (RLS)   [facility], Salvadore Oxford, NP   1 year ago Anxiety and depression   [facility] [place], Salvadore Oxford, NP              Passed - Completed PHQ-2 or PHQ-9 in the last 360 days       buPROPion (WELLBUTRIN XL) 300 MG 24 hr tablet [Pharmacy Med Name: BUPROPION HCL XL 300 MG TABLET] 30 tablet 0    Sig: TAKE 1 TABLET BY MOUTH EVERY DAY     Psychiatry: Antidepressants - bupropion Failed - 01/25/2022 12:20 PM      Failed - Valid encounter within last 6 months    Recent Outpatient Visits           7 months ago Restless legs syndrome (RLS)   [facility] [place], Salvadore Oxford, NP   1 year ago Anxiety and depression   [facility] [place], [place] W, NP              Passed - Cr in normal range and within 360 days    Creat  Date Value Ref Range Status  06/24/2021 0.95 0.50 - 0.97 mg/dL Final   Creatinine, Ser  Date Value Ref Range Status  08/22/2021 0.72 0.44 - 1.00 mg/dL Final         Passed - AST in normal range and within 360 days    AST  Date Value Ref Range Status  08/22/2021 39 15 - 41 U/L Final         Passed - ALT in normal range and within 360 days    ALT  Date Value Ref Range Status  08/22/2021 12 0 - 44 U/L Final         Passed - Completed PHQ-2 or PHQ-9 in the last 360 days      Passed - Last BP in normal range    BP Readings from Last 1 Encounters:  11/07/21 115/80

## 2022-01-31 ENCOUNTER — Ambulatory Visit
Admission: EM | Admit: 2022-01-31 | Discharge: 2022-01-31 | Disposition: A | Discharge status: Home / Self Care | Payer: Medicaid Other

## 2022-01-31 DIAGNOSIS — Z77098 Contact with and (suspected) exposure to other hazardous, chiefly nonmedicinal, chemicals: Secondary | ICD-10-CM | POA: Diagnosis not present

## 2022-01-31 DIAGNOSIS — R0602 Shortness of breath: Secondary | ICD-10-CM | POA: Diagnosis not present

## 2022-01-31 NOTE — ED Triage Notes (Addendum)
Patient to Urgent Care with complaints of wheezing and burning- reports she breathed in some chemicals when cleaning yesterday and feels short of breath today. Reports she had used draino then cleaned with clorox.  Reports that she feels like she can't take a deep breath now- had to sit outside last night.   Hx of asthma as a child.

## 2022-01-31 NOTE — ED Notes (Signed)
Patient is being discharged from the Urgent Care and sent to the Emergency Department via POV . Per Wendee Beavers, patient is in need of higher level of care due to chemical exposure. Patient is aware and verbalizes understanding of plan of care.  Vitals:   01/31/22 1053 01/31/22 1102  BP:  118/64  Pulse: 87   Resp: 18   Temp: 97.9 F (36.6 C)   SpO2: 98%

## 2022-01-31 NOTE — Discharge Instructions (Signed)
Go to the emergency department for evaluation of your shortness of breath after chemical exposure.

## 2022-01-31 NOTE — ED Provider Notes (Signed)
Lauren Griffith    CSN: [id number] Arrival date & time: 01/31/22  1008      History   Chief Complaint Chief Complaint  Patient presents with   Wheezing    Breathed in some cleaning chemicals yesterday and am very short of breath today - Entered by patient    HPI Lauren Griffith is a 31 y.o. female.  Patient presents with shortness of breath since yesterday afternoon after she cleaned with clorox and had used drano the day before.  The fumes caused her to feel short of breath so she stepped outside for fresh air.  She has been worse since last night and feels like she cannot take a deep breath.  No fever, chills, cough, chest pain, or other symptoms.  Her medical history includes asthma as a child.    The history is provided by the patient and medical records.    Past Medical History:  Diagnosis Date   Anemia    Anxiety    takes prozac   Asthma    as a child   Depression    Iron deficiency anemia due to chronic blood loss 07/23/2020   Urinary tract infection     Patient Active Problem List   Diagnosis Date Noted   Chronic fatigue 06/24/2021   Restless legs syndrome 06/24/2021   Iron deficiency anemia due to chronic blood loss 07/23/2020   Anxiety and depression 01/16/2014    Past Surgical History:  Procedure Laterality Date   APPENDECTOMY     DILATION AND CURETTAGE OF UTERUS     LAPAROSCOPIC APPENDECTOMY N/A 03/22/2018   Procedure: APPENDECTOMY LAPAROSCOPIC;  Surgeon: Darnell Level, MD;  Location: [facility];  Service: General;  Laterality: N/A;    OB History     Gravida  6   Para  5   Term  5   Preterm      AB  1   Living  5      SAB      IAB  1   Ectopic      Multiple  0   Live Births  5            Home Medications    Prior to Admission medications   Medication Sig Start Date End Date Taking? Authorizing Provider  buPROPion (WELLBUTRIN XL) 300 MG 24 hr tablet TAKE 1 TABLET BY MOUTH EVERY DAY 01/27/22   Lorre Munroe, NP   Ferrous Sulfate (IRON) 325 (65 Fe) MG TABS Take 1 tablet by mouth daily.    [provider]  fluvoxaMINE (LUVOX) 25 MG tablet TAKE 1 TABLET BY MOUTH EVERYDAY AT BEDTIME 01/27/22   Baity, Salvadore Oxford, NP  lidocaine (XYLOCAINE) 2 % solution Use as directed 15 mLs in the mouth or throat every 4 (four) hours as needed for mouth pain. 11/07/21   White, Elita Boone, NP  Misc Natural Products (T-RELIEF CBD+13 SL) Place under the tongue.    [provider]  Multiple Vitamin (MULTIVITAMIN) tablet Take 1 tablet by mouth daily.    [provider]  rOPINIRole (REQUIP) 0.25 MG tablet TAKE 1 TABLET (0.25 MG TOTAL) BY MOUTH AT BEDTIME AS NEEDED. 12/17/21   Lorre Munroe, NP  Vitamin D, Ergocalciferol, (DRISDOL) 1.25 MG (50000 UNIT) CAPS capsule TAKE 1 CAPSULE BY MOUTH ONCE A WEEK. FOR 12 WEEKS. THEN START OTC VITAMIN D3 2,000 UNIT DAILY. 09/10/21   Lorre Munroe, NP    Family History Family History  Problem Relation Age  of Onset   Diabetes Maternal Grandmother    Diabetes Maternal Grandfather    Diabetes Paternal Grandmother    Diabetes Paternal Grandfather     Social History Social History   Tobacco Use   Smoking status: Never   Smokeless tobacco: Never  Vaping Use   Vaping Use: Never used  Substance Use Topics   Alcohol use: No   Drug use: No     Allergies   Cinnamon   Review of Systems Review of Systems  Constitutional:  Negative for chills and fever.  HENT:  Negative for sore throat, trouble swallowing and voice change.   Respiratory:  Positive for shortness of breath. Negative for cough.   Cardiovascular:  Negative for chest pain and palpitations.  Gastrointestinal:  Negative for nausea and vomiting.  Skin:  Negative for color change and rash.  All other systems reviewed and are negative.    Physical Exam Triage Vital Signs ED Triage Vitals  Enc Vitals Group     BP 01/31/22 1102 118/64     Pulse Rate 01/31/22 1053 87     Resp 01/31/22 1053 18      Temp 01/31/22 1053 97.9 F (36.6 C)     Temp src --      SpO2 01/31/22 1053 98 %     Weight 01/31/22 1100 125 lb (56.7 kg)     Height 01/31/22 1100 5\' 3"  (1.6 m)     Head Circumference --      Peak Flow --      Pain Score 01/31/22 1056 0     Pain Loc --      Pain Edu? --      Excl. in GC? --    No data found.  Updated Vital Signs BP 118/64   Pulse 87   Temp 97.9 F (36.6 C)   Resp 18   Ht 5\' 3"  (1.6 m)   Wt 125 lb (56.7 kg)   LMP 01/06/2022   SpO2 98%   BMI 22.14 kg/m   Visual Acuity Right Eye Distance:   Left Eye Distance:   Bilateral Distance:    Right Eye Near:   Left Eye Near:    Bilateral Near:     Physical Exam Vitals and nursing note reviewed.  Constitutional:      General: She is not in acute distress.    Appearance: Normal appearance. She is well-developed. She is not ill-appearing.  HENT:     Right Ear: Tympanic membrane normal.     Left Ear: Tympanic membrane normal.     Nose: Nose normal.     Mouth/Throat:     Mouth: Mucous membranes are moist.     Pharynx: Oropharynx is clear.  Cardiovascular:     Rate and Rhythm: Normal rate and regular rhythm.     Heart sounds: Normal heart sounds.  Pulmonary:     Effort: Pulmonary effort is normal. No respiratory distress.     Breath sounds: Normal breath sounds.  Musculoskeletal:     Cervical back: Neck supple.  Skin:    General: Skin is warm and dry.  Neurological:     Mental Status: She is alert.  Psychiatric:        Mood and Affect: Mood normal.        Behavior: Behavior normal.      UC Treatments / Results  Labs (all labs ordered are listed, but only abnormal results are displayed) Labs Reviewed - No data to display  EKG  Radiology No results found.  Procedures Procedures (including critical care time)  Medications Ordered in UC Medications - No data to display  Initial Impression / Assessment and Plan / UC Course  I have reviewed the triage vital signs and the nursing  notes.  Pertinent labs & imaging results that were available during my care of the patient were reviewed by me and considered in my medical decision making (see chart for details).    Shortness of breath, Chemical exposure.  No acute respiratory distress, O2 sat 98% on room air.  Patient has felt short of breath since she was exposed to Clorox and Drano yesterday.  Her symptoms became acutely worse overnight.  She feels like she cannot take a full breath.  Sending her to the ED for evaluation.  She is agreeable to this and feels stable for transport POV.  Final Clinical Impressions(s) / UC Diagnoses   Final diagnoses:  Shortness of breath  Chemical exposure     Discharge Instructions      Go to the emergency department for evaluation of your shortness of breath after chemical exposure.     ED Prescriptions   None    PDMP not reviewed this encounter.   Mickie Bail, NP 01/31/22 1128

## 2022-02-12 ENCOUNTER — Ambulatory Visit (INDEPENDENT_AMBULATORY_CARE_PROVIDER_SITE_OTHER): Payer: Medicaid Other | Admitting: Internal Medicine

## 2022-02-12 ENCOUNTER — Encounter: Payer: Self-pay | Admitting: Internal Medicine

## 2022-02-12 VITALS — BP 122/74 | HR 81 | Temp 96.9°F | Wt 120.0 lb

## 2022-02-12 DIAGNOSIS — F419 Anxiety disorder, unspecified: Secondary | ICD-10-CM

## 2022-02-12 DIAGNOSIS — F429 Obsessive-compulsive disorder, unspecified: Secondary | ICD-10-CM | POA: Insufficient documentation

## 2022-02-12 DIAGNOSIS — G2581 Restless legs syndrome: Secondary | ICD-10-CM

## 2022-02-12 DIAGNOSIS — F428 Other obsessive-compulsive disorder: Secondary | ICD-10-CM

## 2022-02-12 DIAGNOSIS — D500 Iron deficiency anemia secondary to blood loss (chronic): Secondary | ICD-10-CM | POA: Diagnosis not present

## 2022-02-12 DIAGNOSIS — F32A Depression, unspecified: Secondary | ICD-10-CM

## 2022-02-12 MED ORDER — HYDROXYZINE HCL 50 MG PO TABS: 50.0000 mg | ORAL_TABLET | Freq: Every day | ORAL | 1 refills | Status: DC | PRN

## 2022-02-12 MED ORDER — FLUVOXAMINE MALEATE 25 MG PO TABS
25.0000 mg | ORAL_TABLET | Freq: Two times a day (BID) | ORAL | 1 refills | Status: DC
Start: 2022-02-12 — End: 2022-08-01

## 2022-02-12 NOTE — Progress Notes (Signed)
Subjective:    Patient ID: Lauren Griffith, female    DOB: 10-02-1990, 31 y.o.   MRN: [id number]  HPI  Patient presents to clinic today for follow-up of chronic conditions.  Anxiety and Depression: Chronic, managed with Fluvoxamine and Bupropion. She increased her Fluvoxamine to 25 mg BID. She thinks she may have OCD. She has obsessive thoughts. She also thinks she may been having panic attacks.  She is not currently seeing a therapist.  She denies SI/HI.  RLS: She discontinued Ropinirole due to side effects.  She does not follow with neurology.  Iron Deficiency Anemia: Her last H/H was 12.7/40.3, 08/2021.  She is taking oral Iron as prescribed.  She follows with hematology.  Review of Systems     Past Medical History:  Diagnosis Date   Anemia    Anxiety    takes prozac   Asthma    as a child   Depression    Iron deficiency anemia due to chronic blood loss 07/23/2020   Urinary tract infection     Current Outpatient Medications  Medication Sig Dispense Refill   buPROPion (WELLBUTRIN XL) 300 MG 24 hr tablet TAKE 1 TABLET BY MOUTH EVERY DAY 30 tablet 0   Ferrous Sulfate (IRON) 325 (65 Fe) MG TABS Take 1 tablet by mouth daily.     fluvoxaMINE (LUVOX) 25 MG tablet TAKE 1 TABLET BY MOUTH EVERYDAY AT BEDTIME 30 tablet 0   lidocaine (XYLOCAINE) 2 % solution Use as directed 15 mLs in the mouth or throat every 4 (four) hours as needed for mouth pain. 100 mL 0   Misc Natural Products (T-RELIEF CBD+13 SL) Place under the tongue.     Multiple Vitamin (MULTIVITAMIN) tablet Take 1 tablet by mouth daily.     rOPINIRole (REQUIP) 0.25 MG tablet TAKE 1 TABLET (0.25 MG TOTAL) BY MOUTH AT BEDTIME AS NEEDED. 90 tablet 1   Vitamin D, Ergocalciferol, (DRISDOL) 1.25 MG (50000 UNIT) CAPS capsule TAKE 1 CAPSULE BY MOUTH ONCE A WEEK. FOR 12 WEEKS. THEN START OTC VITAMIN D3 2,000 UNIT DAILY. 12 capsule 0   No current facility-administered medications for this visit.    Allergies  Allergen Reactions    Cinnamon Swelling and Other (See Comments)    Mouth swelling. Pt states it is a minor allergy     Family History  Problem Relation Age of Onset   Diabetes Maternal Grandmother    Diabetes Maternal Grandfather    Diabetes Paternal Grandmother    Diabetes Paternal Grandfather     Social History   Socioeconomic History   Marital status: Single    Spouse name: Not on file   Number of children: Not on file   Years of education: Not on file   Highest education level: Not on file  Occupational History   Not on file  Tobacco Use   Smoking status: Never   Smokeless tobacco: Never  Vaping Use   Vaping Use: Never used  Substance and Sexual Activity   Alcohol use: No   Drug use: No   Sexual activity: Yes    Birth control/protection: None  Other Topics Concern   Not on file  Social History Narrative   Not on file   Social Determinants of Health   Financial Resource Strain: Low Risk  (08/18/2018)   Overall Financial Resource Strain (CARDIA)    Difficulty of Paying Living Expenses: Not hard at all  Food Insecurity: No Food Insecurity (08/18/2018)   Hunger Vital Sign  Worried About Programme researcher, broadcasting/film/video in the Last Year: Never true    Ran Out of Food in the Last Year: Never true  Transportation Needs: Unknown (08/18/2018)   PRAPARE - Administrator, Civil Service (Medical): No    Lack of Transportation (Non-Medical): Not on file  Physical Activity: Not on file  Stress: Stress Concern Present (08/18/2018)   [employer] of Occupational Health - Occupational Stress Questionnaire    Feeling of Stress : To some extent  Social Connections: Not on file  Intimate Partner Violence: Not At Risk (08/18/2018)   Humiliation, Afraid, Rape, and Kick questionnaire    Fear of Current or Ex-Partner: No    Emotionally Abused: No    Physically Abused: No    Sexually Abused: No     Constitutional: Denies fever, malaise, fatigue, headache or abrupt weight changes.  Respiratory:  Denies difficulty breathing, shortness of breath, cough or sputum production.   Cardiovascular: Denies chest pain, chest tightness, palpitations or swelling in the hands or feet.  Musculoskeletal: Denies decrease in range of motion, difficulty with gait, muscle pain or joint pain and swelling.  Skin: Denies redness, rashes, lesions or ulcercations.  Neurological: Patient reports restless legs.  Denies dizziness, difficulty with memory, difficulty with speech or problems with balance and coordination.  Psych: Patient has a history of anxiety and depression.  Denies SI/HI.  No other specific complaints in a complete review of systems (except as listed in HPI above).  Objective:   Physical Exam   BP 122/74 (BP Location: Left Arm, Patient Position: Sitting, Cuff Size: Normal)   Pulse 81   Temp (!) 96.9 F (36.1 C) (Temporal)   Wt 120 lb (54.4 kg)   LMP 01/06/2022   SpO2 99%   BMI 21.26 kg/m   Wt Readings from Last 3 Encounters:  01/31/22 125 lb (56.7 kg)  08/22/21 130 lb (59 kg)  06/24/21 127 lb (57.6 kg)    General: Appears her stated age, well developed, well nourished in NAD. Skin: Warm, dry and intact. . Cardiovascular: Normal rate and rhythm. S1,S2 noted.  No murmur, rubs or gallops noted.  Pulmonary/Chest: Normal effort and positive vesicular breath sounds. No respiratory distress. No wheezes, rales or ronchi noted.  Musculoskeletal: No difficulty with gait.  Neurological: Alert and oriented.  Psychiatric: Mood and affect normal. Behavior is normal. Judgment and thought content normal.    BMET    Component Value Date/Time   NA 139 08/22/2021 0934   K 3.9 08/22/2021 0934   CL 106 08/22/2021 0934   CO2 26 08/22/2021 0934   GLUCOSE 103 (H) 08/22/2021 0934   BUN 16 08/22/2021 0934   CREATININE 0.72 08/22/2021 0934   CREATININE 0.95 06/24/2021 1507   CALCIUM 9.4 08/22/2021 0934   GFRNONAA >60 08/22/2021 0934   GFRAA >60 06/06/2019 1934    Lipid Panel  No results  found for: "CHOL", "TRIG", "HDL", "CHOLHDL", "VLDL", "LDLCALC"  CBC    Component Value Date/Time   WBC 5.3 08/22/2021 0934   RBC 4.61 08/22/2021 0934   HGB 12.7 08/22/2021 0934   HCT 40.3 08/22/2021 0934   PLT 291 08/22/2021 0934   MCV 87.4 08/22/2021 0934   MCH 27.5 08/22/2021 0934   MCHC 31.5 08/22/2021 0934   RDW 12.4 08/22/2021 0934   LYMPHSABS 1.4 08/22/2021 0934   MONOABS 0.4 08/22/2021 0934   EOSABS 0.1 08/22/2021 0934   BASOSABS 0.0 08/22/2021 0934    Hgb A1C No results found for: "  HGBA1C"         Assessment & Plan:      RTC in 6 months for your annual exam Nicki Reaper, NP

## 2022-02-12 NOTE — Patient Instructions (Signed)
Managing Obsessive-Compulsive Disorder If you have been diagnosed with obsessive-compulsive disorder (OCD), you may be relieved that you now know why you have felt or behaved a certain way. You may also feel overwhelmed about the treatment ahead, how to get the support you need, and how to deal with the condition day-to-day. With treatment and support, you can manage your OCD. How to manage lifestyle changes Managing stress Stress is your body's reaction to life changes and events, both good and bad. Stress can play a major role in OCD, so it is important to learn how to manage stress. Some techniques to manage stress include: Meditation, muscle relaxation, and breathing exercises. Exercise. Even a short daily walk can help to lower stress levels. Getting enough high-quality sleep. Spending time on hobbies that you enjoy. Accepting and letting go of things that you cannot change. To help you manage stress associated with OCD, your health care provider may recommend a type of behavior therapy called exposure and response prevention therapy. In this therapy, you will be exposed to the distressing situation that triggers your compulsion and be prevented from responding to it. With repetition of this process over time, you will no longer feel the distress or need to perform the compulsion. Medicines Your health care provider may suggest certain medicines for depression (antidepressants) if it is felt that they will help to improve your condition. It is important to: Talk with your pharmacist or health care provider about all medicines that you take, their possible side effects, and which medicines are safe to take together. Make it your goal to take part in all treatment decisions (shared decision-making). Ask about possible side effects of medicines that your health care provider recommends, and tell him or her how you feel about having those side effects. It is best if shared decision-making with your  health care provider is part of your total treatment plan. Avoid using alcohol and other substances that may prevent your medicines from working properly. If you are taking medicines as part of your treatment, do not stop taking them before you ask your health care provider if it is safe to stop. You may need to have the medicine slowly decreased (tapered) over time to lower the risk of harmful side effects. Relationships Consider giving education materials to friends and family. Your family and friends may need to learn about your OCD in order for them to understand you and support you as you manage your condition. Family therapy may also help to lower stress and relieve tension. How to recognize changes in your condition Some signs that your condition may be getting worse include: Being anxious about germs or dirt. Having harmful thoughts about yourself or others. Making sure that household objects are alike or perfectly organized in a specific way. Having great difficulty making decisions, or second-guessing yourself after making a decision. Constant cleaning and handwashing. Repeating behavior such as repeatedly checking to see if a door is locked or the oven is off. Counting nonstop or uncontrollably. Follow these instructions at home: Medicines Take over-the-counter and prescription medicines only as told by your health care provider. Check with your health care provider before starting any new prescription or over-the-counter medicines. General instructions  Ask for support from trusted family members or friends to make sure you stay on track with your treatment. Keep a journal to write down your daily moods, medicines, sleep habits, and life events. Doing this may help you have more success with your treatment. Maintain a healthy lifestyle.  This includes: Eating a healthy diet. Avoiding alcohol. Avoiding products that contain nicotine and tobacco. Exercising regularly. Getting  plenty of sleep. Taking time to relax. Keep all follow-up visits. This includes visits with your health care provider and therapist. This is important. Where to find support Talking to others It may be difficult to tell family members and friends about your condition, but they can be a good support system for you. You can work with your therapist to decide whom to tell and when to tell them. Here are some tips for starting the conversation: Start by sharing your experience with OCD. It is up to you how much detail you want to provide. Let your family and friends know that you are seeking treatment. Do not expect family and friends to understand your condition right away. Finances Not all insurance plans cover mental health care, so it is important to check with your insurance carrier. If paying for co-pays or counseling services is a problem, search for a local or county mental health care center. Public mental health care services may be offered there at a low cost or no cost when you are not able to see a private health care provider. If you are taking medicine for depression, you may be able to get the generic form, which may be less expensive than brand-name medicine. Some makers of prescription medicines also offer help to people who cannot afford the medicines they need. Questions to ask your health care provider If you are taking medicines: How long do I need to take medicine? Are there any long-term side effects of my medicine? Are there any alternatives to taking medicine? How would I benefit from therapy? How often should I follow up with a health care provider? Where to find more information International OCD Foundation: www.iocdf.org National Alliance on Mental Illness (NAMI): www.nami.org Substance Abuse and Mental Health Services Administration (SAMHSA): www.samhsa.gov Contact a health care provider if: Your symptoms get worse or they do not get better with treatment. You  develop new symptoms. Get help right away if: You have severe side effects after taking your medicine. You have thoughts about hurting yourself or others. Get help right away if you feel like you may hurt yourself or others, or have thoughts about taking your own life. Go to your nearest emergency room or: Call 911. Call the National Suicide Prevention Lifeline at 1-800-273-8255 or 988. This is open 24 hours a day. Text the Crisis Text Line at 741741. Summary Stress can play a major role in obsessive-compulsive disorder (OCD). Learning ways to manage stress may help your treatment work better for you. If you are taking medicines as part of your treatment, do not stop taking medicines before you ask your health care provider if it is safe to stop. When talking with family members and friends about your OCD, decide how much detail you want to give them and be patient as they work to understand your condition. Keep all follow-up visits. This includes visits with your health care provider and therapist. This is important. This information is not intended to replace advice given to you by your health care provider. Make sure you discuss any questions you have with your health care provider. Document Revised: 11/26/2020 Document Reviewed: 11/26/2020 Elsevier Patient Education  2023 Elsevier Inc.  

## 2022-02-12 NOTE — Assessment & Plan Note (Signed)
CBC and iron panel today Continue oral iron 

## 2022-02-12 NOTE — Assessment & Plan Note (Signed)
Persistent Continue fluvoxamine and bupropion We will add hydroxyzine as needed for panic attacks Support offered

## 2022-02-12 NOTE — Assessment & Plan Note (Signed)
Did not tolerate ropinirole, will discontinue

## 2022-02-12 NOTE — Assessment & Plan Note (Signed)
Mainly obsessive thoughts Doing well on increased dose of fluvoxamine Support offered

## 2022-02-13 LAB — COMPLETE METABOLIC PANEL WITH GFR
AG Ratio: 1.4 (calc) (ref 1.0–2.5)
ALT: 15 U/L (ref 6–29)
AST: 16 U/L (ref 10–30)
Albumin: 4.4 g/dL (ref 3.6–5.1)
Alkaline phosphatase (APISO): 55 U/L (ref 31–125)
BUN: 16 mg/dL (ref 7–25)
CO2: 24 mmol/L (ref 20–32)
Calcium: 9.3 mg/dL (ref 8.6–10.2)
Chloride: 103 mmol/L (ref 98–110)
Creat: 0.77 mg/dL (ref 0.50–0.97)
Globulin: 3.1 g/dL (calc) (ref 1.9–3.7)
Glucose, Bld: 93 mg/dL (ref 65–99)
Potassium: 4.7 mmol/L (ref 3.5–5.3)
Sodium: 137 mmol/L (ref 135–146)
Total Bilirubin: 0.4 mg/dL (ref 0.2–1.2)
Total Protein: 7.5 g/dL (ref 6.1–8.1)
eGFR: 104 mL/min/{1.73_m2} (ref >=60)

## 2022-02-13 LAB — CBC
HCT: 39.4 % (ref 35.0–45.0)
Hemoglobin: 13.1 g/dL (ref 11.7–15.5)
MCH: 28.3 pg (ref 27.0–33.0)
MCHC: 33.2 g/dL (ref 32.0–36.0)
MCV: 85.1 fL (ref 80.0–100.0)
MPV: 9.9 fL (ref 7.5–12.5)
Platelets: 307 10*3/uL (ref 140–400)
RBC: 4.63 10*6/uL (ref 3.80–5.10)
RDW: 12.7 % (ref 11.0–15.0)
WBC: 5 10*3/uL (ref 3.8–10.8)

## 2022-02-13 LAB — IRON,TIBC AND FERRITIN PANEL
%SAT: 19 % (calc) (ref 16–45)
Ferritin: 20 ng/mL (ref 16–154)
Iron: 70 ug/dL (ref 40–190)
TIBC: 365 mcg/dL (calc) (ref 250–450)

## 2022-02-13 LAB — VITAMIN D 25 HYDROXY (VIT D DEFICIENCY, FRACTURES): Vit D, 25-Hydroxy: 26 ng/mL — ABNORMAL LOW (ref 30–100)

## 2022-02-27 ENCOUNTER — Ambulatory Visit
Admission: EM | Admit: 2022-02-27 | Discharge: 2022-02-27 | Disposition: A | Discharge status: Home / Self Care | Payer: Medicaid Other | Attending: Urgent Care | Admitting: Urgent Care

## 2022-02-27 ENCOUNTER — Other Ambulatory Visit: Payer: Self-pay | Admitting: Internal Medicine

## 2022-02-27 DIAGNOSIS — Z1152 Encounter for screening for COVID-19: Secondary | ICD-10-CM | POA: Insufficient documentation

## 2022-02-27 DIAGNOSIS — F419 Anxiety disorder, unspecified: Secondary | ICD-10-CM

## 2022-02-27 DIAGNOSIS — J45909 Unspecified asthma, uncomplicated: Secondary | ICD-10-CM | POA: Diagnosis not present

## 2022-02-27 DIAGNOSIS — D500 Iron deficiency anemia secondary to blood loss (chronic): Secondary | ICD-10-CM | POA: Diagnosis not present

## 2022-02-27 DIAGNOSIS — R509 Fever, unspecified: Secondary | ICD-10-CM | POA: Diagnosis not present

## 2022-02-27 DIAGNOSIS — R6889 Other general symptoms and signs: Secondary | ICD-10-CM | POA: Diagnosis present

## 2022-02-27 DIAGNOSIS — F32A Depression, unspecified: Secondary | ICD-10-CM | POA: Diagnosis not present

## 2022-02-27 LAB — RESP PANEL BY RT-PCR (FLU A&B, COVID) ARPGX2
Influenza A by PCR: NEGATIVE
Influenza B by PCR: NEGATIVE
SARS Coronavirus 2 by RT PCR: NEGATIVE

## 2022-02-27 MED ORDER — OSELTAMIVIR PHOSPHATE 75 MG PO CAPS: 75.0000 mg | ORAL_CAPSULE | Freq: Two times a day (BID) | ORAL | 0 refills | Status: DC

## 2022-02-27 MED ORDER — ONDANSETRON 4 MG PO TBDP: 4.0000 mg | ORAL_TABLET | Freq: Three times a day (TID) | ORAL | 0 refills | Status: DC | PRN

## 2022-02-27 NOTE — Telephone Encounter (Signed)
Requested Prescriptions  Pending Prescriptions Disp Refills   buPROPion (WELLBUTRIN XL) 300 MG 24 hr tablet [Pharmacy Med Name: BUPROPION HCL XL 300 MG TABLET] 90 tablet 1    Sig: TAKE 1 TABLET BY MOUTH EVERY DAY     Psychiatry: Antidepressants - bupropion Passed - 02/27/2022 11:31 AM      Passed - Cr in normal range and within 360 days    Creat  Date Value Ref Range Status  02/12/2022 0.77 0.50 - 0.97 mg/dL Final         Passed - AST in normal range and within 360 days    AST  Date Value Ref Range Status  02/12/2022 16 10 - 30 U/L Final         Passed - ALT in normal range and within 360 days    ALT  Date Value Ref Range Status  02/12/2022 15 6 - 29 U/L Final         Passed - Completed PHQ-2 or PHQ-9 in the last 360 days      Passed - Last BP in normal range    BP Readings from Last 1 Encounters:  02/12/22 122/74         Passed - Valid encounter within last 6 months    Recent Outpatient Visits           2 weeks ago Anxiety and depression   [facility] [place], Salvadore Oxford, NP   8 months ago Restless legs syndrome (RLS)   [facility] [place], Salvadore Oxford, NP   1 year ago Anxiety and depression   [facility] [place], Salvadore Oxford, NP

## 2022-02-27 NOTE — ED Triage Notes (Signed)
Pt presents with fever and body aches that started suddenly this afternoon.  Denies sore throat, cough, other s/s.

## 2022-02-27 NOTE — Discharge Instructions (Addendum)
Follow up here or with your primary care provider if your symptoms are worsening or not improving.     

## 2022-02-27 NOTE — ED Provider Notes (Addendum)
[name]    CSN: [id number] Arrival date & time: 02/27/22  1655      History   Chief Complaint Chief Complaint  Patient presents with   Fever    HPI Lauren Griffith is a 31 y.o. female.    Fever   Patient presents to urgent care with report of fever and body ache starting suddenly this afternoon.  Denies other symptoms including sore throat, cough.  Denies nausea, vomiting, diarrhea.  Past Medical History:  Diagnosis Date   Anemia    Anxiety    takes prozac   Asthma    as a child   Depression    Iron deficiency anemia due to chronic blood loss 07/23/2020   Urinary tract infection     Patient Active Problem List   Diagnosis Date Noted   OCD (obsessive compulsive disorder) 02/12/2022   Restless legs syndrome 06/24/2021   Iron deficiency anemia due to chronic blood loss 07/23/2020   Anxiety and depression 01/16/2014    Past Surgical History:  Procedure Laterality Date   APPENDECTOMY     DILATION AND CURETTAGE OF UTERUS     LAPAROSCOPIC APPENDECTOMY N/A 03/22/2018   Procedure: APPENDECTOMY LAPAROSCOPIC;  Surgeon: Darnell Level, MD;  Location: [facility];  Service: General;  Laterality: N/A;    OB History     Gravida  6   Para  5   Term  5   Preterm      AB  1   Living  5      SAB      IAB  1   Ectopic      Multiple  0   Live Births  5            Home Medications    Prior to Admission medications   Medication Sig Start Date End Date Taking? Authorizing Provider  buPROPion (WELLBUTRIN XL) 300 MG 24 hr tablet TAKE 1 TABLET BY MOUTH EVERY DAY 02/27/22  Yes Baity, Salvadore Oxford, NP  Ferrous Sulfate (IRON) 325 (65 Fe) MG TABS Take 1 tablet by mouth daily.   Yes [provider]  fluvoxaMINE (LUVOX) 25 MG tablet Take 1 tablet (25 mg total) by mouth 2 (two) times daily. 02/12/22  Yes Lorre Munroe, NP  hydrOXYzine (ATARAX) 50 MG tablet Take 1 tablet (50 mg total) by mouth daily as needed. 02/12/22  Yes Baity, Salvadore Oxford, NP   Misc Natural Products (T-RELIEF CBD+13 SL) Place under the tongue.   Yes [provider]  Multiple Vitamin (MULTIVITAMIN) tablet Take 1 tablet by mouth daily.   Yes [provider]  Vitamin D, Ergocalciferol, (DRISDOL) 1.25 MG (50000 UNIT) CAPS capsule TAKE 1 CAPSULE BY MOUTH ONCE A WEEK. FOR 12 WEEKS. THEN START OTC VITAMIN D3 2,000 UNIT DAILY. 09/10/21  Yes Baity, Salvadore Oxford, NP    Family History Family History  Problem Relation Age of Onset   Diabetes Maternal Grandmother    Diabetes Maternal Grandfather    Diabetes Paternal Grandmother    Diabetes Paternal Grandfather     Social History Social History   Tobacco Use   Smoking status: Never   Smokeless tobacco: Never  Vaping Use   Vaping Use: Never used  Substance Use Topics   Alcohol use: No   Drug use: No     Allergies   Cinnamon   Review of Systems Review of Systems  Constitutional:  Positive for fever.     Physical Exam Triage Vital Signs ED  Triage Vitals [02/27/22 1800]  Enc Vitals Group     BP (!) 147/84     Pulse Rate (!) 123     Resp 18     Temp 99.5 F (37.5 C)     Temp Source Oral     SpO2 98 %     Weight      Height      Head Circumference      Peak Flow      Pain Score      Pain Loc      Pain Edu?      Excl. in [place]?    No data found.  Updated Vital Signs BP (!) 147/84 (BP Location: Left Arm)   Pulse (!) 123   Temp 99.5 F (37.5 C) (Oral)   Resp 18   LMP 01/31/2022 Comment: partner has had vasectomy  SpO2 98%   Visual Acuity Right Eye Distance:   Left Eye Distance:   Bilateral Distance:    Right Eye Near:   Left Eye Near:    Bilateral Near:     Physical Exam Vitals reviewed.  Constitutional:      Appearance: She is ill-appearing.  Cardiovascular:     Rate and Rhythm: Normal rate and regular rhythm.     Pulses: Normal pulses.     Heart sounds: Normal heart sounds.  Pulmonary:     Effort: Pulmonary effort is normal.     Breath sounds: Normal breath  sounds.  Skin:    General: Skin is warm and dry.  Neurological:     General: No focal deficit present.     Mental Status: She is alert and oriented to person, place, and time.  Psychiatric:        Mood and Affect: Mood normal.        Behavior: Behavior normal.      UC Treatments / Results  Labs (all labs ordered are listed, but only abnormal results are displayed) Labs Reviewed - No data to display  EKG   Radiology No results found.  Procedures Procedures (including critical care time)  Medications Ordered in UC Medications - No data to display  Initial Impression / Assessment and Plan / UC Course  I have reviewed the triage vital signs and the nursing notes.  Pertinent labs & imaging results that were available during my care of the patient were reviewed by me and considered in my medical decision making (see chart for details).   Patient is afebrile here with recent antipyretics. Satting well on room air. Overall is ill appearing, shivering. She appears well hydrated, without respiratory distress. Pulmonary exam is unremarkable.   Suspect viral process including influenza.  Prescribing Tamiflu for antiviral therapy while awaiting respiratory swab results.  Recommended use of OTC medication for symptom control.  She endorses nausea with Tamiflu when prescribed years ago for flu.  Offered to prescribe Zofran as prophylactic.  Final Clinical Impressions(s) / UC Diagnoses   Final diagnoses:  None   Discharge Instructions   None    ED Prescriptions   None    PDMP not reviewed this encounter.   Rose Phi, FNP 02/27/22 1827    Rose Phi, FNP 02/27/22 [place]

## 2022-08-01 ENCOUNTER — Other Ambulatory Visit: Payer: Self-pay | Admitting: Internal Medicine

## 2022-08-01 NOTE — Telephone Encounter (Signed)
Requested Prescriptions  Pending Prescriptions Disp Refills   fluvoxaMINE (LUVOX) 25 MG tablet [Pharmacy Med Name: FLUVOXAMINE MALEATE 25 MG TAB] 180 tablet 0    Sig: TAKE 1 TABLET BY MOUTH TWICE A DAY     Psychiatry:  Antidepressants - SSRI Passed - 08/01/2022 11:39 AM      Passed - Completed PHQ-2 or PHQ-9 in the last 360 days      Passed - Valid encounter within last 6 months    Recent Outpatient Visits           5 months ago Anxiety and depression   [place] [facility] [place], Salvadore Oxford, NP   1 year ago Restless legs syndrome (RLS)   [facility] [place], Salvadore Oxford, NP   2 years ago Anxiety and depression   [place] [facility] [place], Salvadore Oxford, [place]

## 2022-08-25 ENCOUNTER — Ambulatory Visit: Payer: Medicaid Other | Admitting: Internal Medicine

## 2022-08-25 VITALS — BP 104/60 | HR 84 | Temp 96.2°F | Wt 118.0 lb

## 2022-08-25 DIAGNOSIS — Z136 Encounter for screening for cardiovascular disorders: Secondary | ICD-10-CM

## 2022-08-25 DIAGNOSIS — G2581 Restless legs syndrome: Secondary | ICD-10-CM

## 2022-08-25 DIAGNOSIS — R41840 Attention and concentration deficit: Secondary | ICD-10-CM

## 2022-08-25 DIAGNOSIS — F99 Mental disorder, not otherwise specified: Secondary | ICD-10-CM

## 2022-08-25 DIAGNOSIS — F428 Other obsessive-compulsive disorder: Secondary | ICD-10-CM

## 2022-08-25 DIAGNOSIS — F419 Anxiety disorder, unspecified: Secondary | ICD-10-CM

## 2022-08-25 DIAGNOSIS — D500 Iron deficiency anemia secondary to blood loss (chronic): Secondary | ICD-10-CM

## 2022-08-25 DIAGNOSIS — F32A Depression, unspecified: Secondary | ICD-10-CM

## 2022-08-25 DIAGNOSIS — F5105 Insomnia due to other mental disorder: Secondary | ICD-10-CM | POA: Diagnosis not present

## 2022-08-25 MED ORDER — GUANFACINE HCL ER 1 MG PO TB24: 1.0000 mg | ORAL_TABLET | Freq: Every day | ORAL | 0 refills | Status: DC

## 2022-08-25 NOTE — Assessment & Plan Note (Signed)
She reports most sleep medications make her restless legs worse so she would prefer not to take these

## 2022-08-25 NOTE — Patient Instructions (Signed)

## 2022-08-25 NOTE — Assessment & Plan Note (Signed)
CBC and iron panel today 

## 2022-08-25 NOTE — Assessment & Plan Note (Signed)
Continue duloxetine, bupropion and hydroxyzine She will continue to meet with a therapist Support offered

## 2022-08-25 NOTE — Progress Notes (Signed)
Subjective:    Patient ID: Lauren Griffith, female    DOB: 03/31/1990, 32 y.o.   MRN: [id number]  HPI  Pt presents to the clinic today for follow-up of chronic conditions.  Anxiety, Depression, OCD: Chronic, managed with Fluvoxamine, Hydroxyzine and Bupropion.  She is seeing a therapist.  She denies SI/HI.  RLS: She is not currently taking any medications for this.  She failed Ropinirole in the past due to side effects.  She does not follow with neurology.  Insomnia: She is having difficulty falling and staying asleep. She takes Hydroxyzine as needed but she reports it intermittently helps. She has taken Ambien in the past.  Iron Deficiency Anemia: Her last H/H is 13.1/39.4, 01/2022.  She is taking oral Iron as prescribed.  She follows with hematology.  She does have concerns that she has ADD. She is forgetful, has difficulty remembering what she was going to say. She reports lack of motivation. She does not feel like she is hyperactive.  Review of Systems     Past Medical History:  Diagnosis Date   Anemia    Anxiety    takes prozac   Asthma    as a child   Depression    Iron deficiency anemia due to chronic blood loss 07/23/2020   Urinary tract infection     Current Outpatient Medications  Medication Sig Dispense Refill   buPROPion (WELLBUTRIN XL) 300 MG 24 hr tablet TAKE 1 TABLET BY MOUTH EVERY DAY 90 tablet 1   Ferrous Sulfate (IRON) 325 (65 Fe) MG TABS Take 1 tablet by mouth daily.     fluvoxaMINE (LUVOX) 25 MG tablet TAKE 1 TABLET BY MOUTH TWICE A DAY 180 tablet 0   hydrOXYzine (ATARAX) 50 MG tablet Take 1 tablet (50 mg total) by mouth daily as needed. 90 tablet 1   Misc Natural Products (T-RELIEF CBD+13 SL) Place under the tongue.     Multiple Vitamin (MULTIVITAMIN) tablet Take 1 tablet by mouth daily.     ondansetron (ZOFRAN-ODT) 4 MG disintegrating tablet Take 1 tablet (4 mg total) by mouth every 8 (eight) hours as needed for nausea or vomiting. 20 tablet 0    oseltamivir (TAMIFLU) 75 MG capsule Take 1 capsule (75 mg total) by mouth every 12 (twelve) hours. 10 capsule 0   Vitamin D, Ergocalciferol, (DRISDOL) 1.25 MG (50000 UNIT) CAPS capsule TAKE 1 CAPSULE BY MOUTH ONCE A WEEK. FOR 12 WEEKS. THEN START OTC VITAMIN D3 2,000 UNIT DAILY. 12 capsule 0   No current facility-administered medications for this visit.    Allergies  Allergen Reactions   Cinnamon Swelling and Other (See Comments)    Mouth swelling. Pt states it is a minor allergy     Family History  Problem Relation Age of Onset   Diabetes Maternal Grandmother    Diabetes Maternal Grandfather    Diabetes Paternal Grandmother    Diabetes Paternal Grandfather     Social History   Socioeconomic History   Marital status: Single    Spouse name: Not on file   Number of children: Not on file   Years of education: Not on file   Highest education level: Not on file  Occupational History   Not on file  Tobacco Use   Smoking status: Never   Smokeless tobacco: Never  Vaping Use   Vaping Use: Never used  Substance and Sexual Activity   Alcohol use: No   Drug use: No   Sexual activity: Yes  Birth control/protection: None  Other Topics Concern   Not on file  Social History Narrative   Not on file   Social Determinants of Health   Financial Resource Strain: Low Risk  (08/18/2018)   Overall Financial Resource Strain (CARDIA)    Difficulty of Paying Living Expenses: Not hard at all  Food Insecurity: No Food Insecurity (08/18/2018)   Hunger Vital Sign    Worried About Running Out of Food in the Last Year: Never true    Ran Out of Food in the Last Year: Never true  Transportation Needs: Unknown (08/18/2018)   PRAPARE - Administrator, Civil Service (Medical): No    Lack of Transportation (Non-Medical): Not on file  Physical Activity: Not on file  Stress: Stress Concern Present (08/18/2018)   [employer] of Occupational Health - Occupational Stress Questionnaire     Feeling of Stress : To some extent  Social Connections: Not on file  Intimate Partner Violence: Not At Risk (08/18/2018)   Humiliation, Afraid, Rape, and Kick questionnaire    Fear of Current or Ex-Partner: No    Emotionally Abused: No    Physically Abused: No    Sexually Abused: No     Constitutional: Denies fever, malaise, fatigue, headache or abrupt weight changes.  HEENT: Denies eye pain, eye redness, ear pain, ringing in the ears, wax buildup, runny nose, nasal congestion, bloody nose, or sore throat. Respiratory: Denies difficulty breathing, shortness of breath, cough or sputum production.   Cardiovascular: Denies chest pain, chest tightness, palpitations or swelling in the hands or feet.  Gastrointestinal: Denies abdominal pain, bloating, constipation, diarrhea or blood in the stool.  GU: Denies urgency, frequency, pain with urination, burning sensation, blood in urine, odor or discharge. Musculoskeletal: Denies decrease in range of motion, difficulty with gait, muscle pain or joint pain and swelling.  Skin: Denies redness, rashes, lesions or ulcercations.  Neurological: Patient reports restless legs, inattention.  Denies dizziness, difficulty with memory, difficulty with speech or problems with balance and coordination.  Psych: Patient has a history of anxiety and depression.  Denies SI/HI.  No other specific complaints in a complete review of systems (except as listed in HPI above).  Objective:   Physical Exam BP 104/60 (BP Location: Left Arm, Patient Position: Sitting, Cuff Size: Normal)   Pulse 84   Temp (!) 96.2 F (35.7 C) (Temporal)   Wt 118 lb (53.5 kg)   SpO2 98%   BMI 20.90 kg/m   Wt Readings from Last 3 Encounters:  02/12/22 120 lb (54.4 kg)  01/31/22 125 lb (56.7 kg)  08/22/21 130 lb (59 kg)    General: Appears her stated age, well developed, well nourished in NAD. Cardiovascular: Normal rate and rhythm. S1,S2 noted.  No murmur, rubs or gallops noted.   Pulmonary/Chest: Normal effort and positive vesicular breath sounds. No respiratory distress. No wheezes, rales or ronchi noted.  Neurological: Alert and oriented.  Coordination normal.  Psychiatric: Mood and affect normal. Mildly anxious appearing. Judgment and thought content normal.     BMET    Component Value Date/Time   NA 137 02/12/2022 0853   K 4.7 02/12/2022 0853   CL 103 02/12/2022 0853   CO2 24 02/12/2022 0853   GLUCOSE 93 02/12/2022 0853   BUN 16 02/12/2022 0853   CREATININE 0.77 02/12/2022 0853   CALCIUM 9.3 02/12/2022 0853   GFRNONAA >60 08/22/2021 0934   GFRAA >60 06/06/2019 1934    Lipid Panel  No results found for: "  CHOL", "TRIG", "HDL", "CHOLHDL", "VLDL", "LDLCALC"  CBC    Component Value Date/Time   WBC 5.0 02/12/2022 0853   RBC 4.63 02/12/2022 0853   HGB 13.1 02/12/2022 0853   HCT 39.4 02/12/2022 0853   PLT 307 02/12/2022 0853   MCV 85.1 02/12/2022 0853   MCH 28.3 02/12/2022 0853   MCHC 33.2 02/12/2022 0853   RDW 12.7 02/12/2022 0853   LYMPHSABS 1.4 08/22/2021 0934   MONOABS 0.4 08/22/2021 0934   EOSABS 0.1 08/22/2021 0934   BASOSABS 0.0 08/22/2021 0934    Hgb A1C No results found for: "HGBA1C"          Assessment & Plan:   Inattention:  Referral to psychiatry for medication management Advised her she may get in sooner with  attention specialist in [place] trial Intuniv 1 mg daily  RTC in 6 months for your annual exam Nicki Reaper, NP

## 2022-08-25 NOTE — Assessment & Plan Note (Signed)
Not medicated 

## 2022-08-25 NOTE — Assessment & Plan Note (Signed)
Continue fluvoxamine, bupropion and hydroxyzine She will continue to meet with her therapist Support offered

## 2022-08-26 LAB — COMPLETE METABOLIC PANEL WITH GFR
AG Ratio: 1.6 (calc) (ref 1.0–2.5)
ALT: 7 U/L (ref 6–29)
AST: 3 U/L — ABNORMAL LOW (ref 10–30)
Albumin: 4.4 g/dL (ref 3.6–5.1)
Alkaline phosphatase (APISO): 50 U/L (ref 31–125)
BUN: 16 mg/dL (ref 7–25)
CO2: 29 mmol/L (ref 20–32)
Calcium: 9.5 mg/dL (ref 8.6–10.2)
Chloride: 101 mmol/L (ref 98–110)
Creat: 0.88 mg/dL (ref 0.50–0.97)
Globulin: 2.8 g/dL (calc) (ref 1.9–3.7)
Glucose, Bld: 87 mg/dL (ref 65–139)
Potassium: 4.1 mmol/L (ref 3.5–5.3)
Sodium: 140 mmol/L (ref 135–146)
Total Bilirubin: 0.4 mg/dL (ref 0.2–1.2)
Total Protein: 7.2 g/dL (ref 6.1–8.1)
eGFR: 88 mL/min/{1.73_m2} (ref >=60)

## 2022-08-26 LAB — CBC
HCT: 38 % (ref 35.0–45.0)
Hemoglobin: 12.6 g/dL (ref 11.7–15.5)
MCH: 27.9 pg (ref 27.0–33.0)
MCHC: 33.2 g/dL (ref 32.0–36.0)
MCV: 84.1 fL (ref 80.0–100.0)
MPV: 9.8 fL (ref 7.5–12.5)
Platelets: 311 10*3/uL (ref 140–400)
RBC: 4.52 10*6/uL (ref 3.80–5.10)
RDW: 13 % (ref 11.0–15.0)
WBC: 6.6 10*3/uL (ref 3.8–10.8)

## 2022-08-26 LAB — IRON,TIBC AND FERRITIN PANEL
%SAT: 25 % (calc) (ref 16–45)
Ferritin: 18 ng/mL (ref 16–154)
Iron: 92 ug/dL (ref 40–190)
TIBC: 363 mcg/dL (calc) (ref 250–450)

## 2022-08-26 LAB — LIPID PANEL
Cholesterol: 166 mg/dL (ref <200)
HDL: 66 mg/dL (ref >=50)
LDL Cholesterol (Calc): 78 mg/dL (calc)
Non-HDL Cholesterol (Calc): 100 mg/dL (calc) (ref <130)
Total CHOL/HDL Ratio: 2.5 (calc) (ref <5.0)
Triglycerides: 122 mg/dL (ref <150)

## 2022-08-28 ENCOUNTER — Encounter: Payer: Self-pay | Admitting: Internal Medicine

## 2022-09-16 ENCOUNTER — Other Ambulatory Visit: Payer: Self-pay | Admitting: Internal Medicine

## 2022-09-17 ENCOUNTER — Other Ambulatory Visit: Payer: Self-pay | Admitting: Internal Medicine

## 2022-09-17 NOTE — Telephone Encounter (Signed)
Requested Prescriptions  Pending Prescriptions Disp Refills   hydrOXYzine (ATARAX) 50 MG tablet [Pharmacy Med Name: HYDROXYZINE HCL 50 MG TABLET] 30 tablet 5    Sig: TAKE 1 TABLET BY MOUTH DAILY AS NEEDED.     Ear, Nose, and Throat:  Antihistamines 2 Passed - 09/17/2022  2:21 AM      Passed - Cr in normal range and within 360 days    Creat  Date Value Ref Range Status  08/25/2022 0.88 0.50 - 0.97 mg/dL Final         Passed - Valid encounter within last 12 months    Recent Outpatient Visits           3 weeks ago Inattention   [place] [facility] [place], Salvadore Oxford, NP   7 months ago Anxiety and depression   [place] [facility] [place], Salvadore Oxford, NP   1 year ago Restless legs syndrome (RLS)   [place] [facility] [place], Salvadore Oxford, NP   2 years ago Anxiety and depression   [place] [facility] [place], Salvadore Oxford, NP       Future Appointments             In 5 months Baity, Salvadore Oxford, NP [place] [facility], [facility]

## 2022-09-17 NOTE — Telephone Encounter (Signed)
Requested medication (s) are due for refill today: No  Requested medication (s) are on the active medication list: Yes  Last refill:  08/25/22  Future visit scheduled: Yes  Notes to clinic:  Pharmacy requests 90 day supply.    Requested Prescriptions  Pending Prescriptions Disp Refills   guanFACINE (INTUNIV) 1 MG TB24 ER tablet [Pharmacy Med Name: GUANFACINE HCL ER 1 MG TABLET] 90 tablet 1    Sig: TAKE 1 TABLET BY MOUTH EVERY DAY     Cardiovascular: Alpha-2 Agonists - guanfacine Passed - 09/16/2022  1:31 PM      Passed - Last BP in normal range    BP Readings from Last 1 Encounters:  08/25/22 104/60         Passed - Last Heart Rate in normal range    Pulse Readings from Last 1 Encounters:  08/25/22 84         Passed - Valid encounter within last 6 months    Recent Outpatient Visits           3 weeks ago Inattention   [place] [facility] [place], Salvadore Oxford, NP   7 months ago Anxiety and depression   [place] [facility] [place], Salvadore Oxford, NP   1 year ago Restless legs syndrome (RLS)   [facility] [place], Salvadore Oxford, NP   2 years ago Anxiety and depression   [place] [facility] [place], Salvadore Oxford, NP       Future Appointments             In 5 months Baity, Salvadore Oxford, NP [place] [facility], [facility]

## 2022-09-22 ENCOUNTER — Encounter: Payer: Self-pay | Admitting: Internal Medicine

## 2022-09-22 NOTE — Telephone Encounter (Signed)
Duplicate message.  Pt will need to schedule an appointment.    Thank you,   -Vernona Rieger

## 2022-10-22 ENCOUNTER — Other Ambulatory Visit: Payer: Self-pay | Admitting: Internal Medicine

## 2022-10-23 NOTE — Telephone Encounter (Signed)
Requested medications are due for refill today.  no  Requested medications are on the active medications list.  yes  Last refill. 09/17/2022 #30 5 rf  Future visit scheduled.   yes  Notes to clinic.  Pharmacy is request a 90 day prescription.    Requested Prescriptions  Pending Prescriptions Disp Refills   hydrOXYzine (ATARAX) 50 MG tablet [Pharmacy Med Name: HYDROXYZINE HCL 50 MG TABLET] 90 tablet 2    Sig: TAKE 1 TABLET BY MOUTH EVERY DAY AS NEEDED     Ear, Nose, and Throat:  Antihistamines 2 Passed - 10/22/2022  2:33 PM      Passed - Cr in normal range and within 360 days    Creat  Date Value Ref Range Status  08/25/2022 0.88 0.50 - 0.97 mg/dL Final         Passed - Valid encounter within last 12 months    Recent Outpatient Visits           1 month ago Inattention   [place] [facility] [place], Salvadore Oxford, NP   8 months ago Anxiety and depression   [place] [facility] [place], Salvadore Oxford, NP   1 year ago Restless legs syndrome (RLS)   [place] [facility] [place], Salvadore Oxford, NP   2 years ago Anxiety and depression   [place] [facility] [place], Salvadore Oxford, NP       Future Appointments             In 4 months Baity, Salvadore Oxford, NP  [facility] [facility], [facility]

## 2022-10-27 ENCOUNTER — Encounter: Payer: Self-pay | Admitting: Oncology

## 2022-11-16 ENCOUNTER — Other Ambulatory Visit: Payer: Self-pay | Admitting: Internal Medicine

## 2022-11-18 NOTE — Telephone Encounter (Signed)
Requested Prescriptions  Pending Prescriptions Disp Refills   fluvoxaMINE (LUVOX) 25 MG tablet [Pharmacy Med Name: FLUVOXAMINE MALEATE 25 MG TAB] 180 tablet 0    Sig: TAKE 1 TABLET BY MOUTH TWICE A DAY     Psychiatry:  Antidepressants - SSRI Passed - 11/16/2022  1:16 AM      Passed - Completed PHQ-2 or PHQ-9 in the last 360 days      Passed - Valid encounter within last 6 months    Recent Outpatient Visits           2 months ago Inattention   [place] [facility] [place], Salvadore Oxford, NP   9 months ago Anxiety and depression   [place] [facility] [place], Salvadore Oxford, NP   1 year ago Restless legs syndrome (RLS)   [place] [facility] [place], Salvadore Oxford, NP   2 years ago Anxiety and depression   [place] [facility] [place], Salvadore Oxford, NP       Future Appointments             In 3 months Baity, Salvadore Oxford, NP [place] [facility], [facility]

## 2022-11-26 ENCOUNTER — Other Ambulatory Visit: Payer: Self-pay | Admitting: Internal Medicine

## 2022-11-26 DIAGNOSIS — F32A Depression, unspecified: Secondary | ICD-10-CM

## 2022-11-27 NOTE — Telephone Encounter (Signed)
Requested Prescriptions  Pending Prescriptions Disp Refills   buPROPion (WELLBUTRIN XL) 300 MG 24 hr tablet [Pharmacy Med Name: BUPROPION HCL XL 300 MG TABLET] 90 tablet 1    Sig: TAKE 1 TABLET BY MOUTH EVERY DAY     Psychiatry: Antidepressants - bupropion Failed - 11/26/2022  5:40 PM      Failed - AST in normal range and within 360 days    AST  Date Value Ref Range Status  08/25/2022 3 (L) 10 - 30 U/L Final    Comment:    Verified by repeat analysis. .          Passed - Cr in normal range and within 360 days    Creat  Date Value Ref Range Status  08/25/2022 0.88 0.50 - 0.97 mg/dL Final         Passed - ALT in normal range and within 360 days    ALT  Date Value Ref Range Status  08/25/2022 7 6 - 29 U/L Final         Passed - Completed PHQ-2 or PHQ-9 in the last 360 days      Passed - Last BP in normal range    BP Readings from Last 1 Encounters:  08/25/22 104/60         Passed - Valid encounter within last 6 months    Recent Outpatient Visits           3 months ago Inattention   [place] [facility] [place], Salvadore Oxford, NP   9 months ago Anxiety and depression   [place] [facility] [place], Salvadore Oxford, NP   1 year ago Restless legs syndrome (RLS)   [place] [facility] [place], Salvadore Oxford, NP   2 years ago Anxiety and depression   [place] [place], Salvadore Oxford, NP       Future Appointments             In 2 months Baity, Salvadore Oxford, NP [place] [facility], [facility]

## 2022-12-03 ENCOUNTER — Other Ambulatory Visit: Payer: Self-pay | Admitting: Internal Medicine

## 2022-12-03 ENCOUNTER — Encounter (HOSPITAL_COMMUNITY): Payer: Self-pay

## 2022-12-03 DIAGNOSIS — F419 Anxiety disorder, unspecified: Secondary | ICD-10-CM

## 2022-12-04 ENCOUNTER — Encounter (HOSPITAL_COMMUNITY): Payer: Self-pay

## 2022-12-04 ENCOUNTER — Telehealth (HOSPITAL_COMMUNITY): Payer: Self-pay | Admitting: Clinical

## 2022-12-04 ENCOUNTER — Ambulatory Visit (HOSPITAL_COMMUNITY): Payer: Medicaid Other | Admitting: Clinical

## 2022-12-04 NOTE — Telephone Encounter (Signed)
Therapist sent the client a link for the schedued mychart video visit to complete a new patient assessment. Client did not answer the link. Therapist attempted phone call to the client. Client did not answer. Therapist left a voicemail for the client to call back and reschedule.

## 2022-12-04 NOTE — Telephone Encounter (Signed)
Rx 11/27/22 #90 - too soon Requested Prescriptions  Pending Prescriptions Disp Refills   buPROPion (WELLBUTRIN XL) 300 MG 24 hr tablet [Pharmacy Med Name: BUPROPION HCL XL 300 MG TABLET] 90 tablet 0    Sig: TAKE 1 TABLET BY MOUTH EVERY DAY     Psychiatry: Antidepressants - bupropion Failed - 12/03/2022  5:35 PM      Failed - AST in normal range and within 360 days    AST  Date Value Ref Range Status  08/25/2022 3 (L) 10 - 30 U/L Final    Comment:    Verified by repeat analysis. .          Passed - Cr in normal range and within 360 days    Creat  Date Value Ref Range Status  08/25/2022 0.88 0.50 - 0.97 mg/dL Final         Passed - ALT in normal range and within 360 days    ALT  Date Value Ref Range Status  08/25/2022 7 6 - 29 U/L Final         Passed - Completed PHQ-2 or PHQ-9 in the last 360 days      Passed - Last BP in normal range    BP Readings from Last 1 Encounters:  08/25/22 104/60         Passed - Valid encounter within last 6 months    Recent Outpatient Visits           3 months ago Inattention   [place] [facility] [place], Salvadore Oxford, NP   9 months ago Anxiety and depression   [place] [facility] [place], Salvadore Oxford, NP   1 year ago Restless legs syndrome (RLS)   [place] [facility] [place], Salvadore Oxford, NP   2 years ago Anxiety and depression   [place] [facility] [place], Salvadore Oxford, NP       Future Appointments             In 2 months Baity, Salvadore Oxford, NP [place] [facility], [facility]

## 2022-12-18 NOTE — Progress Notes (Deleted)
Psychiatric Initial Adult Assessment  Patient Identification: Lauren Griffith MRN:  [id number] Date of Evaluation:  12/18/2022 Referral Source: Nicki Reaper, NP  Assessment:  Lauren Griffith is a 32 y.o. female with a history of anxiety, depression, OCD, iron deficiency, and asthma *** who presents to [facility] via video conferencing for initial evaluation of ***.  Patient reports ***  Plan:  # *** Past medication trials:  Status of problem: *** Interventions: -- ***  # *** Past medication trials:  Status of problem: *** Interventions: -- ***  # *** Past medication trials:  Status of problem: *** Interventions: -- ***  Patient was given contact information for behavioral health clinic and was instructed to call 911 for emergencies.   Subjective:  Chief Complaint: No chief complaint on file.   History of Present Illness:  ***  Chart review: -- Referred by PCP in June 2024 for anxiety, depression, and OCD. Managed on fluvoxamine 25 mg BID, duloxetine, bupropion 300 mg daily, hydroxyzine 50 mg daily PRN. Started on Intuniv 1 mg daily at that time for inattention.    PDMP: -- Rx Adderall ER 10 mg capsule QTY 30 last filled 12/15/22 (rx dating back to 11/26/22)  Also on duloxetine, Adderall? Current meds RLS?  Rec oral iron - Iron replacement is suggested in patients with restless legs syndrome (RLS) whose fasting serum ferritin level is <=75 ng/mL Therapy  Past Psychiatric History:  Diagnoses: *** Medication trials: ***Prozac, buspirone, Xanax Previous psychiatrist/therapist: *** Hospitalizations: *** Suicide attempts: *** SIB: *** Hx of violence towards others: *** Current access to guns: *** Hx of trauma/abuse: ***  Previous Psychotropic Medications: {YES/NO:21197}  Substance Abuse History in the last 12 months:  {yes no:314532}  Past Medical History:  Past Medical History:  Diagnosis Date   Anemia    Anxiety    takes prozac    Asthma    as a child   Depression    Iron deficiency anemia due to chronic blood loss 07/23/2020   Urinary tract infection     Past Surgical History:  Procedure Laterality Date   APPENDECTOMY     DILATION AND CURETTAGE OF UTERUS     LAPAROSCOPIC APPENDECTOMY N/A 03/22/2018   Procedure: APPENDECTOMY LAPAROSCOPIC;  Surgeon: Darnell Level, MD;  Location: [facility];  Service: General;  Laterality: N/A;    Family Psychiatric History: ***  Family History:  Family History  Problem Relation Age of Onset   Diabetes Maternal Grandmother    Diabetes Maternal Grandfather    Diabetes Paternal Grandmother    Diabetes Paternal Grandfather     Social History:   Academic/Vocational: ***  Social History   Socioeconomic History   Marital status: Single    Spouse name: Not on file   Number of children: Not on file   Years of education: Not on file   Highest education level: Some college, no degree  Occupational History   Not on file  Tobacco Use   Smoking status: Never   Smokeless tobacco: Never  Vaping Use   Vaping status: Never Used  Substance and Sexual Activity   Alcohol use: No   Drug use: No   Sexual activity: Yes    Birth control/protection: None  Other Topics Concern   Not on file  Social History Narrative   Not on file   Social Determinants of Health   Financial Resource Strain: High Risk (08/25/2022)   Overall Financial Resource Strain (CARDIA)    Difficulty of Paying Living Expenses:  Hard  Food Insecurity: Food Insecurity Present (08/25/2022)   Hunger Vital Sign    Worried About Running Out of Food in the Last Year: Sometimes true    Ran Out of Food in the Last Year: Never true  Transportation Needs: No Transportation Needs (08/25/2022)   PRAPARE - Administrator, Civil Service (Medical): No    Lack of Transportation (Non-Medical): No  Physical Activity: Insufficiently Active (08/25/2022)   Exercise Vital Sign    Days of Exercise per Week: 2 days     Minutes of Exercise per Session: 20 min  Stress: Stress Concern Present (08/25/2022)   [employer] of Occupational Health - Occupational Stress Questionnaire    Feeling of Stress : Very much  Social Connections: Moderately Integrated (08/25/2022)   Social Connection and Isolation Panel [NHANES]    Frequency of Communication with Friends and Family: Twice a week    Frequency of Social Gatherings with Friends and Family: Once a week    Attends Religious Services: More than 4 times per year    Active Member of [employer] or Organizations: Yes    Attends Banker Meetings: 1 to 4 times per year    Marital Status: Divorced    Additional Social History: updated  Allergies:   Allergies  Allergen Reactions   Cinnamon Swelling and Other (See Comments)    Mouth swelling. Pt states it is a minor allergy     Current Medications: Current Outpatient Medications  Medication Sig Dispense Refill   buPROPion (WELLBUTRIN XL) 300 MG 24 hr tablet TAKE 1 TABLET BY MOUTH EVERY DAY 90 tablet 0   Ferrous Sulfate (IRON) 325 (65 Fe) MG TABS Take 1 tablet by mouth daily.     fluvoxaMINE (LUVOX) 25 MG tablet TAKE 1 TABLET BY MOUTH TWICE A DAY 180 tablet 0   guanFACINE (INTUNIV) 1 MG TB24 ER tablet TAKE 1 TABLET BY MOUTH EVERY DAY 90 tablet 0   hydrOXYzine (ATARAX) 50 MG tablet TAKE 1 TABLET BY MOUTH EVERY DAY AS NEEDED 90 tablet 0   Misc Natural Products (T-RELIEF CBD+13 SL) Place under the tongue.     Multiple Vitamin (MULTIVITAMIN) tablet Take 1 tablet by mouth daily.     Vitamin D, Ergocalciferol, (DRISDOL) 1.25 MG (50000 UNIT) CAPS capsule TAKE 1 CAPSULE BY MOUTH ONCE A WEEK. FOR 12 WEEKS. THEN START OTC VITAMIN D3 2,000 UNIT DAILY. 12 capsule 0   No current facility-administered medications for this visit.    ROS: Review of Systems  Objective:  Psychiatric Specialty Exam: There were no vitals taken for this visit.There is no height or weight on file to calculate BMI.  General  Appearance: {Appearance:22683}  Eye Contact:  {BHH EYE CONTACT:22684}  Speech:  {Speech:22685}  Volume:  {Volume (PAA):22686}  Mood:  {BHH MOOD:22306}  Affect:  {Affect (PAA):22687}  Thought Content: {Thought Content:22690}   Suicidal Thoughts:  {ST/HT (PAA):22692}  Homicidal Thoughts:  {ST/HT (PAA):22692}  Thought Process:  {Thought Process (PAA):22688}  Orientation:  {BHH ORIENTATION (PAA):22689}    Memory: Grossly intact ***  Judgment:  {Judgement (PAA):22694}  Insight:  {Insight (PAA):22695}  Concentration:  {Concentration:21399}  Recall:  not formally assessed ***  Fund of Knowledge: {BHH GOOD/FAIR/POOR:22877}  Language: {BHH GOOD/FAIR/POOR:22877}  Psychomotor Activity:  {Psychomotor (PAA):22696}  Akathisia:  {BHH YES OR NO:22294}  AIMS (if indicated): {Desc; done/not:10129}  Assets:  {Assets (PAA):22698}  ADL's:  {BHH WUJ'W:11914}  Cognition: {chl bhh cognition:304700322}  Sleep:  {BHH GOOD/FAIR/POOR:22877}   PE: General: sits comfortably  in view of camera; no acute distress *** Pulm: no increased work of breathing on room air *** MSK: all extremity movements appear intact *** Neuro: no focal neurological deficits observed *** Gait & Station: unable to assess by video ***   Metabolic Disorder Labs: No results found for: "HGBA1C", "MPG" No results found for: "PROLACTIN" Lab Results  Component Value Date   CHOL 166 08/25/2022   TRIG 122 08/25/2022   HDL 66 08/25/2022   CHOLHDL 2.5 08/25/2022   LDLCALC 78 08/25/2022   Lab Results  Component Value Date   TSH 1.99 07/12/2020    Therapeutic Level Labs: No results found for: "LITHIUM" No results found for: "CBMZ" No results found for: "VALPROATE"  Screenings:  GAD-7    Flowsheet Row Office Visit from 08/25/2022 in [facility] Office Visit from 02/12/2022 in [facility] Office Visit from 06/24/2021 in [facility] Office Visit from  07/12/2020 in [facility] Video Visit from 08/29/2019 in [facility]  Total GAD-7 Score 21 17 12 18  0      PHQ2-9    Flowsheet Row Office Visit from 08/25/2022 in [facility] Office Visit from 02/12/2022 in [facility] Office Visit from 06/24/2021 in [facility] Office Visit from 07/12/2020 in [facility] Video Visit from 08/29/2019 in [facility] [place]  PHQ-2 Total Score 6 3 5 4  0  PHQ-9 Total Score 22 11 14 16  0      Flowsheet Row ED from 02/27/2022 in [facility]  ED from 01/31/2022 in [facility]  ED from 11/07/2021 in [facility]   C-SSRS RISK CATEGORY No Risk No Risk No Risk       Collaboration of Care: Collaboration of Care: [facility] OP Collaboration of Care:[id number]}  Patient/Guardian was advised Release of Information must be obtained prior to any record release in order to collaborate their care with an outside provider. Patient/Guardian was advised if they have not already done so to contact the registration department to sign all necessary forms in order for [place] to release information regarding their care.   Consent: Patient/Guardian gives verbal consent for treatment and assignment of benefits for services provided during this visit. Patient/Guardian expressed understanding and agreed to proceed.   Televisit via video: I connected with [name] on 12/18/22 at  9:00 AM EDT by a video enabled telemedicine application and verified that I am speaking with the correct person using two identifiers.  Location: Patient: *** Provider: remote office in [place]   I discussed the limitations of evaluation and management by telemedicine and the availability of in person appointments. The patient expressed  understanding and agreed to proceed.  I discussed the assessment and treatment plan with the patient. The patient was provided an opportunity to ask questions and all were answered. The patient agreed with the plan and demonstrated an understanding of the instructions.   The patient was advised to call back or seek an in-person evaluation if the symptoms worsen or if the condition fails to improve as anticipated.  I provided *** minutes dedicated to the care of this patient via video on the date of this encounter to include chart review, face-to-face time with the patient, medication management/counseling ***.  Branae Crail A  10/3/20241:29 PM

## 2022-12-22 ENCOUNTER — Ambulatory Visit (HOSPITAL_COMMUNITY): Payer: Self-pay | Admitting: Psychiatry

## 2023-02-24 ENCOUNTER — Encounter: Payer: Medicaid Other | Admitting: Internal Medicine

## 2023-02-24 NOTE — Progress Notes (Unsigned)
Subjective:    Patient ID: Lauren Griffith, female    DOB: [birth year], 32 y.o.   MRN: [id number]  HPI  Pt presents to the clinic today for her annual exam.  Flu: 9 Tetanus: 05/2018 COVID: Never Pap smear: 01/2018 Dentist:  Diet: Exercise:  Review of Systems     Past Medical History:  Diagnosis Date   Anemia    Anxiety    takes prozac   Asthma    as a child   Depression    Iron deficiency anemia due to chronic blood loss 07/23/2020   Urinary tract infection     Current Outpatient Medications  Medication Sig Dispense Refill   buPROPion (WELLBUTRIN XL) 300 MG 24 hr tablet TAKE 1 TABLET BY MOUTH EVERY DAY 90 tablet 0   Ferrous Sulfate (IRON) 325 (65 Fe) MG TABS Take 1 tablet by mouth daily.     fluvoxaMINE (LUVOX) 25 MG tablet TAKE 1 TABLET BY MOUTH TWICE A DAY 180 tablet 0   guanFACINE (INTUNIV) 1 MG TB24 ER tablet TAKE 1 TABLET BY MOUTH EVERY DAY 90 tablet 0   hydrOXYzine (ATARAX) 50 MG tablet TAKE 1 TABLET BY MOUTH EVERY DAY AS NEEDED 90 tablet 0   Misc Natural Products (T-RELIEF CBD+13 SL) Place under the tongue.     Multiple Vitamin (MULTIVITAMIN) tablet Take 1 tablet by mouth daily.     Vitamin D, Ergocalciferol, (DRISDOL) 1.25 MG (50000 UNIT) CAPS capsule TAKE 1 CAPSULE BY MOUTH ONCE A WEEK. FOR 12 WEEKS. THEN START OTC VITAMIN D3 2,000 UNIT DAILY. 12 capsule 0   No current facility-administered medications for this visit.    Allergies  Allergen Reactions   Cinnamon Swelling and Other (See Comments)    Mouth swelling. Pt states it is a minor allergy     Family History  Problem Relation Age of Onset   Diabetes Maternal Grandmother    Diabetes Maternal Grandfather    Diabetes Paternal Grandmother    Diabetes Paternal Grandfather     Social History   Socioeconomic History   Marital status: Single    Spouse name: Not on file   Number of children: Not on file   Years of education: Not on file   Highest education level: Some college, no degree   Occupational History   Not on file  Tobacco Use   Smoking status: Never   Smokeless tobacco: Never  Vaping Use   Vaping status: Never Used  Substance and Sexual Activity   Alcohol use: No   Drug use: No   Sexual activity: Yes    Birth control/protection: None  Other Topics Concern   Not on file  Social History Narrative   Not on file   Social Determinants of Health   Financial Resource Strain: High Risk (08/25/2022)   Overall Financial Resource Strain (CARDIA)    Difficulty of Paying Living Expenses: Hard  Food Insecurity: Food Insecurity Present (08/25/2022)   Hunger Vital Sign    Worried About Running Out of Food in the Last Year: Sometimes true    Ran Out of Food in the Last Year: Never true  Transportation Needs: No Transportation Needs (08/25/2022)   PRAPARE - Administrator, Civil Service (Medical): No    Lack of Transportation (Non-Medical): No  Physical Activity: Insufficiently Active (08/25/2022)   Exercise Vital Sign    Days of Exercise per Week: 2 days    Minutes of Exercise per Session: 20 min  Stress: Stress Concern Present (  08/25/2022)   [employer] [employer]ational [employer] - Occupational Stress Questionnaire    Feeling of Stress : Very much  Social Connections: Moderately Integrated (08/25/2022)   Social Connection and Isolation Panel [NHANES]    Frequency of Communication with Friends and Family: Twice a week    Frequency of Social Gatherings with Friends and Family: Once a week    Attends Religious Services: More than 4 times per year    Active Member of [employer] or Organizations: Yes    Attends Banker Meetings: 1 to 4 times per year    Marital Status: Divorced  Catering manager Violence: Not At Risk (08/18/2018)   Humiliation, Afraid, Rape, and Kick questionnaire    Fear of Current or Ex-Partner: No    Emotionally Abused: No    Physically Abused: No    Sexually Abused: No     Constitutional: Denies fever, malaise, fatigue,  headache or abrupt weight changes.  HEENT: Denies eye pain, eye redness, ear pain, ringing in the ears, wax buildup, runny nose, nasal congestion, bloody nose, or sore throat. Respiratory: Denies difficulty breathing, shortness of breath, cough or sputum production.   Cardiovascular: Denies chest pain, chest tightness, palpitations or swelling in the hands or feet.  Gastrointestinal: Denies abdominal pain, bloating, constipation, diarrhea or blood in the stool.  GU: Denies urgency, frequency, pain with urination, burning sensation, blood in urine, odor or discharge. Musculoskeletal: Denies decrease in range of motion, difficulty with gait, muscle pain or joint pain and swelling.  Skin: Denies redness, rashes, lesions or ulcercations.  Neurological: Patient reports restless legs, inattention.  Denies dizziness, difficulty with memory, difficulty with speech or problems with balance and coordination.  Psych: Patient has a history of anxiety and depression.  Denies SI/HI.  No other specific complaints in a complete review of systems (except as listed in HPI above).  Objective:   Physical Exam There were no vitals taken for this visit.  Wt Readings from Last 3 Encounters:  08/25/22 118 lb (53.5 kg)  02/12/22 120 lb (54.4 kg)  01/31/22 125 lb (56.7 kg)    General: Appears her stated age, well developed, well nourished in NAD. Cardiovascular: Normal rate and rhythm. S1,S2 noted.  No murmur, rubs or gallops noted.  Pulmonary/Chest: Normal effort and positive vesicular breath sounds. No respiratory distress. No wheezes, rales or ronchi noted.  Neurological: Alert and oriented.  Coordination normal.  Psychiatric: Mood and affect normal. Mildly anxious appearing. Judgment and thought content normal.     BMET    Component Value Date/Time   NA 140 08/25/2022 1357   K 4.1 08/25/2022 1357   CL 101 08/25/2022 1357   CO2 29 08/25/2022 1357   GLUCOSE 87 08/25/2022 1357   BUN 16 08/25/2022 1357    CREATININE 0.88 08/25/2022 1357   CALCIUM 9.5 08/25/2022 1357   GFRNONAA >60 08/22/2021 0934   GFRAA >60 06/06/2019 1934    Lipid Panel     Component Value Date/Time   CHOL 166 08/25/2022 1357   TRIG 122 08/25/2022 1357   HDL 66 08/25/2022 1357   CHOLHDL 2.5 08/25/2022 1357   LDLCALC 78 08/25/2022 1357    CBC    Component Value Date/Time   WBC 6.6 08/25/2022 1357   RBC 4.52 08/25/2022 1357   HGB 12.6 08/25/2022 1357   HCT 38.0 08/25/2022 1357   PLT 311 08/25/2022 1357   MCV 84.1 08/25/2022 1357   MCH 27.9 08/25/2022 1357   MCHC 33.2 08/25/2022 1357  RDW 13.0 08/25/2022 1357   LYMPHSABS 1.4 08/22/2021 0934   MONOABS 0.4 08/22/2021 0934   EOSABS 0.1 08/22/2021 0934   BASOSABS 0.0 08/22/2021 0934    Hgb A1C No results found for: "HGBA1C"          Assessment & Plan:   Preventative health maintenance:  Flu Tetanus UTD Encouraged her to get her COVID booster Pap smear Encouraged her to consume a balanced diet and exercise regimen Advised her to see an eye doctor and dentist annually Will check CBC, c-Met, lipid, A1c today  RTC in 6 months for follow of chronic conditions Nicki Reaper, NP

## 2023-06-23 ENCOUNTER — Other Ambulatory Visit: Payer: Self-pay | Admitting: Internal Medicine

## 2023-06-24 NOTE — Telephone Encounter (Signed)
 OV 08/25/22- over due f/u- courtesy given 30 day Requested Prescriptions  Pending Prescriptions Disp Refills   hydrOXYzine (ATARAX) 50 MG tablet [Pharmacy Med Name: HYDROXYZINE HCL 50 MG TABLET] 90 tablet 1    Sig: TAKE 1 TABLET BY MOUTH EVERY DAY AS NEEDED     Ear, Nose, and Throat:  Antihistamines 2 Failed - 06/24/2023  1:34 PM      Failed - Valid encounter within last 12 months    Recent Outpatient Visits   None            Passed - Cr in normal range and within 360 days    Creat  Date Value Ref Range Status  08/25/2022 0.88 0.50 - 0.97 mg/dL Final

## 2023-07-21 ENCOUNTER — Other Ambulatory Visit: Payer: Self-pay | Admitting: Internal Medicine

## 2023-07-23 NOTE — Telephone Encounter (Signed)
 Called patient to schedule CPE and for medication refills. No answer, left VM to call back.

## 2023-07-23 NOTE — Telephone Encounter (Signed)
 Unable to refill per protocol, courtesy refill already given, OV needed for additional refills.   Requested Prescriptions  Pending Prescriptions Disp Refills   hydrOXYzine  (ATARAX ) 50 MG tablet [Pharmacy Med Name: HYDROXYZINE  HCL 50 MG TABLET] 90 tablet 1    Sig: TAKE 1 TABLET BY MOUTH EVERY DAY AS NEEDED     Ear, Nose, and Throat:  Antihistamines 2 Failed - 07/23/2023  8:35 AM      Failed - Valid encounter within last 12 months    Recent Outpatient Visits   None            Passed - Cr in normal range and within 360 days    Creat  Date Value Ref Range Status  08/25/2022 0.88 0.50 - 0.97 mg/dL Final

## 2023-09-23 ENCOUNTER — Ambulatory Visit

## 2023-11-06 ENCOUNTER — Ambulatory Visit (INDEPENDENT_AMBULATORY_CARE_PROVIDER_SITE_OTHER): Admitting: General Practice

## 2023-11-06 ENCOUNTER — Encounter: Payer: Self-pay | Admitting: General Practice

## 2023-11-06 VITALS — BP 120/78 | HR 91 | Temp 98.0°F | Ht 62.15 in | Wt 108.0 lb

## 2023-11-06 DIAGNOSIS — F419 Anxiety disorder, unspecified: Secondary | ICD-10-CM | POA: Diagnosis not present

## 2023-11-06 DIAGNOSIS — Z7689 Persons encountering health services in other specified circumstances: Secondary | ICD-10-CM | POA: Insufficient documentation

## 2023-11-06 DIAGNOSIS — F909 Attention-deficit hyperactivity disorder, unspecified type: Secondary | ICD-10-CM

## 2023-11-06 DIAGNOSIS — F32A Depression, unspecified: Secondary | ICD-10-CM | POA: Diagnosis not present

## 2023-11-06 DIAGNOSIS — F428 Other obsessive-compulsive disorder: Secondary | ICD-10-CM

## 2023-11-06 NOTE — Assessment & Plan Note (Signed)
 EMR reviewed briefly.

## 2023-11-06 NOTE — Progress Notes (Signed)
 New Patient Office Visit  Subjective    Patient ID: Lauren Griffith, female    DOB: 10-Feb-1991  Age: 33 y.o. MRN: [id number]  CC:  Chief Complaint  Patient presents with   New Patient (Initial Visit)    Patient needs CPE and PAP done at next visit; wants to do pap here and not GYN.     HPI Lauren Griffith is a 33 y.o. female presents to establish care.  Previous PCP- [facility].   Discussed the use of AI scribe software for clinical note transcription with the patient, who gave verbal consent to proceed.  History of Present Illness She has been managing ADHD, anxiety, and depression for several years. She was diagnosed with ADHD at [facility] after testing and started on Adderall, which made an immediate positive difference, especially as she is enrolled in college. She is currently taking Adderall 10 mg once a day and finds it effective.  For anxiety and depression, she is taking fluvoxamine  25 mg once a day and bupropion  300 mg once a day, which are helping manage her symptoms. She also takes Atarax  (hydroxyzine ) 50 mg as needed, approximately once a week or a couple of times a month, primarily for sleep issues on particularly bad days.  She has been diagnosed with OCD symptoms, which were identified by a previous provider, Angeline, who noted that her obsessive thoughts were not limited to cleanliness but also affected her relationships, particularly due to past marital issues.  She has a history of anemia. Currently managed on Ferrous sulfate . No recent labs.   Outpatient Encounter Medications as of 11/06/2023  Medication Sig   amphetamine-dextroamphetamine (ADDERALL XR) 10 MG 24 hr capsule Take 10 mg by mouth 2 (two) times daily.   buPROPion  (WELLBUTRIN  XL) 300 MG 24 hr tablet TAKE 1 TABLET BY MOUTH EVERY DAY   Ferrous Sulfate  (IRON ) 325 (65 Fe) MG TABS Take 1 tablet by mouth daily.   fluvoxaMINE  (LUVOX ) 25 MG tablet TAKE 1 TABLET BY MOUTH TWICE A DAY    hydrOXYzine  (ATARAX ) 50 MG tablet TAKE 1 TABLET BY MOUTH EVERY DAY AS NEEDED   Misc Natural Products (T-RELIEF CBD+13 SL) Place under the tongue.   Multiple Vitamin (MULTIVITAMIN) tablet Take 1 tablet by mouth daily.   [DISCONTINUED] guanFACINE  (INTUNIV ) 1 MG TB24 ER tablet TAKE 1 TABLET BY MOUTH EVERY DAY   [DISCONTINUED] Vitamin D , Ergocalciferol , (DRISDOL ) 1.25 MG (50000 UNIT) CAPS capsule TAKE 1 CAPSULE BY MOUTH ONCE A WEEK. FOR 12 WEEKS. THEN START OTC VITAMIN D3 2,000 UNIT DAILY.   No facility-administered encounter medications on file as of 11/06/2023.    Past Medical History:  Diagnosis Date   Anemia    Anxiety    takes prozac    Asthma    as a child   Depression    Iron  deficiency anemia due to chronic blood loss 07/23/2020   Urinary tract infection     Past Surgical History:  Procedure Laterality Date   APPENDECTOMY     DILATION AND CURETTAGE OF UTERUS     LAPAROSCOPIC APPENDECTOMY N/A 03/22/2018   Procedure: APPENDECTOMY LAPAROSCOPIC;  Surgeon: Eletha Boas, MD;  Location: [facility];  Service: General;  Laterality: N/A;    Family History  Problem Relation Age of Onset   Diabetes Maternal Grandmother    Diabetes Maternal Grandfather    Diabetes Paternal Grandmother    Diabetes Paternal Grandfather     Social History   Socioeconomic History   Marital  status: Single    Spouse name: Not on file   Number of children: Not on file   Years of education: Not on file   Highest education level: Some college, no degree  Occupational History   Not on file  Tobacco Use   Smoking status: Never   Smokeless tobacco: Never  Vaping Use   Vaping status: Never Used  Substance and Sexual Activity   Alcohol use: No   Drug use: No   Sexual activity: Yes    Birth control/protection: None  Other Topics Concern   Not on file  Social History Narrative   Not on file   Social Drivers of Health   Financial Resource Strain: High Risk (08/25/2022)   Overall Financial Resource Strain  (CARDIA)    Difficulty of Paying Living Expenses: Hard  Food Insecurity: Food Insecurity Present (08/25/2022)   Hunger Vital Sign    Worried About Running Out of Food in the Last Year: Sometimes true    Ran Out of Food in the Last Year: Never true  Transportation Needs: No Transportation Needs (08/25/2022)   PRAPARE - Administrator, Civil Service (Medical): No    Lack of Transportation (Non-Medical): No  Physical Activity: Insufficiently Active (08/25/2022)   Exercise Vital Sign    Days of Exercise per Week: 2 days    Minutes of Exercise per Session: 20 min  Stress: Stress Concern Present (08/25/2022)   [employer] of Occupational Health - Occupational Stress Questionnaire    Feeling of Stress : Very much  Social Connections: Moderately Integrated (08/25/2022)   Social Connection and Isolation Panel    Frequency of Communication with Friends and Family: Twice a week    Frequency of Social Gatherings with Friends and Family: Once a week    Attends Religious Services: More than 4 times per year    Active Member of [employer] or Organizations: Yes    Attends Banker Meetings: 1 to 4 times per year    Marital Status: Divorced  Intimate Partner Violence: Not At Risk (08/18/2018)   Humiliation, Afraid, Rape, and Kick questionnaire    Fear of Current or Ex-Partner: No    Emotionally Abused: No    Physically Abused: No    Sexually Abused: No    Review of Systems  Constitutional:  Negative for chills and fever.  Respiratory:  Negative for shortness of breath.   Cardiovascular:  Negative for chest pain.  Gastrointestinal:  Negative for abdominal pain, constipation, diarrhea, heartburn, nausea and vomiting.  Genitourinary:  Negative for dysuria, frequency and urgency.  Neurological:  Negative for dizziness and headaches.  Endo/Heme/Allergies:  Negative for polydipsia.  Psychiatric/Behavioral:  Positive for depression. Negative for suicidal ideas. The patient has  insomnia. The patient is not nervous/anxious.         Objective    BP 120/78   Pulse 91   Temp 98 F (36.7 C) (Oral)   Ht 5' 2.15 (1.579 m)   Wt 108 lb (49 kg)   LMP 11/02/2023 (Exact Date)   SpO2 99%   BMI 19.66 kg/m   Physical Exam Vitals and nursing note reviewed.  Constitutional:      Appearance: Normal appearance.  Cardiovascular:     Rate and Rhythm: Normal rate and regular rhythm.     Pulses: Normal pulses.     Heart sounds: Normal heart sounds.  Pulmonary:     Effort: Pulmonary effort is normal.     Breath sounds: Normal breath sounds.  Neurological:     Mental Status: She is alert and oriented to person, place, and time.  Psychiatric:        Mood and Affect: Mood normal.        Behavior: Behavior normal.        Thought Content: Thought content normal.        Judgment: Judgment normal.         Assessment & Plan:  Anxiety and depression -     Ambulatory referral to Psychiatry  Establishing care with new doctor, encounter for Assessment & Plan: EMR reviewed briefly.    Attention deficit hyperactivity disorder (ADHD), unspecified ADHD type -     Ambulatory referral to Psychiatry  Other obsessive-compulsive disorders -     Ambulatory referral to Psychiatry    Assessment and Plan Assessment & Plan Attention-deficit hyperactivity disorder (ADHD) ADHD confirmed by testing at [facility], managed with Adderall 10 mg daily, significant symptom improvement. Transitioning care to [facility]. - Obtain ADHD records from [facility]. - Continue Adderall 10 mg daily until transition.  Generalized anxiety disorder and obsessive-compulsive disorder (OCD) Anxiety managed with fluvoxamine  and hydroxyzine , good symptom control. OCD symptoms are obsessive thoughts. - Continue fluvoxamine  25 mg daily. - Use hydroxyzine  as needed for acute anxiety.  Major depressive disorder Depression managed with bupropion  300 mg daily, symptoms  well-controlled. - Continue bupropion  300 mg daily. - referral placed for psychiatry.    Return in about 4 weeks (around 12/04/2023) for physical, pap and fasting labs.SABRA Carrol Aurora, NP

## 2023-11-06 NOTE — Patient Instructions (Addendum)
 You will either be contacted via phone regarding your referral to [facility] , or you may receive a letter on your MyChart portal from our referral team with instructions for scheduling an appointment. Please let us  know if you have not been contacted by anyone within two weeks.  Continue mediations as prescribed.   Let me know if you need any refills.  As discussed I will not be able to fill certain medications until I have your records from [facility] center.   Follow up in 4 weeks for physical and pap smear and labs.   It was a pleasure to meet you today! Please don't hesitate to contact me with any questions. Welcome to [facility]!

## 2023-12-04 ENCOUNTER — Other Ambulatory Visit (HOSPITAL_COMMUNITY)
Admission: RE | Admit: 2023-12-04 | Discharge: 2023-12-04 | Disposition: A | Discharge status: Home / Self Care | Source: Ambulatory Visit | Attending: General Practice | Admitting: General Practice

## 2023-12-04 ENCOUNTER — Ambulatory Visit (INDEPENDENT_AMBULATORY_CARE_PROVIDER_SITE_OTHER): Admitting: General Practice

## 2023-12-04 ENCOUNTER — Ambulatory Visit: Payer: Self-pay | Admitting: General Practice

## 2023-12-04 ENCOUNTER — Encounter: Payer: Self-pay | Admitting: General Practice

## 2023-12-04 VITALS — BP 118/82 | HR 97 | Temp 97.9°F | Ht 62.15 in | Wt 107.6 lb

## 2023-12-04 DIAGNOSIS — Z0000 Encounter for general adult medical examination without abnormal findings: Secondary | ICD-10-CM

## 2023-12-04 DIAGNOSIS — D500 Iron deficiency anemia secondary to blood loss (chronic): Secondary | ICD-10-CM | POA: Diagnosis not present

## 2023-12-04 DIAGNOSIS — Z124 Encounter for screening for malignant neoplasm of cervix: Secondary | ICD-10-CM | POA: Insufficient documentation

## 2023-12-04 DIAGNOSIS — F428 Other obsessive-compulsive disorder: Secondary | ICD-10-CM

## 2023-12-04 DIAGNOSIS — F419 Anxiety disorder, unspecified: Secondary | ICD-10-CM

## 2023-12-04 DIAGNOSIS — Z1159 Encounter for screening for other viral diseases: Secondary | ICD-10-CM | POA: Diagnosis not present

## 2023-12-04 DIAGNOSIS — F32A Depression, unspecified: Secondary | ICD-10-CM

## 2023-12-04 DIAGNOSIS — E559 Vitamin D deficiency, unspecified: Secondary | ICD-10-CM | POA: Diagnosis not present

## 2023-12-04 DIAGNOSIS — F909 Attention-deficit hyperactivity disorder, unspecified type: Secondary | ICD-10-CM

## 2023-12-04 LAB — COMPREHENSIVE METABOLIC PANEL WITH GFR
ALT: 12 U/L (ref 0–35)
AST: 12 U/L (ref 0–37)
Albumin: 4.4 g/dL (ref 3.5–5.2)
Alkaline Phosphatase: 48 U/L (ref 39–117)
BUN: 20 mg/dL (ref 6–23)
CO2: 27 meq/L (ref 19–32)
Calcium: 9.5 mg/dL (ref 8.4–10.5)
Chloride: 102 meq/L (ref 96–112)
Creatinine, Ser: 0.8 mg/dL (ref 0.40–1.20)
GFR: 95.27 mL/min (ref >60.00)
Glucose, Bld: 99 mg/dL (ref 70–99)
Potassium: 4.1 meq/L (ref 3.5–5.1)
Sodium: 137 meq/L (ref 135–145)
Total Bilirubin: 0.4 mg/dL (ref 0.2–1.2)
Total Protein: 7.1 g/dL (ref 6.0–8.3)

## 2023-12-04 LAB — LIPID PANEL
Cholesterol: 168 mg/dL (ref 0–200)
HDL: 53.5 mg/dL (ref >39.00)
LDL Cholesterol: 91 mg/dL (ref 0–99)
NonHDL: 114.68
Total CHOL/HDL Ratio: 3
Triglycerides: 116 mg/dL (ref 0.0–149.0)
VLDL: 23.2 mg/dL (ref 0.0–40.0)

## 2023-12-04 LAB — VITAMIN D 25 HYDROXY (VIT D DEFICIENCY, FRACTURES): VITD: 29.56 ng/mL — ABNORMAL LOW (ref 30.00–100.00)

## 2023-12-04 LAB — CBC
HCT: 40 % (ref 36.0–46.0)
Hemoglobin: 13 g/dL (ref 12.0–15.0)
MCHC: 32.4 g/dL (ref 30.0–36.0)
MCV: 81.5 fl (ref 78.0–100.0)
Platelets: 292 K/uL (ref 150.0–400.0)
RBC: 4.91 Mil/uL (ref 3.87–5.11)
RDW: 14.2 % (ref 11.5–15.5)
WBC: 4.2 K/uL (ref 4.0–10.5)

## 2023-12-04 LAB — IBC + FERRITIN
Ferritin: 19.1 ng/mL (ref 10.0–291.0)
Iron: 108 ug/dL (ref 42–145)
Saturation Ratios: 26 % (ref 20.0–50.0)
TIBC: 415.8 ug/dL (ref 250.0–450.0)
Transferrin: 297 mg/dL (ref 212.0–360.0)

## 2023-12-04 LAB — TSH: TSH: 1.29 u[IU]/mL (ref 0.35–5.50)

## 2023-12-04 NOTE — Assessment & Plan Note (Signed)
 Following with [facility].

## 2023-12-04 NOTE — Patient Instructions (Addendum)
 Stop by the lab prior to leaving today. I will notify you of your results once received.   You will either be contacted via phone regarding your referral to psychiatry , or you may receive a letter on your MyChart portal from our referral team with instructions for scheduling an appointment. Please let us  know if you have not been contacted by anyone within two weeks.  Schedule physical for next year.   It was a pleasure to see you today!

## 2023-12-04 NOTE — Progress Notes (Signed)
 Established Patient Office Visit  Subjective   Patient ID: Lauren Griffith, female    DOB: 11-Dec-1990  Age: 33 y.o. MRN: [id number]  Chief Complaint  Patient presents with   Annual Exam    Labs and pap    HPI  Chistine Griffith is a 33 year old female with past medical history of anxiety, depression, IDA, OCD, resless legs syndrome, OCD, insomnia, ADHD presents today for complete physical and follow up of chronic conditions.  Immunizations: -Tetanus: Completed in 2021 -Influenza: due, declines -HPV: unsure  Diet: Fair diet.  Exercise:  regular exercise- walks daily  Eye exam: Completed several years ago. Dental exam: Completes semi-annually    Pap Smear: Completed in 2019; due   Patient Active Problem List   Diagnosis Date Noted   Encounter for screening and preventative care 12/04/2023   Establishing care with new doctor, encounter for 11/06/2023   Attention deficit hyperactivity disorder (ADHD) 11/06/2023   Insomnia due to other mental disorder 08/25/2022   OCD (obsessive compulsive disorder) 02/12/2022   Restless legs syndrome 06/24/2021   Iron  deficiency anemia due to chronic blood loss 07/23/2020   Anxiety and depression 01/16/2014   Past Medical History:  Diagnosis Date   Anemia    Anxiety    takes prozac    Asthma    as a child   Depression    Iron  deficiency anemia due to chronic blood loss 07/23/2020   Urinary tract infection    Past Surgical History:  Procedure Laterality Date   APPENDECTOMY     DILATION AND CURETTAGE OF UTERUS     LAPAROSCOPIC APPENDECTOMY N/A 03/22/2018   Procedure: APPENDECTOMY LAPAROSCOPIC;  Surgeon: Lauren Boas, MD;  Location: [facility];  Service: General;  Laterality: N/A;   Allergies  Allergen Reactions   Cinnamon Swelling and Other (See Comments)    Mouth swelling. Pt states it is a minor allergy          12/04/2023    9:35 AM 11/06/2023    9:35 AM 08/25/2022    1:59 PM  Depression screen PHQ 2/9  Decreased Interest 1 1 3    Down, Depressed, Hopeless 1 1 3   PHQ - 2 Score 2 2 6   Altered sleeping 1 2 3   Tired, decreased energy 1 1 3   Change in appetite 0 0 3  Feeling bad or failure about yourself  0 0 2  Trouble concentrating 0 0 2  Moving slowly or fidgety/restless 0 0 3  Suicidal thoughts 0 0 0  PHQ-9 Score 4 5 22   Difficult doing work/chores Very difficult Somewhat difficult Extremely dIfficult       12/04/2023    9:35 AM 11/06/2023    9:36 AM 08/25/2022    1:59 PM 02/12/2022    8:50 AM  GAD 7 : Generalized Anxiety Score  Nervous, Anxious, on Edge 1 1 3 3   Control/stop worrying 2 0 3 3  Worry too much - different things 2 0 3 3  Trouble relaxing 2 1 3 3   Restless 0 0 3 1  Easily annoyed or irritable 2 0 3 2  Afraid - awful might happen 0 0 3 2  Total GAD 7 Score 9 2 21 17   Anxiety Difficulty Very difficult Not difficult at all Very difficult Somewhat difficult      Review of Systems  Constitutional:  Negative for chills, fever, malaise/fatigue and weight loss.  HENT:  Negative for congestion, ear discharge, ear pain, hearing loss, nosebleeds, sinus pain, sore  throat and tinnitus.   Eyes:  Negative for blurred vision, double vision, pain, discharge and redness.  Respiratory:  Negative for cough, shortness of breath, wheezing and stridor.   Cardiovascular:  Negative for chest pain, palpitations and leg swelling.  Gastrointestinal:  Negative for abdominal pain, constipation, diarrhea, heartburn, nausea and vomiting.  Genitourinary:  Negative for dysuria, frequency and urgency.  Musculoskeletal:  Negative for myalgias.  Skin:  Negative for rash.  Neurological:  Negative for dizziness, tingling, seizures, weakness and headaches.  Psychiatric/Behavioral:  Negative for depression, substance abuse and suicidal ideas. The patient is not nervous/anxious.       Objective:     BP 118/82   Pulse 97   Temp 97.9 F (36.6 C) (Oral)   Ht 5' 2.15 (1.579 m)   Wt 107 lb 9.6 oz (48.8 kg)   LMP  11/02/2023 (Exact Date)   SpO2 97%   BMI 19.59 kg/m  BP Readings from Last 3 Encounters:  12/04/23 118/82  11/06/23 120/78  08/25/22 104/60   Wt Readings from Last 3 Encounters:  12/04/23 107 lb 9.6 oz (48.8 kg)  11/06/23 108 lb (49 kg)  08/25/22 118 lb (53.5 kg)      Physical Exam Vitals and nursing note reviewed.  Constitutional:      Appearance: Normal appearance.  HENT:     Head: Normocephalic and atraumatic.     Right Ear: Tympanic membrane, ear canal and external ear normal.     Left Ear: Tympanic membrane, ear canal and external ear normal.     Nose: Nose normal.     Mouth/Throat:     Mouth: Mucous membranes are moist.     Pharynx: Oropharynx is clear.  Eyes:     Conjunctiva/sclera: Conjunctivae normal.     Pupils: Pupils are equal, round, and reactive to light.  Cardiovascular:     Rate and Rhythm: Normal rate and regular rhythm.     Pulses: Normal pulses.     Heart sounds: Normal heart sounds.  Pulmonary:     Effort: Pulmonary effort is normal.     Breath sounds: Normal breath sounds.  Abdominal:     General: Abdomen is flat. Bowel sounds are normal.     Palpations: Abdomen is soft.  Musculoskeletal:        General: Normal range of motion.     Cervical back: Normal range of motion.  Skin:    General: Skin is warm and dry.     Capillary Refill: Capillary refill takes less than 2 seconds.  Neurological:     General: No focal deficit present.     Mental Status: She is alert and oriented to person, place, and time. Mental status is at baseline.  Psychiatric:        Mood and Affect: Mood normal.        Behavior: Behavior normal.        Thought Content: Thought content normal.        Judgment: Judgment normal.      No results found for any visits on 12/04/23.     The ASCVD Risk score (Arnett DK, et al., 2019) failed to calculate for the following reasons:   The 2019 ASCVD risk score is only valid for ages 85 to 34    Assessment & Plan:  Encounter  for screening and preventative care Assessment & Plan: Immunizations tdap utd; declines influenza vaccine. Pap smear due; completed today.   Discussed the importance of a healthy diet and regular exercise  in order for weight loss, and to reduce the risk of further co-morbidity.  Exam stable. Labs pending.  Follow up in 1 year for repeat physical.   Orders: -     CBC -     Comprehensive metabolic panel with GFR -     Lipid panel -     TSH  Screening for cervical cancer -     Cytology - PAP  Encounter for hepatitis C screening test for low risk patient -     Hepatitis C antibody  Attention deficit hyperactivity disorder (ADHD), unspecified ADHD type Assessment & Plan: Following with [facility].  Orders: -     Ambulatory referral to Psychiatry  Other obsessive-compulsive disorders Assessment & Plan: Following with [facility].  Orders: -     Ambulatory referral to Psychiatry  Anxiety and depression Assessment & Plan: Following with [facility].  Orders: -     Ambulatory referral to Psychiatry     No follow-ups on file.    Carrol Aurora, NP

## 2023-12-04 NOTE — Assessment & Plan Note (Signed)
 Immunizations tdap utd; declines influenza vaccine. Pap smear due; completed today.   Discussed the importance of a healthy diet and regular exercise in order for weight loss, and to reduce the risk of further co-morbidity.  Exam stable. Labs pending.  Follow up in 1 year for repeat physical.

## 2023-12-05 LAB — HEPATITIS C ANTIBODY: Hepatitis C Ab: NONREACTIVE

## 2023-12-08 LAB — CYTOLOGY - PAP
Adequacy: ABSENT
Comment: NEGATIVE
Diagnosis: NEGATIVE
Diagnosis: REACTIVE
High risk HPV: NEGATIVE

## 2024-01-21 ENCOUNTER — Ambulatory Visit: Payer: Self-pay

## 2024-01-21 NOTE — Telephone Encounter (Signed)
  FYI Only or Action Required?: FYI only for provider: appointment scheduled on 01/22/2024.  Patient was last seen in primary care on 12/04/2023 by Vincente Shivers, NP.  Called Nurse Triage reporting Insect Bite.  Symptoms began several days ago.  Interventions attempted: OTC medications: Ibuprofen .  Symptoms are: gradually worsening.  Triage Disposition: See Physician Within 24 Hours  Patient/caregiver understands and will follow disposition?: Yes       Copied from CRM #[id number]. Topic: Clinical - Red Word Triage >> Jan 21, 2024  2:31 PM China J wrote: [facility] that prompted transfer to Nurse Triage: The patient has a spider bite on her buttocks that is very red and sore. It also looks discolored. Reason for Disposition  [1] Red or very tender (to touch) area AND [2] started over 24 hours after the bite  Answer Assessment - Initial Assessment Questions 1. TYPE of INSECT: What type of insect was it?      Spider  2. ONSET: When did you get bitten?      Tuesday  3. LOCATION: Where is the insect bite located?      On buttocks area  4. REDNESS: Is the area red or pink? If Yes, ask: What size is the area of redness? (inches or cm). When did the redness start?     Redness not improving  5. PAIN: Is there any pain? If Yes, ask: How bad is the pain? (Scale 0-10; or none, mild, moderate, severe)     Mild  6. ITCHING: Does it itch? If Yes, ask: How bad is the itch?      Denies  7. SWELLING: How big is the swelling? (e.g., inches, cm, or compare to coins)     Denies  8. OTHER SYMPTOMS: Do you have any other symptoms?  (e.g., difficulty breathing, fever, hives)    Last 3 days a bad headache   Taking ibuprofen  for headache symptom.  Protocols used: Insect Bite-A-AH

## 2024-01-22 ENCOUNTER — Ambulatory Visit: Payer: Self-pay | Admitting: General Practice

## 2024-01-22 ENCOUNTER — Ambulatory Visit: Admitting: General Practice

## 2024-01-22 ENCOUNTER — Encounter: Payer: Self-pay | Admitting: General Practice

## 2024-01-22 VITALS — BP 110/80 | HR 120 | Temp 98.3°F | Ht 62.15 in | Wt 106.0 lb

## 2024-01-22 DIAGNOSIS — S20469A Insect bite (nonvenomous) of unspecified back wall of thorax, initial encounter: Secondary | ICD-10-CM | POA: Diagnosis not present

## 2024-01-22 DIAGNOSIS — W57XXXA Bitten or stung by nonvenomous insect and other nonvenomous arthropods, initial encounter: Secondary | ICD-10-CM

## 2024-01-22 DIAGNOSIS — R300 Dysuria: Secondary | ICD-10-CM | POA: Diagnosis not present

## 2024-01-22 LAB — POC URINALSYSI DIPSTICK (AUTOMATED)
Bilirubin, UA: NEGATIVE
Blood, UA: 80
Glucose, UA: NEGATIVE
Ketones, UA: NEGATIVE
Nitrite, UA: NEGATIVE
Protein, UA: NEGATIVE
Spec Grav, UA: 1.02 (ref 1.010–1.025)
Urobilinogen, UA: 0.2 U/dL
pH, UA: 5.5 (ref 5.0–8.0)

## 2024-01-22 MED ORDER — CEPHALEXIN 500 MG PO CAPS: 1000.0000 mg | ORAL_CAPSULE | Freq: Two times a day (BID) | ORAL | 0 refills | Status: AC

## 2024-01-22 NOTE — Progress Notes (Signed)
 Established Patient Office Visit  Subjective   Patient ID: Lauren Griffith, female    DOB: 27-Sep-1990  Age: 33 y.o. MRN: [id number]  Chief Complaint  Patient presents with   Insect Bite    Thinks she got a spider bite after sitting on her front porch on Tuesday. Patient states its been very itchy and burned when she took a shower. Patient states now its very sore and red. Patient has been applying neosporin to area.      HPI  [name] is a 33  year old female with past medical history   Discussed the use of AI scribe software for clinical note transcription with the patient, who gave verbal consent to proceed.  History of Present Illness [name] is a 33 year old female who presents with a suspected spider bite on her right buttock.  She noticed itching on her right buttock after sitting on her front porch, which she scratched. Later that evening, she experienced intense burning pain when water hit the area during a shower. The bite is located near the gluteal cleft and is described as very sore and tender, but not itchy. She is unsure if there is a rash, as she can only see the area in a mirror and finds it difficult to assess, but her husband noted redness. No fever, chills, chest pain, or shortness of breath.  She has experienced headaches, which she attributes to seasonal changes, and reports a fast heart rate, though her blood pressure remains normal.  She reports urinary symptoms resembling a urinary tract infection, including urgency and minimal output, which began earlier in the week before the suspected bite. She has been trying to increase her fluid intake with water and cranberry juice, but symptoms persist. She denies bowel symptoms such as diarrhea or constipation.    Patient Active Problem List   Diagnosis Date Noted   Dysuria 01/22/2024   Bite, insect 01/22/2024   Encounter for screening and preventative care 12/04/2023   Establishing care with new  doctor, encounter for 11/06/2023   Attention deficit hyperactivity disorder (ADHD) 11/06/2023   Insomnia due to other mental disorder 08/25/2022   OCD (obsessive compulsive disorder) 02/12/2022   Restless legs syndrome 06/24/2021   Iron  deficiency anemia due to chronic blood loss 07/23/2020   Anxiety and depression 01/16/2014   Past Medical History:  Diagnosis Date   Anemia    Anxiety    takes prozac    Asthma    as a child   Depression    Iron  deficiency anemia due to chronic blood loss 07/23/2020   Urinary tract infection    Past Surgical History:  Procedure Laterality Date   APPENDECTOMY     DILATION AND CURETTAGE OF UTERUS     LAPAROSCOPIC APPENDECTOMY N/A 03/22/2018   Procedure: APPENDECTOMY LAPAROSCOPIC;  Surgeon: Eletha Boas, MD;  Location: [facility];  Service: General;  Laterality: N/A;   Allergies  Allergen Reactions   Cinnamon Swelling and Other (See Comments)    Mouth swelling. Pt states it is a minor allergy          01/22/2024   11:49 AM 12/04/2023    9:35 AM 11/06/2023    9:35 AM  Depression screen PHQ 2/9  Decreased Interest 1 1 1   Down, Depressed, Hopeless 1 1 1   PHQ - 2 Score 2 2 2   Altered sleeping 1 1 2   Tired, decreased energy 1 1 1   Change in appetite 1 0 0  Feeling bad or failure about yourself  2 0 0  Trouble concentrating 2 0 0  Moving slowly or fidgety/restless 0 0 0  Suicidal thoughts 0 0 0  PHQ-9 Score 9 4  5    Difficult doing work/chores Somewhat difficult Very difficult Somewhat difficult     Data saved with a previous flowsheet row definition       01/22/2024   11:49 AM 12/04/2023    9:35 AM 11/06/2023    9:36 AM 08/25/2022    1:59 PM  GAD 7 : Generalized Anxiety Score  Nervous, Anxious, on Edge 1 1 1 3   Control/stop worrying 1 2 0 3  Worry too much - different things 1 2 0 3  Trouble relaxing 1 2 1 3   Restless 1 0 0 3  Easily annoyed or irritable 2 2 0 3  Afraid - awful might happen 0 0 0 3  Total GAD 7 Score 7 9 2 21   Anxiety  Difficulty Somewhat difficult Very difficult Not difficult at all Very difficult      Review of Systems  Constitutional:  Negative for chills and fever.  Respiratory:  Negative for shortness of breath.   Cardiovascular:  Negative for chest pain.  Gastrointestinal:  Negative for abdominal pain, constipation, diarrhea, heartburn, nausea and vomiting.  Genitourinary:  Positive for dysuria, frequency and urgency.  Skin:  Negative for itching and rash.  Neurological:  Negative for dizziness and headaches.  Endo/Heme/Allergies:  Negative for polydipsia.  Psychiatric/Behavioral:  Negative for depression and suicidal ideas. The patient is not nervous/anxious.       Objective:     BP 110/80   Pulse (!) 120   Temp 98.3 F (36.8 C) (Oral)   Ht 5' 2.15 (1.579 m)   Wt 106 lb (48.1 kg)   LMP 01/06/2024 (Exact Date)   SpO2 96%   BMI 19.29 kg/m  BP Readings from Last 3 Encounters:  01/22/24 110/80  12/04/23 118/82  11/06/23 120/78   Wt Readings from Last 3 Encounters:  01/22/24 106 lb (48.1 kg)  12/04/23 107 lb 9.6 oz (48.8 kg)  11/06/23 108 lb (49 kg)     Physical Exam Vitals and nursing note reviewed.  Constitutional:      Appearance: Normal appearance.  Cardiovascular:     Rate and Rhythm: Normal rate and regular rhythm.     Pulses: Normal pulses.     Heart sounds: Normal heart sounds.  Pulmonary:     Effort: Pulmonary effort is normal.     Breath sounds: Normal breath sounds.  Skin:     Neurological:     Mental Status: She is alert and oriented to person, place, and time.  Psychiatric:        Mood and Affect: Mood normal.        Behavior: Behavior normal.        Thought Content: Thought content normal.        Judgment: Judgment normal.      Results for orders placed or performed in visit on 01/22/24  POCT Urinalysis Dipstick (Automated)  Result Value Ref Range   Color, UA yellow    Clarity, UA cloudy    Glucose, UA Negative Negative   Bilirubin, UA neg     Ketones, UA neg    Spec Grav, UA 1.020 1.010 - 1.025   Blood, UA 80 ery/ul    pH, UA 5.5 5.0 - 8.0   Protein, UA Negative Negative   Urobilinogen, UA 0.2 0.2 or  1.0 E.U./dL   Nitrite, UA neg    Leukocytes, UA Trace (A) Negative       The ASCVD Risk score (Arnett DK, et al., 2019) failed to calculate for the following reasons:   The 2019 ASCVD risk score is only valid for ages 81 to 84    Assessment & Plan:  Dysuria -     POCT Urinalysis Dipstick (Automated)  Insect bite, unspecified site, initial encounter -     Cephalexin ; Take 2 capsules (1,000 mg total) by mouth 2 (two) times daily for 5 days.  Dispense: 20 capsule; Refill: 0     Assessment and Plan Assessment & Plan Skin lesion of right buttock, possible insect or spider bite - Given presentation, will treat.  - Start Keflex  1000 mg BID x 5 days.  - RX sent.  Urinary symptoms, possible urinary tract infection POC UA shows trace leukocytes, blood. Negative for nitrites.  - urine culture sent.  - Start Keflex  1000 mg BID x 5 days.  - encouraged to increase fluid intake, continue cranberry juice  - ER precautions given.     Return if symptoms worsen or fail to improve.    Carrol Aurora, NP

## 2024-01-22 NOTE — Telephone Encounter (Signed)
 Noted

## 2024-01-22 NOTE — Patient Instructions (Signed)
 Start Keflex  1000 mg twice daily for five days.   Update me if your symptoms worsen or do not improve.   As discussed, please go to the ER if you develop chest pain, shortness of breath or difficulty breathing.   It was a pleasure to see you today!

## 2024-01-23 LAB — URINE CULTURE
MICRO NUMBER:: 17206677
Result:: NO GROWTH
SPECIMEN QUALITY:: ADEQUATE

## 2024-01-29 ENCOUNTER — Ambulatory Visit (INDEPENDENT_AMBULATORY_CARE_PROVIDER_SITE_OTHER): Admitting: General Practice

## 2024-01-29 ENCOUNTER — Encounter: Payer: Self-pay | Admitting: General Practice

## 2024-01-29 VITALS — BP 118/70 | HR 76 | Temp 98.5°F | Ht 62.15 in | Wt 109.0 lb

## 2024-01-29 DIAGNOSIS — N898 Other specified noninflammatory disorders of vagina: Secondary | ICD-10-CM

## 2024-01-29 DIAGNOSIS — S20469A Insect bite (nonvenomous) of unspecified back wall of thorax, initial encounter: Secondary | ICD-10-CM | POA: Diagnosis not present

## 2024-01-29 DIAGNOSIS — W57XXXA Bitten or stung by nonvenomous insect and other nonvenomous arthropods, initial encounter: Secondary | ICD-10-CM

## 2024-01-29 MED ORDER — CEPHALEXIN 500 MG PO CAPS: 1000.0000 mg | ORAL_CAPSULE | Freq: Two times a day (BID) | ORAL | 0 refills | Status: AC

## 2024-01-29 NOTE — Patient Instructions (Addendum)
 Start Keflex  1000 mg twice daily for five days.  As discussed avoid neosporin.  Wash area with warm water and antibacterial soap.  Keep area dry and open to air.   Keep me posted on your symptoms.   It was a pleasure to see you today!

## 2024-01-29 NOTE — Progress Notes (Signed)
 Established Patient Office Visit  Subjective   Patient ID: BARBERA PERRITT, female    DOB: 08/16/1990  Age: 33 y.o. MRN: [id number]  Chief Complaint  Patient presents with   Insect Bite    Patient is following up on bite and patient thinks bite is worse. Patient also has yeast infection now from abx.     HPI  SHELBYLYNN WALCZYK is a 33 year old female with past medical history of anxiety, depression, IDA, restless legs syndrome, OCD, ADHD, presents today for an acute visit to discuss spider bite.   She was evaluated on 01/22/24 for an insect bite that she suspected a spider. She denies any fever ,chills, flu like symptoms at the time. She was given Keflex  1000 mg BID for five days. She completed the course and sent a mychart message with pictures on 01/28/24 of the insect bite and that it was getting worse.   Today she presents for further evalutaion.   Discussed the use of AI scribe software for clinical note transcription with the patient, who gave verbal consent to proceed.  History of Present Illness SETAREH ROM is a 33 year old female who presents with concerns of a possible yeast infection and a persistent spider bite.  She is concerned about a spider bite that has not fully healed. Initially, the bite was tender, but it has progressed to being painful. She completed a five-day course of antibiotics, which seemed to improve the tenderness, but symptoms returned after finishing the medication. The bite is located in the left buttock area, which she notes is often moist and difficult to keep dry. No flu-like symptoms, fever, or chills associated with the spider bite. She is worried about potential necrosis. She has been applying Neosporin to the bite but stopped to see if it would dry out. The area remains sticky and moist, and she is concerned about the possibility of infection persisting.  She experiences symptoms of a yeast infection, including significant pruritus and a thicker,  clearish discharge. She typically develops a yeast infection following antibiotic use. Previously, she felt symptoms of a urinary tract infection, but a culture was negative. Despite this, she completed a course of antibiotics, which she believes may have led to the current yeast infection symptoms.She confirms pruritus and discharge in the vaginal area.    Patient Active Problem List   Diagnosis Date Noted   Vaginal discharge 01/29/2024   Vaginal itching 01/29/2024   Dysuria 01/22/2024   Bite, insect 01/22/2024   Encounter for screening and preventative care 12/04/2023   Establishing care with new doctor, encounter for 11/06/2023   Attention deficit hyperactivity disorder (ADHD) 11/06/2023   Insomnia due to other mental disorder 08/25/2022   OCD (obsessive compulsive disorder) 02/12/2022   Restless legs syndrome 06/24/2021   Iron  deficiency anemia due to chronic blood loss 07/23/2020   Anxiety and depression 01/16/2014   Past Medical History:  Diagnosis Date   Anemia    Anxiety    takes prozac    Asthma    as a child   Depression    Iron  deficiency anemia due to chronic blood loss 07/23/2020   Urinary tract infection    Past Surgical History:  Procedure Laterality Date   APPENDECTOMY     DILATION AND CURETTAGE OF UTERUS     LAPAROSCOPIC APPENDECTOMY N/A 03/22/2018   Procedure: APPENDECTOMY LAPAROSCOPIC;  Surgeon: Eletha Boas, MD;  Location: [facility];  Service: General;  Laterality: N/A;   Allergies  Allergen  Reactions   Cinnamon Swelling and Other (See Comments)    Mouth swelling. Pt states it is a minor allergy          01/29/2024    8:46 AM 01/22/2024   11:49 AM 12/04/2023    9:35 AM  Depression screen PHQ 2/9  Decreased Interest 1 1 1   Down, Depressed, Hopeless 1 1 1   PHQ - 2 Score 2 2 2   Altered sleeping 1 1 1   Tired, decreased energy 1 1 1   Change in appetite 1 1 0  Feeling bad or failure about yourself  2 2 0  Trouble concentrating 2 2 0  Moving slowly or  fidgety/restless 0 0 0  Suicidal thoughts 0 0 0  PHQ-9 Score 9 9 4    Difficult doing work/chores Somewhat difficult Somewhat difficult Very difficult     Data saved with a previous flowsheet row definition       01/29/2024    8:47 AM 01/22/2024   11:49 AM 12/04/2023    9:35 AM 11/06/2023    9:36 AM  GAD 7 : Generalized Anxiety Score  Nervous, Anxious, on Edge 1 1 1 1   Control/stop worrying 1 1 2  0  Worry too much - different things 1 1 2  0  Trouble relaxing 1 1 2 1   Restless 1 1 0 0  Easily annoyed or irritable 2 2 2  0  Afraid - awful might happen 0 0 0 0  Total GAD 7 Score 7 7 9 2   Anxiety Difficulty Somewhat difficult Somewhat difficult Very difficult Not difficult at all      Review of Systems  Constitutional:  Negative for chills and fever.  Respiratory:  Negative for shortness of breath.   Cardiovascular:  Negative for chest pain.  Gastrointestinal:  Negative for abdominal pain, constipation, diarrhea, heartburn, nausea and vomiting.  Genitourinary:  Negative for dysuria, frequency and urgency.       Vaginal itching, vaginal discharge.  Neurological:  Negative for dizziness and headaches.  Endo/Heme/Allergies:  Negative for polydipsia.  Psychiatric/Behavioral:  Negative for depression and suicidal ideas. The patient is not nervous/anxious.       Objective:     BP 118/70   Pulse 76   Temp 98.5 F (36.9 C) (Temporal)   Ht 5' 2.15 (1.579 m)   Wt 109 lb (49.4 kg)   LMP 01/06/2024 (Exact Date)   SpO2 99%   BMI 19.84 kg/m  BP Readings from Last 3 Encounters:  01/29/24 118/70  01/22/24 110/80  12/04/23 118/82   Wt Readings from Last 3 Encounters:  01/29/24 109 lb (49.4 kg)  01/22/24 106 lb (48.1 kg)  12/04/23 107 lb 9.6 oz (48.8 kg)      Physical Exam Vitals and nursing note reviewed.  Constitutional:      Appearance: Normal appearance.  Cardiovascular:     Rate and Rhythm: Normal rate and regular rhythm.     Pulses: Normal pulses.     Heart sounds:  Normal heart sounds.  Pulmonary:     Effort: Pulmonary effort is normal.     Breath sounds: Normal breath sounds.  Skin:     Neurological:     Mental Status: She is alert and oriented to person, place, and time.  Psychiatric:        Mood and Affect: Mood normal.        Behavior: Behavior normal.        Thought Content: Thought content normal.  Judgment: Judgment normal.      No results found for any visits on 01/29/24.     The ASCVD Risk score (Arnett DK, et al., 2019) failed to calculate for the following reasons:   The 2019 ASCVD risk score is only valid for ages 54 to 54    Assessment & Plan:  Insect bite, unspecified site, initial encounter -     Cephalexin ; Take 2 capsules (1,000 mg total) by mouth 2 (two) times daily for 5 days.  Dispense: 20 capsule; Refill: 0  Vaginal discharge -     WET PREP BY MOLECULAR PROBE  Vaginal itching -     WET PREP BY MOLECULAR PROBE   Assessment and Plan Assessment & Plan Insect bite with secondary skin infection Insect bite healing but moist and painful. Initial antibiotics insufficient. Differential includes brown recluse bite. No systemic symptoms or necrosis. - on exam, looks improved. No spreading. Slight erythematous. Given that symptoms were improving with the Keflex , will do five more days.  - Start Keflex  1000 mg BID for five days.  - Advised to avoid Neosporin and keep area open to air. Use warm water and antibacterial soap to clean.  - Consider specialist referral if no improvement after antibiotics.  Vaginal discharge and pruritus, evaluation for infection Symptoms suggest yeast infection, especially post-antibiotic use. Differential includes BV and trichomonas. Previous negative UTI culture. Wet prep planned. - Ordered wet prep to evaluate for yeast, BV, and trichomonas. - If yeast infection confirmed, prescribe Diflucan  (fluconazole ) 1 tablet. - await results     Return if symptoms worsen or fail to improve.     Carrol Aurora, NP

## 2024-02-01 ENCOUNTER — Ambulatory Visit: Payer: Self-pay | Admitting: General Practice

## 2024-02-01 DIAGNOSIS — B379 Candidiasis, unspecified: Secondary | ICD-10-CM

## 2024-02-01 LAB — WET PREP BY MOLECULAR PROBE
Candida species: DETECTED — AB
Gardnerella vaginalis: NOT DETECTED
MICRO NUMBER:: 17237241
SPECIMEN QUALITY:: ADEQUATE
Trichomonas vaginosis: NOT DETECTED

## 2024-02-01 MED ORDER — FLUCONAZOLE 150 MG PO TABS: 150.0000 mg | ORAL_TABLET | Freq: Once | ORAL | 0 refills | Status: AC
